# Patient Record
Sex: Female | Born: 1945 | Race: White | Hispanic: No | Marital: Married | Smoking: Former smoker
Health system: Southern US, Community
[De-identification: ages and names within clinical notes are randomized; demographics above are authoritative.]

## PROBLEM LIST (undated history)

## (undated) DIAGNOSIS — I499 Cardiac arrhythmia, unspecified: Secondary | ICD-10-CM

## (undated) DIAGNOSIS — E785 Hyperlipidemia, unspecified: Secondary | ICD-10-CM

## (undated) DIAGNOSIS — I4819 Other persistent atrial fibrillation: Secondary | ICD-10-CM

## (undated) DIAGNOSIS — G4733 Obstructive sleep apnea (adult) (pediatric): Secondary | ICD-10-CM

## (undated) DIAGNOSIS — F329 Major depressive disorder, single episode, unspecified: Secondary | ICD-10-CM

## (undated) DIAGNOSIS — N951 Menopausal and female climacteric states: Secondary | ICD-10-CM

## (undated) DIAGNOSIS — F32A Depression, unspecified: Secondary | ICD-10-CM

## (undated) DIAGNOSIS — I509 Heart failure, unspecified: Secondary | ICD-10-CM

## (undated) DIAGNOSIS — I10 Essential (primary) hypertension: Secondary | ICD-10-CM

## (undated) HISTORY — DX: Menopausal and female climacteric states: N95.1

## (undated) HISTORY — DX: Cardiac arrhythmia, unspecified: I49.9

## (undated) HISTORY — DX: Other persistent atrial fibrillation: I48.19

## (undated) HISTORY — DX: Obstructive sleep apnea (adult) (pediatric): G47.33

## (undated) HISTORY — DX: Heart failure, unspecified: I50.9

---
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
## 1970-06-19 HISTORY — PX: APPENDECTOMY: SHX54

## 1990-06-19 DIAGNOSIS — N951 Menopausal and female climacteric states: Secondary | ICD-10-CM

## 1990-06-19 HISTORY — DX: Menopausal and female climacteric states: N95.1

## 2005-12-19 ENCOUNTER — Emergency Department: Payer: Self-pay | Admitting: Emergency Medicine

## 2006-02-28 ENCOUNTER — Ambulatory Visit: Payer: Self-pay | Admitting: Family Medicine

## 2006-03-06 ENCOUNTER — Ambulatory Visit: Payer: Self-pay | Admitting: Family Medicine

## 2009-06-10 ENCOUNTER — Ambulatory Visit: Payer: Self-pay | Admitting: Family

## 2011-04-10 ENCOUNTER — Ambulatory Visit: Payer: Self-pay | Admitting: Internal Medicine

## 2011-05-08 ENCOUNTER — Ambulatory Visit (INDEPENDENT_AMBULATORY_CARE_PROVIDER_SITE_OTHER): Payer: Medicare Other | Admitting: Internal Medicine

## 2011-05-08 ENCOUNTER — Encounter: Payer: Self-pay | Admitting: Internal Medicine

## 2011-05-08 DIAGNOSIS — Z1239 Encounter for other screening for malignant neoplasm of breast: Secondary | ICD-10-CM

## 2011-05-08 DIAGNOSIS — Z124 Encounter for screening for malignant neoplasm of cervix: Secondary | ICD-10-CM

## 2011-05-08 DIAGNOSIS — R928 Other abnormal and inconclusive findings on diagnostic imaging of breast: Secondary | ICD-10-CM | POA: Insufficient documentation

## 2011-05-08 DIAGNOSIS — M130 Polyarthritis, unspecified: Secondary | ICD-10-CM

## 2011-05-08 DIAGNOSIS — Z1211 Encounter for screening for malignant neoplasm of colon: Secondary | ICD-10-CM | POA: Insufficient documentation

## 2011-05-08 DIAGNOSIS — Z13220 Encounter for screening for lipoid disorders: Secondary | ICD-10-CM

## 2011-05-08 DIAGNOSIS — N951 Menopausal and female climacteric states: Secondary | ICD-10-CM

## 2011-05-08 DIAGNOSIS — R5381 Other malaise: Secondary | ICD-10-CM

## 2011-05-08 DIAGNOSIS — R5383 Other fatigue: Secondary | ICD-10-CM

## 2011-05-08 NOTE — Patient Instructions (Addendum)
Try taking 2 ibuprofen and 1 or 2 tylenol 1 hour before bedtinme for your joint pain and suspend your turmeric to see if the hot flashes improve.  You can repeat this regimen 2 or 3 times daily depending on the strength of tylenol (Max dose of tylenol is 2 grams daily or  2000 mg ) .    Return for fasting labs at [facility].  Will are setting up your mammogram . GI referral and annual physical (after January)  Please go to [place] at [facility] for your chest x ray tomorrow after 2 pm

## 2011-05-08 NOTE — Progress Notes (Signed)
  Subjective:    Patient ID: Melissa Bonilla, female    DOB: 26-Feb-1946, 65 y.o.   MRN: [id number]  HPI 65 yo white female here to establish care. Cc is sudden recurrence of hot flashes at age 75 after a 20 yr period without any symptoms.,  Took HRT in the early 90s for 2 yrs.   Quit smoking in 1992.  Last colonscopy 2001 was normal, no TB exposure  Starts in arms  Facial flushes,  Sometimes brought on by eating hot and spicy food.  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  Taking turmeric which is helping  Her sleep at night.  Pain is gone by mornign    Review of Systems  Constitutional: Negative for fever, chills and unexpected weight change.  HENT: Negative for hearing loss, ear pain, nosebleeds, congestion, sore throat, facial swelling, rhinorrhea, sneezing, mouth sores, trouble swallowing, neck pain, neck stiffness, voice change, postnasal drip, sinus pressure, tinnitus and ear discharge.   Eyes: Negative for pain, discharge, redness and visual disturbance.  Respiratory: Negative for cough, chest tightness, shortness of breath, wheezing and stridor.   Cardiovascular: Negative for chest pain, palpitations and leg swelling.  Musculoskeletal: Negative for myalgias and arthralgias.  Skin: Negative for color change and rash.  Neurological: Negative for dizziness, weakness, light-headedness and headaches.  Hematological: Negative for adenopathy.      BP 102/66  Pulse 77  Temp(Src) 98.4 F (36.9 C) (Oral)  Resp 16  Ht 4\' 11"  (1.499 m)  Wt 155 lb 8 oz (70.534 kg)  BMI 31.41 kg/m2  SpO2 97%  Objective:   Physical Exam  Constitutional: She is oriented to person, place, and time. She appears well-developed and well-nourished.  HENT:  Mouth/Throat: Oropharynx is clear and moist.  Eyes: EOM are normal. Pupils are equal, round, and reactive to light. No scleral icterus.  Neck: Normal  range of motion. Neck supple. No JVD present. No thyromegaly present.  Cardiovascular: Normal rate, regular rhythm, normal heart sounds and intact distal pulses.   Pulmonary/Chest: Effort normal and breath sounds normal.  Abdominal: Soft. Bowel sounds are normal. She exhibits no mass. There is no tenderness.  Musculoskeletal: Normal range of motion. She exhibits no edema.  Lymphadenopathy:    She has no cervical adenopathy.  Neurological: She is alert and oriented to person, place, and time.  Skin: Skin is warm and dry.  Psychiatric: She has a normal mood and affect.          Assessment & Plan:  Hot Flashes:  ddx included menopause, carcinoid syndrome, lymphoma, and TB.  Chest ray ,  Labs and screenign colonoscopy ordered.  Polyarthralgias:  Ddx includes OA and RA ; given strong FH of RA,  Will odered screening serologies.

## 2011-05-09 ENCOUNTER — Ambulatory Visit (INDEPENDENT_AMBULATORY_CARE_PROVIDER_SITE_OTHER)
Admission: RE | Admit: 2011-05-09 | Discharge: 2011-05-09 | Disposition: A | Discharge status: Home / Self Care | Payer: Medicare Other | Source: Ambulatory Visit | Attending: Internal Medicine | Admitting: Internal Medicine

## 2011-05-09 DIAGNOSIS — N951 Menopausal and female climacteric states: Secondary | ICD-10-CM

## 2011-05-29 ENCOUNTER — Other Ambulatory Visit (INDEPENDENT_AMBULATORY_CARE_PROVIDER_SITE_OTHER): Payer: Medicare Other | Admitting: *Deleted

## 2011-05-29 DIAGNOSIS — R5383 Other fatigue: Secondary | ICD-10-CM

## 2011-05-29 DIAGNOSIS — M130 Polyarthritis, unspecified: Secondary | ICD-10-CM

## 2011-05-29 DIAGNOSIS — R5381 Other malaise: Secondary | ICD-10-CM

## 2011-05-29 DIAGNOSIS — Z13220 Encounter for screening for lipoid disorders: Secondary | ICD-10-CM

## 2011-05-29 LAB — CBC WITH DIFFERENTIAL/PLATELET
Basophils Absolute: 0 10*3/uL (ref 0.0–0.1)
Basophils Relative: 0.6 % (ref 0.0–3.0)
Eosinophils Absolute: 0.2 10*3/uL (ref 0.0–0.7)
Eosinophils Relative: 4.5 % (ref 0.0–5.0)
HCT: 39.6 % (ref 36.0–46.0)
Hemoglobin: 13.7 g/dL (ref 12.0–15.0)
Lymphocytes Relative: 48.3 % — ABNORMAL HIGH (ref 12.0–46.0)
Lymphs Abs: 2.6 10*3/uL (ref 0.7–4.0)
MCHC: 34.6 g/dL (ref 30.0–36.0)
MCV: 90.1 fl (ref 78.0–100.0)
Monocytes Absolute: 0.4 10*3/uL (ref 0.1–1.0)
Monocytes Relative: 7.7 % (ref 3.0–12.0)
Neutro Abs: 2.1 10*3/uL (ref 1.4–7.7)
Neutrophils Relative %: 38.9 % — ABNORMAL LOW (ref 43.0–77.0)
Platelets: 222 10*3/uL (ref 150.0–400.0)
RBC: 4.4 Mil/uL (ref 3.87–5.11)
RDW: 12.5 % (ref 11.5–14.6)
WBC: 5.4 10*3/uL (ref 4.5–10.5)

## 2011-05-29 LAB — COMPREHENSIVE METABOLIC PANEL
ALT: 27 U/L (ref 0–35)
AST: 24 U/L (ref 0–37)
Albumin: 4.1 g/dL (ref 3.5–5.2)
Alkaline Phosphatase: 90 U/L (ref 39–117)
BUN: 15 mg/dL (ref 6–23)
CO2: 24 mEq/L (ref 19–32)
Calcium: 8.9 mg/dL (ref 8.4–10.5)
Chloride: 106 mEq/L (ref 96–112)
Creatinine, Ser: 0.7 mg/dL (ref 0.4–1.2)
GFR: 89.2 mL/min (ref >60.00)
Glucose, Bld: 100 mg/dL — ABNORMAL HIGH (ref 70–99)
Potassium: 4.1 mEq/L (ref 3.5–5.1)
Sodium: 138 mEq/L (ref 135–145)
Total Bilirubin: 0.7 mg/dL (ref 0.3–1.2)
Total Protein: 6.9 g/dL (ref 6.0–8.3)

## 2011-05-29 LAB — LIPID PANEL
Cholesterol: 217 mg/dL — ABNORMAL HIGH (ref 0–200)
HDL: 47.9 mg/dL (ref >39.00)
Total CHOL/HDL Ratio: 5
Triglycerides: 126 mg/dL (ref 0.0–149.0)
VLDL: 25.2 mg/dL (ref 0.0–40.0)

## 2011-05-29 LAB — LDL CHOLESTEROL, DIRECT: Direct LDL: 147.4 mg/dL

## 2011-05-29 LAB — TSH: TSH: 1.03 u[IU]/mL (ref 0.35–5.50)

## 2011-05-30 LAB — RHEUMATOID FACTOR: Rhuematoid fact SerPl-aCnc: <10 IU/mL (ref <=14)

## 2011-05-30 LAB — ANA: Anti Nuclear Antibody(ANA): NEGATIVE

## 2011-05-30 LAB — C-REACTIVE PROTEIN: CRP: 0.23 mg/dL (ref <0.60)

## 2011-06-12 LAB — HM MAMMOGRAPHY: HM Mammogram: NORMAL

## 2011-07-13 ENCOUNTER — Ambulatory Visit: Payer: Self-pay | Admitting: Internal Medicine

## 2011-07-13 LAB — HM MAMMOGRAPHY

## 2011-07-13 LAB — HM COLONOSCOPY: HM Colonoscopy: NORMAL

## 2011-08-11 ENCOUNTER — Ambulatory Visit: Payer: Self-pay | Admitting: Unknown Physician Specialty

## 2011-08-11 LAB — HM COLONOSCOPY: HM Colonoscopy: NORMAL

## 2011-08-24 ENCOUNTER — Encounter: Payer: Self-pay | Admitting: Internal Medicine

## 2011-08-29 ENCOUNTER — Encounter: Payer: Self-pay | Admitting: Internal Medicine

## 2011-10-19 LAB — HM PAP SMEAR: HM Pap smear: NORMAL

## 2011-10-19 LAB — HM MAMMOGRAPHY: HM Mammogram: NORMAL

## 2011-11-10 ENCOUNTER — Encounter: Payer: Self-pay | Admitting: Internal Medicine

## 2011-11-10 ENCOUNTER — Ambulatory Visit (INDEPENDENT_AMBULATORY_CARE_PROVIDER_SITE_OTHER): Payer: Medicare Other | Admitting: Internal Medicine

## 2011-11-10 ENCOUNTER — Other Ambulatory Visit (HOSPITAL_COMMUNITY)
Admission: RE | Admit: 2011-11-10 | Discharge: 2011-11-10 | Disposition: A | Discharge status: Home / Self Care | Payer: Medicare Other | Source: Ambulatory Visit | Attending: Internal Medicine | Admitting: Internal Medicine

## 2011-11-10 VITALS — BP 137/81 | HR 74 | Temp 97.7°F | Resp 16 | Wt 161.2 lb

## 2011-11-10 DIAGNOSIS — Z124 Encounter for screening for malignant neoplasm of cervix: Secondary | ICD-10-CM

## 2011-11-10 DIAGNOSIS — N390 Urinary tract infection, site not specified: Secondary | ICD-10-CM

## 2011-11-10 DIAGNOSIS — R351 Nocturia: Secondary | ICD-10-CM

## 2011-11-10 LAB — POCT URINALYSIS DIPSTICK
Bilirubin, UA: NEGATIVE
Glucose, UA: NEGATIVE
Ketones, UA: NEGATIVE
Nitrite, UA: NEGATIVE
Protein, UA: NEGATIVE
Spec Grav, UA: <=1.005
Urobilinogen, UA: 0.2
pH, UA: 5.5

## 2011-11-10 MED ORDER — CIPROFLOXACIN HCL 250 MG PO TABS: 250.0000 mg | ORAL_TABLET | Freq: Two times a day (BID) | ORAL | Status: AC

## 2011-11-10 MED ORDER — ZOSTER VACCINE LIVE 19400 UNT/0.65ML ~~LOC~~ SOLR: 0.6500 mL | Freq: Once | SUBCUTANEOUS | Status: AC

## 2011-11-10 NOTE — Progress Notes (Signed)
Patient Active Problem List  Diagnoses  . Screening for breast cancer  . Screening for cervical cancer  . Screening for colon cancer  . UTI (lower urinary tract infection)    Subjective:  CC:   Chief Complaint  Patient presents with  . Urinary Incontinence  . Immunizations    Discuss Zostavax    HPI:   [name] a 66 y.o. female who presents Urinary urgency with urge incontinence,  Nocturia x 3 .  Has been occurring for several weeks. No nausea, suprapubic pain, fevers, chills or backache.    Past Medical History  Diagnosis Date  . Menopausal syndrome (hot flashes) 1992    used HRT fo 2 yrs     Past Surgical History  Procedure Date  . Cesarean section     1967, [id number]  . Appendectomy 1972         The following portions of the patient's history were reviewed and updated as appropriate: Allergies, current medications, and problem list.    Review of Systems:   12 Pt  review of systems was negative except those addressed in the HPI,     History   Social History  . Marital Status: Married    Spouse Name: N/A    Number of Children: N/A  . Years of Education: N/A   Occupational History  .      Owner of a International aid/development worker   Social History Main Topics  . Smoking status: Former Smoker    Quit date: 05/08/1991  . Smokeless tobacco: Never Used  . Alcohol Use: Yes  . Drug Use: No  . Sexually Active: Not on file   Other Topics Concern  . Not on file   Social History Narrative  . No narrative on file    Objective:  BP 137/81  Pulse 74  Temp(Src) 97.7 F (36.5 C) (Oral)  Resp 16  Wt 161 lb 4 oz (73.143 kg)  SpO2 98%  General appearance: alert, cooperative and appears stated age Ears: normal TM's and external ear canals both ears Throat: lips, mucosa, and tongue normal; teeth and gums normal Neck: no adenopathy, no carotid bruit, supple, symmetrical, trachea midline and thyroid not enlarged, symmetric, no tenderness/mass/nodules Back:  symmetric, no curvature. ROM normal. No CVA tenderness. Lungs: clear to auscultation bilaterally Breasts: symmetric, no masses, dimples or nipple discharge Heart: regular rate and rhythm, S1, S2 normal, no murmur, click, rub or gallop Abdomen: soft, non-tender; bowel sounds normal; no masses,  no organomegaly GYN: normal external genitalia, normal cervix, ovaries nonpalpable Pulses: 2+ and symmetric Skin: Skin color, texture, turgor normal. No rashes or lesions Lymph nodes: Cervical, supraclavicular, and axillary nodes normal.  Assessment and Plan:  Screening for cervical cancer Last PAP was over 4  Years ago and was done today.  UTI (lower urinary tract infection) Empiric ciprofloxacin prescribed for abnormal UA> culture pending.     Updated Medication List Outpatient Encounter Prescriptions as of 11/10/2011  Medication Sig Dispense Refill  . ciprofloxacin (CIPRO) 250 MG tablet Take 1 tablet (250 mg total) by mouth 2 (two) times daily.  6 tablet  0  . zoster vaccine live, PF, (ZOSTAVAX) 11914 UNT/0.65ML injection Inject 19,400 Units into the skin once.  1 vial  0     Orders Placed This Encounter  Procedures  . CULTURE, URINE COMPREHENSIVE  . HM MAMMOGRAPHY  . POCT urinalysis dipstick  . HM COLONOSCOPY    No Follow-up on file.       Patient ID:  Melissa Bonilla, female   DOB: [birth year], 66 y.o.   MRN: [id number]

## 2011-11-13 DIAGNOSIS — N390 Urinary tract infection, site not specified: Secondary | ICD-10-CM | POA: Insufficient documentation

## 2011-11-13 NOTE — Assessment & Plan Note (Signed)
Last PAP was over 4  Years ago and was done today.

## 2011-11-13 NOTE — Assessment & Plan Note (Signed)
Empiric ciprofloxacin prescribed for abnormal UA> culture pending.

## 2011-11-14 ENCOUNTER — Other Ambulatory Visit: Payer: Self-pay | Admitting: Internal Medicine

## 2011-11-17 LAB — CULTURE, URINE COMPREHENSIVE: Colony Count: 3000

## 2011-11-20 ENCOUNTER — Telehealth: Payer: Self-pay | Admitting: Internal Medicine

## 2011-11-20 MED ORDER — FESOTERODINE FUMARATE ER 4 MG PO TB24: 4.0000 mg | ORAL_TABLET | Freq: Every day | ORAL | Status: DC

## 2011-11-20 NOTE — Telephone Encounter (Signed)
Patient called in and states that she was giving samples of Toviaz 4 mg 1 x a day she uses [facility] she would like a prescription of this called in.

## 2011-11-20 NOTE — Telephone Encounter (Signed)
Rx called in 

## 2011-11-21 ENCOUNTER — Encounter: Payer: Self-pay | Admitting: Internal Medicine

## 2012-04-04 ENCOUNTER — Other Ambulatory Visit: Payer: Self-pay | Admitting: *Deleted

## 2012-04-04 MED ORDER — FESOTERODINE FUMARATE ER 4 MG PO TB24: 4.0000 mg | ORAL_TABLET | Freq: Every day | ORAL | Status: DC

## 2012-04-04 NOTE — Telephone Encounter (Signed)
R'cd fax from [facility] for refill of Toviaz.

## 2012-04-08 ENCOUNTER — Other Ambulatory Visit: Payer: Self-pay | Admitting: Internal Medicine

## 2012-04-08 NOTE — Telephone Encounter (Signed)
Spoke with Selena Batten at [facility] and was advised that Rx was sent in electronically on 04/04/2012 but it is on hold because last Rx was refilled on 03/13/2012.  Selena Batten stated that patient can call back and have Rx filled within a few days.

## 2012-04-08 NOTE — Telephone Encounter (Signed)
Patient advised via telephone

## 2012-04-08 NOTE — Telephone Encounter (Signed)
Pt called checking on refill request.  Pt stated she called [facility] on Friday for this refill toviaz 4mg  Pt has 1 pill left Please advise when this is called in

## 2012-08-16 ENCOUNTER — Encounter: Payer: Self-pay | Admitting: Internal Medicine

## 2012-08-16 ENCOUNTER — Ambulatory Visit (INDEPENDENT_AMBULATORY_CARE_PROVIDER_SITE_OTHER): Payer: Medicare Other | Admitting: Internal Medicine

## 2012-08-16 VITALS — BP 130/82 | HR 74 | Temp 98.0°F | Resp 16 | Ht 60.0 in | Wt 165.0 lb

## 2012-08-16 DIAGNOSIS — G5710 Meralgia paresthetica, unspecified lower limb: Secondary | ICD-10-CM

## 2012-08-16 DIAGNOSIS — M79609 Pain in unspecified limb: Secondary | ICD-10-CM

## 2012-08-16 DIAGNOSIS — M25559 Pain in unspecified hip: Secondary | ICD-10-CM

## 2012-08-16 DIAGNOSIS — M545 Low back pain, unspecified: Secondary | ICD-10-CM

## 2012-08-16 DIAGNOSIS — Z124 Encounter for screening for malignant neoplasm of cervix: Secondary | ICD-10-CM

## 2012-08-16 DIAGNOSIS — Z1239 Encounter for other screening for malignant neoplasm of breast: Secondary | ICD-10-CM

## 2012-08-16 DIAGNOSIS — G5712 Meralgia paresthetica, left lower limb: Secondary | ICD-10-CM

## 2012-08-16 DIAGNOSIS — Z0000 Encounter for general adult medical examination without abnormal findings: Secondary | ICD-10-CM

## 2012-08-16 DIAGNOSIS — M25551 Pain in right hip: Secondary | ICD-10-CM

## 2012-08-16 DIAGNOSIS — E785 Hyperlipidemia, unspecified: Secondary | ICD-10-CM

## 2012-08-16 DIAGNOSIS — Z79899 Other long term (current) drug therapy: Secondary | ICD-10-CM

## 2012-08-16 DIAGNOSIS — M79605 Pain in left leg: Secondary | ICD-10-CM

## 2012-08-16 DIAGNOSIS — Z1211 Encounter for screening for malignant neoplasm of colon: Secondary | ICD-10-CM

## 2012-08-16 LAB — COMPREHENSIVE METABOLIC PANEL
ALT: 31 U/L (ref 0–35)
AST: 23 U/L (ref 0–37)
Albumin: 4.1 g/dL (ref 3.5–5.2)
Alkaline Phosphatase: 95 U/L (ref 39–117)
BUN: 17 mg/dL (ref 6–23)
CO2: 29 mEq/L (ref 19–32)
Calcium: 9.4 mg/dL (ref 8.4–10.5)
Chloride: 101 mEq/L (ref 96–112)
Creatinine, Ser: 0.8 mg/dL (ref 0.4–1.2)
GFR: 80.82 mL/min (ref >60.00)
Glucose, Bld: 87 mg/dL (ref 70–99)
Potassium: 4.9 mEq/L (ref 3.5–5.1)
Sodium: 137 mEq/L (ref 135–145)
Total Bilirubin: 0.6 mg/dL (ref 0.3–1.2)
Total Protein: 6.7 g/dL (ref 6.0–8.3)

## 2012-08-16 LAB — LDL CHOLESTEROL, DIRECT: Direct LDL: 171.8 mg/dL

## 2012-08-16 MED ORDER — GABAPENTIN 300 MG PO CAPS: 300.0000 mg | ORAL_CAPSULE | Freq: Three times a day (TID) | ORAL | Status: DC

## 2012-08-16 MED ORDER — OXYBUTYNIN CHLORIDE ER 5 MG PO TB24: 5.0000 mg | ORAL_TABLET | Freq: Every day | ORAL | Status: DC

## 2012-08-16 NOTE — Patient Instructions (Addendum)
I am referring you to Dr Martha Clan for evaluation of your right hip.  Plain x rays of left tibia when you get your mammogram ([facility] will set up)  Trial of gabapentin at bedtime for burning pain in thigh.  You may take it 3 times daily if it helps or if it is needed,  It can be combined with ibuprofen   oxybutynin sent to pharmacy

## 2012-08-16 NOTE — Progress Notes (Signed)
Patient ID: Melissa Bonilla, female   DOB: [birth year], 67 y.o.   MRN: [id number]  The patient is here for annual Medicare wellness examination and management of other chronic and acute problems.   persisent neuropathic pain on the right thigh which started in the knee and has progressed to the thigh.  She describes the pain as a burning sensation. She is also been having recurrent daily back pain which is been treating with ibuprofen once daily.,    2nd issue: history of 3 months of left tibial pian Which occurred nocturnally on a regular basis and was so painful it would wake her up with a deep aching in the bone and behind her knees. It is been resolved for several weeks now.    The risk factors are reflected in the social history.  The roster of all physicians providing medical care to patient - is listed in the Snapshot section of the chart.  Activities of daily living:  The patient is 100% independent in all ADLs: dressing, toileting, feeding as well as independent mobility  Home safety : The patient has smoke detectors in the home. They wear seatbelts.  There are no firearms at home. There is no violence in the home.   There is no risks for hepatitis, STDs or HIV. There is no   history of blood transfusion. They have no travel history to infectious disease endemic areas of the world.  The patient has seen their dentist in the last six month. They have seen their eye doctor in the last year. They admit to slight hearing difficulty with regard to whispered voices and some television programs.  They have deferred audiologic testing in the last year.  They do not  have excessive sun exposure. Discussed the need for sun protection: hats, long sleeves and use of sunscreen if there is significant sun exposure.   Diet: the importance of a healthy diet is discussed. They do have a healthy diet.  The benefits of regular aerobic exercise were discussed. She walks 4 times per week ,  20 minutes.    Depression screen: there are no signs or vegative symptoms of depression- irritability, change in appetite, anhedonia, sadness/tearfullness.  Cognitive assessment: the patient manages all their financial and personal affairs and is actively engaged. They could relate day,date,year and events; recalled 2/3 objects at 3 minutes; performed clock-face test normally.  The following portions of the patient's history were reviewed and updated as appropriate: allergies, current medications, past family history, past medical history,  past surgical history, past social history  and problem list.  Visual acuity was not assessed per patient preference since she has regular follow up with her ophthalmologist. Hearing and body mass index were assessed and reviewed.   During the course of the visit the patient was educated and counseled about appropriate screening and preventive services including : fall prevention , diabetes screening, nutrition counseling, colorectal cancer screening, and recommended immunizations.    Objective  BP 130/82  Pulse 74  Temp(Src) 98 F (36.7 C) (Oral)  Resp 16  Ht 5' (1.524 m)  Wt 165 lb (74.844 kg)  BMI 32.22 kg/m2  SpO2 98%  General appearance: alert, cooperative and appears stated age Ears: normal TM's and external ear canals both ears Throat: lips, mucosa, and tongue normal; teeth and gums normal Neck: no adenopathy, no carotid bruit, supple, symmetrical, trachea midline and thyroid not enlarged, symmetric, no tenderness/mass/nodules Back: symmetric, no curvature. ROM normal. No CVA tenderness. Lungs: clear to auscultation bilaterally  Heart: regular rate and rhythm, S1, S2 normal, no murmur, click, rub or gallop Abdomen: soft, non-tender; bowel sounds normal; no masses,  no organomegaly Pulses: 2+ and symmetric Skin: Skin color, texture, turgor normal. No rashes or lesions Lymph nodes: Cervical, supraclavicular, and axillary nodes normal.  Assessment and  Plan  Lateral femoral cutaneous neuropathy Right thigh, Suggested by history. No sensory deficits on exam. Trial of gabapentin.  Screening for cervical cancer Pap smear was normal in May 2013.  Screening for breast cancer Repeat mammogram has been ordered and breast exam has been done.  Screening for colon cancer She is up-to-date on screening with her last colonoscopy being done in February 2013.  It was normal.,   Lumbago She has persistent low back pain which is nonradiating and right hip pain which is aggravated by internal and external rotation on today's exam. Referral to Juanell Fairly for evaluation of her pain.  Routine general medical examination at a health care facility Annual exam excluding breast , pelvic and PAP smear was done today. She was brought up to date on all screenings.   Updated Medication List Outpatient Encounter Prescriptions as of 08/16/2012  Medication Sig Dispense Refill  . [DISCONTINUED] fesoterodine (TOVIAZ) 4 MG TB24 Take 1 tablet (4 mg total) by mouth daily.  30 tablet  3  . gabapentin (NEURONTIN) 300 MG capsule Take 1 capsule (300 mg total) by mouth 3 (three) times daily.  90 capsule  3  . oxybutynin (DITROPAN-XL) 5 MG 24 hr tablet Take 1 tablet (5 mg total) by mouth daily.  30 tablet  11   No facility-administered encounter medications on file as of 08/16/2012.

## 2012-08-18 ENCOUNTER — Encounter: Payer: Self-pay | Admitting: Internal Medicine

## 2012-08-18 DIAGNOSIS — M545 Low back pain, unspecified: Secondary | ICD-10-CM | POA: Insufficient documentation

## 2012-08-18 DIAGNOSIS — G5710 Meralgia paresthetica, unspecified lower limb: Secondary | ICD-10-CM | POA: Insufficient documentation

## 2012-08-18 DIAGNOSIS — Z0000 Encounter for general adult medical examination without abnormal findings: Secondary | ICD-10-CM | POA: Insufficient documentation

## 2012-08-18 NOTE — Assessment & Plan Note (Signed)
She is up-to-date on screening with her last colonoscopy being done in February 2013 normal.,

## 2012-08-18 NOTE — Assessment & Plan Note (Signed)
Pap smear was normal in May 2013.

## 2012-08-18 NOTE — Assessment & Plan Note (Signed)
Annual exam excluding breast , pelvic and PAP smear was done today. She was brought up to date on all screenings.

## 2012-08-18 NOTE — Assessment & Plan Note (Signed)
Repeat mammogram has been ordered and breast exam has been done.

## 2012-08-18 NOTE — Assessment & Plan Note (Addendum)
Right thigh, Suggested by history. No sensory deficits on exam. Trial of gabapentin.

## 2012-08-18 NOTE — Assessment & Plan Note (Signed)
She has persistent low back pain which is nonradiating and right hip pain which is aggravated by internal and external rotation on today's exam. Referral to Juanell Fairly for evaluation of her pain.

## 2012-08-22 ENCOUNTER — Ambulatory Visit: Payer: Self-pay | Admitting: Internal Medicine

## 2012-08-27 ENCOUNTER — Telehealth: Payer: Self-pay | Admitting: Internal Medicine

## 2012-08-27 NOTE — Telephone Encounter (Signed)
X ray of her lower leg was normal

## 2012-08-28 NOTE — Telephone Encounter (Signed)
Pt notified of results

## 2012-09-13 ENCOUNTER — Encounter: Payer: Self-pay | Admitting: Internal Medicine

## 2012-09-25 ENCOUNTER — Ambulatory Visit: Payer: Self-pay | Admitting: Internal Medicine

## 2012-10-24 ENCOUNTER — Encounter: Payer: Self-pay | Admitting: Internal Medicine

## 2013-02-27 ENCOUNTER — Other Ambulatory Visit: Payer: Medicare Other

## 2013-03-18 ENCOUNTER — Telehealth: Payer: Self-pay | Admitting: *Deleted

## 2013-03-18 DIAGNOSIS — E785 Hyperlipidemia, unspecified: Secondary | ICD-10-CM

## 2013-03-18 NOTE — Telephone Encounter (Signed)
Pt is coming in for labs tomorrow what labs and dx?  

## 2013-03-19 ENCOUNTER — Other Ambulatory Visit (INDEPENDENT_AMBULATORY_CARE_PROVIDER_SITE_OTHER): Payer: Medicare Other

## 2013-03-19 DIAGNOSIS — E785 Hyperlipidemia, unspecified: Secondary | ICD-10-CM

## 2013-03-19 LAB — LIPID PANEL
Cholesterol: 227 mg/dL — ABNORMAL HIGH (ref 0–200)
HDL: 44.4 mg/dL (ref >39.00)
Total CHOL/HDL Ratio: 5
Triglycerides: 106 mg/dL (ref 0.0–149.0)
VLDL: 21.2 mg/dL (ref 0.0–40.0)

## 2013-03-19 LAB — COMPREHENSIVE METABOLIC PANEL
ALT: 20 U/L (ref 0–35)
AST: 19 U/L (ref 0–37)
Albumin: 4 g/dL (ref 3.5–5.2)
Alkaline Phosphatase: 76 U/L (ref 39–117)
BUN: 13 mg/dL (ref 6–23)
CO2: 27 mEq/L (ref 19–32)
Calcium: 9.1 mg/dL (ref 8.4–10.5)
Chloride: 105 mEq/L (ref 96–112)
Creatinine, Ser: 0.8 mg/dL (ref 0.4–1.2)
GFR: 74.96 mL/min (ref >60.00)
Glucose, Bld: 91 mg/dL (ref 70–99)
Potassium: 5.1 mEq/L (ref 3.5–5.1)
Sodium: 139 mEq/L (ref 135–145)
Total Bilirubin: 0.8 mg/dL (ref 0.3–1.2)
Total Protein: 6.8 g/dL (ref 6.0–8.3)

## 2013-03-21 LAB — LDL CHOLESTEROL, DIRECT: Direct LDL: 175.5 mg/dL

## 2013-03-21 LAB — TSH: TSH: 1.56 u[IU]/mL (ref 0.35–5.50)

## 2013-03-27 ENCOUNTER — Encounter: Payer: Self-pay | Admitting: *Deleted

## 2013-05-12 ENCOUNTER — Telehealth: Payer: Self-pay | Admitting: Internal Medicine

## 2013-05-12 NOTE — Telephone Encounter (Signed)
Offered appointment refused. Would like something for nerves  Taking care of mother and would like Clonazepam same medication she used when taking care of her father please advise.

## 2013-05-12 NOTE — Telephone Encounter (Signed)
I cannot prescribe benzodiazepine without an office visit,  (she was last seen February,  Not a couple of months ago), even if she has taken it several years ago.

## 2013-05-12 NOTE — Telephone Encounter (Signed)
Pt was seen and had labs done a couple months ago.  Pt wants something for nerves.  Is taking care of 67yo mother.  During her father's illness Dr. Adele Barthel gave her rx for clonazepam 0.5 mg 3 times a day.  Could not take it 3 times a day.  Asking for rx for same medication.  Will come in for appt if necessary but does not want make appt until she hears from Dr. Darrick Huntsman.  Is asking for the medication before the holidays.  [facility]

## 2013-05-13 NOTE — Telephone Encounter (Signed)
Patient scheduled for 05/26/13

## 2013-05-26 ENCOUNTER — Ambulatory Visit (INDEPENDENT_AMBULATORY_CARE_PROVIDER_SITE_OTHER): Payer: Medicare Other | Admitting: Internal Medicine

## 2013-05-26 ENCOUNTER — Encounter: Payer: Self-pay | Admitting: Internal Medicine

## 2013-05-26 VITALS — BP 134/78 | HR 65 | Temp 98.0°F | Resp 12 | Ht 60.0 in | Wt 154.8 lb

## 2013-05-26 DIAGNOSIS — F411 Generalized anxiety disorder: Secondary | ICD-10-CM

## 2013-05-26 DIAGNOSIS — G5710 Meralgia paresthetica, unspecified lower limb: Secondary | ICD-10-CM

## 2013-05-26 MED ORDER — GABAPENTIN 300 MG PO CAPS: 300.0000 mg | ORAL_CAPSULE | Freq: Three times a day (TID) | ORAL | Status: DC

## 2013-05-26 MED ORDER — ESCITALOPRAM OXALATE 20 MG PO TABS: 20.0000 mg | ORAL_TABLET | Freq: Every day | ORAL | Status: DC

## 2013-05-26 NOTE — Progress Notes (Signed)
Patient ID: Melissa Bonilla, female   DOB: 14-Jun-1946, 67 y.o.   MRN: [id number]  Patient Active Problem List   Diagnosis Date Noted  . Generalized anxiety disorder 05/26/2013  . Lateral femoral cutaneous neuropathy 08/18/2012  . Lumbago 08/18/2012  . Routine general medical examination at a health care facility 08/18/2012  . Screening for breast cancer 05/08/2011  . Screening for cervical cancer 05/08/2011  . Screening for colon cancer 05/08/2011    Subjective:  CC:   Chief Complaint  Patient presents with  . Acute Visit  . Anxiety    HPI:   [name] a 67 y.o. female who presents Increased anxiety.  Stressors include ful time care for  independent but  ailing Mom who lives next door.  Finding herself snapping at her,  At husband..  Early wakening at 3 am ,  Thoughts filled with things Mom said that aggravated her. Finds herself drinking 2 or 3 glasses of wine on some nightHer mother has been resistant to assistance and contrary at tines,  She finds herself resenting her.  Still working full time .  Brother not involved or assisting so she has no time to herself to relax.  Increased anxiety.  Stressors include ful time care for  independent but  ailing Mom who lives next door.  Finding herself snapping at her,  At husband..  Early wakening at 3 am ,  Thoughts filled with things Mom said that aggravated her. Finds herself drinking 2 or 3 glasses of wine on some nightHer mother has been resistant to assistance and contrary at tines,  She finds herself resenting her.  Still working full time .  Brother not involved or assisting so she has no time to herself to relax.    Past Medical History  Diagnosis Date  . Menopausal syndrome (hot flashes) 1992    used HRT fo 2 yrs     Past Surgical History  Procedure Laterality Date  . Cesarean section      1967, [id number]  . Appendectomy  1972       The following portions of the patient's history were reviewed and updated as appropriate:  Allergies, current medications, and problem list.    Review of Systems:   12 Pt  review of systems was negative except those addressed in the HPI,     History   Social History  . Marital Status: Married    Spouse Name: N/A    Number of Children: N/A  . Years of Education: N/A   Occupational History  .      Owner of a International aid/development worker   Social History Main Topics  . Smoking status: Former Smoker    Quit date: 05/08/1991  . Smokeless tobacco: Never Used  . Alcohol Use: Yes  . Drug Use: No  . Sexual Activity: Not on file   Other Topics Concern  . Not on file   Social History Narrative  . No narrative on file    Objective:  Filed Vitals:   05/26/13 1140  BP: 134/78  Pulse: 65  Temp: 98 F (36.7 C)  Resp: 12     General appearance: alert, cooperative and appears stated age Ears: normal TM's and external ear canals both ears Throat: lips, mucosa, and tongue normal; teeth and gums normal Neck: no adenopathy, no carotid bruit, supple, symmetrical, trachea midline and thyroid not enlarged, symmetric, no tenderness/mass/nodules Back: symmetric, no curvature. ROM normal. No CVA tenderness. Lungs: clear to auscultation bilaterally Heart: regular rate  and rhythm, S1, S2 normal, no murmur, click, rub or gallop Abdomen: soft, non-tender; bowel sounds normal; no masses,  no organomegaly Pulses: 2+ and symmetric Skin: Skin color, texture, turgor normal. No rashes or lesions Lymph nodes: Cervical, supraclavicular, and axillary nodes normal.  Assessment and Plan:  Generalized anxiety disorder Trial of lexapro,  20 mg tablet prescribed  Start with 10 mg daily and increase as tolerated ot 20 mg daily.  Avoiding benzos given use of daily alcohol. Advised to discuss situation with brother to afford her an afternoon off per week to tend to her own needs.  including exercise   Lateral femoral cutaneous neuropathy Improved with gabapentin.   A total of 25 minutes was  spent with patient more than half of which was spent in counseling, reviewing records from other prviders and coordination of care.  Updated Medication List Outpatient Encounter Prescriptions as of 05/26/2013  Medication Sig  . gabapentin (NEURONTIN) 300 MG capsule Take 1 capsule (300 mg total) by mouth 3 (three) times daily.  Marland Kitchen oxybutynin (DITROPAN-XL) 5 MG 24 hr tablet Take 1 tablet (5 mg total) by mouth daily.  . [DISCONTINUED] gabapentin (NEURONTIN) 300 MG capsule Take 1 capsule (300 mg total) by mouth 3 (three) times daily.  Marland Kitchen escitalopram (LEXAPRO) 20 MG tablet Take 1 tablet (20 mg total) by mouth daily.     No orders of the defined types were placed in this encounter.    No Follow-up on file.

## 2013-05-26 NOTE — Patient Instructions (Addendum)
I am starting you on generic Lexapro for your anxiety.  Please start with 1/2 tablet daily for the first few days and increase to full tablet once you are sure you are tolerating it  Generalized Anxiety Disorder Generalized anxiety disorder (GAD) is a mental disorder. It interferes with life functions, including relationships, work, and school. GAD is different from normal anxiety, which everyone experiences at some point in their lives in response to specific life events and activities. Normal anxiety actually helps [place] prepare for and get through these life events and activities. Normal anxiety goes away after the event or activity is over.  GAD causes anxiety that is not necessarily related to specific events or activities. It also causes excess anxiety in proportion to specific events or activities. The anxiety associated with GAD is also difficult to control. GAD can vary from mild to severe. People with severe GAD can have intense waves of anxiety with physical symptoms (panic attacks).  SYMPTOMS The anxiety and worry associated with GAD are difficult to control. This anxiety and worry are related to many life events and activities and also occur more days than not for 6 months or longer. People with GAD also have three or more of the following symptoms (one or more in children):  Restlessness.   Fatigue.  Difficulty concentrating.   Irritability.  Muscle tension.  Difficulty sleeping or unsatisfying sleep. DIAGNOSIS GAD is diagnosed through an assessment by your caregiver. Your caregiver will ask you questions aboutyour mood,physical symptoms, and events in your life. Your caregiver may ask you about your medical history and use of alcohol or drugs, including prescription medications. Your caregiver may also do a physical exam and blood tests. Certain medical conditions and the use of certain substances can cause symptoms similar to those associated with GAD. Your caregiver may refer  you to a mental health specialist for further evaluation. TREATMENT The following therapies are usually used to treat GAD:   Medication Antidepressant medication usually is prescribed for long-term daily control. Antianxiety medications may be added in severe cases, especially when panic attacks occur.   Talk therapy (psychotherapy) Certain types of talk therapy can be helpful in treating GAD by providing support, education, and guidance. A form of talk therapy called cognitive behavioral therapy can teach you healthy ways to think about and react to daily life events and activities.  Stress managementtechniques These include yoga, meditation, and exercise and can be very helpful when they are practiced regularly. A mental health specialist can help determine which treatment is best for you. Some people see improvement with one therapy. However, other people require a combination of therapies. Document Released: 09/30/2012 Document Reviewed: 09/30/2012 [facility] Patient Information 2014 [place], [place]. et daily for the first few days, and increase to full tablet when you are sure you are tolerating it.  If , after 2 weeks, you ares still having trouble with insomnia,  Let me know and I will prescribe something to help you get a better night's sleep  Please return after the holidays for fasting labs. To check your cholesterol.

## 2013-05-26 NOTE — Progress Notes (Signed)
Pre-visit discussion using our clinic review tool. No additional management support is needed unless otherwise documented below in the visit note.  

## 2013-05-26 NOTE — Assessment & Plan Note (Signed)
Trial of lexapro,  20 mg tablet prescribed  Start with 10 mg daily and increase as tolerated ot 20 mg daily.  Avoiding benzos given use of daily alcohol.

## 2013-05-26 NOTE — Assessment & Plan Note (Signed)
Improved with gabapentin. 

## 2013-09-10 ENCOUNTER — Telehealth: Payer: Self-pay | Admitting: *Deleted

## 2013-09-10 DIAGNOSIS — F411 Generalized anxiety disorder: Secondary | ICD-10-CM

## 2013-09-10 MED ORDER — ESCITALOPRAM OXALATE 20 MG PO TABS: 20.0000 mg | ORAL_TABLET | Freq: Every day | ORAL | Status: DC

## 2013-09-10 NOTE — Telephone Encounter (Signed)
Ok to refill,  Refill sent  

## 2013-09-10 NOTE — Telephone Encounter (Signed)
Patient called for refill on Lexapro Please advise. Last OV 12/14

## 2013-12-12 ENCOUNTER — Encounter: Payer: Medicare Other | Admitting: Internal Medicine

## 2014-01-12 ENCOUNTER — Encounter: Payer: Self-pay | Admitting: Internal Medicine

## 2014-01-12 ENCOUNTER — Ambulatory Visit (INDEPENDENT_AMBULATORY_CARE_PROVIDER_SITE_OTHER): Payer: Commercial Managed Care - HMO | Admitting: Internal Medicine

## 2014-01-12 VITALS — BP 140/70 | HR 60 | Temp 98.7°F | Resp 16 | Ht 60.0 in | Wt 156.2 lb

## 2014-01-12 DIAGNOSIS — M76899 Other specified enthesopathies of unspecified lower limb, excluding foot: Secondary | ICD-10-CM

## 2014-01-12 DIAGNOSIS — Z1239 Encounter for other screening for malignant neoplasm of breast: Secondary | ICD-10-CM

## 2014-01-12 DIAGNOSIS — M7071 Other bursitis of hip, right hip: Secondary | ICD-10-CM

## 2014-01-12 DIAGNOSIS — F411 Generalized anxiety disorder: Secondary | ICD-10-CM

## 2014-01-12 DIAGNOSIS — Z0000 Encounter for general adult medical examination without abnormal findings: Secondary | ICD-10-CM

## 2014-01-12 DIAGNOSIS — E785 Hyperlipidemia, unspecified: Secondary | ICD-10-CM

## 2014-01-12 DIAGNOSIS — E559 Vitamin D deficiency, unspecified: Secondary | ICD-10-CM

## 2014-01-12 DIAGNOSIS — R5383 Other fatigue: Secondary | ICD-10-CM

## 2014-01-12 DIAGNOSIS — R5381 Other malaise: Secondary | ICD-10-CM

## 2014-01-12 DIAGNOSIS — Z23 Encounter for immunization: Secondary | ICD-10-CM

## 2014-01-12 DIAGNOSIS — L989 Disorder of the skin and subcutaneous tissue, unspecified: Secondary | ICD-10-CM

## 2014-01-12 LAB — COMPREHENSIVE METABOLIC PANEL
ALT: 20 U/L (ref 0–35)
AST: 20 U/L (ref 0–37)
Albumin: 4 g/dL (ref 3.5–5.2)
Alkaline Phosphatase: 83 U/L (ref 39–117)
BUN: 14 mg/dL (ref 6–23)
CO2: 28 mEq/L (ref 19–32)
Calcium: 9.1 mg/dL (ref 8.4–10.5)
Chloride: 104 mEq/L (ref 96–112)
Creatinine, Ser: 0.8 mg/dL (ref 0.4–1.2)
GFR: 81.72 mL/min (ref >60.00)
Glucose, Bld: 107 mg/dL — ABNORMAL HIGH (ref 70–99)
Potassium: 5.1 mEq/L (ref 3.5–5.1)
Sodium: 138 mEq/L (ref 135–145)
Total Bilirubin: 0.5 mg/dL (ref 0.2–1.2)
Total Protein: 6.9 g/dL (ref 6.0–8.3)

## 2014-01-12 LAB — LIPID PANEL
Cholesterol: 234 mg/dL — ABNORMAL HIGH (ref 0–200)
HDL: 47.5 mg/dL (ref >39.00)
LDL Cholesterol: 161 mg/dL — ABNORMAL HIGH (ref 0–99)
NonHDL: 186.5
Total CHOL/HDL Ratio: 5
Triglycerides: 129 mg/dL (ref 0.0–149.0)
VLDL: 25.8 mg/dL (ref 0.0–40.0)

## 2014-01-12 LAB — CBC WITH DIFFERENTIAL/PLATELET
Basophils Absolute: 0 10*3/uL (ref 0.0–0.1)
Basophils Relative: 0.3 % (ref 0.0–3.0)
Eosinophils Absolute: 0.2 10*3/uL (ref 0.0–0.7)
Eosinophils Relative: 2.9 % (ref 0.0–5.0)
HCT: 41 % (ref 36.0–46.0)
Hemoglobin: 14.2 g/dL (ref 12.0–15.0)
Lymphocytes Relative: 46.5 % — ABNORMAL HIGH (ref 12.0–46.0)
Lymphs Abs: 2.7 10*3/uL (ref 0.7–4.0)
MCHC: 34.7 g/dL (ref 30.0–36.0)
MCV: 88.7 fl (ref 78.0–100.0)
Monocytes Absolute: 0.5 10*3/uL (ref 0.1–1.0)
Monocytes Relative: 8 % (ref 3.0–12.0)
Neutro Abs: 2.5 10*3/uL (ref 1.4–7.7)
Neutrophils Relative %: 42.3 % — ABNORMAL LOW (ref 43.0–77.0)
Platelets: 215 10*3/uL (ref 150.0–400.0)
RBC: 4.62 Mil/uL (ref 3.87–5.11)
RDW: 12.4 % (ref 11.5–15.5)
WBC: 5.9 10*3/uL (ref 4.0–10.5)

## 2014-01-12 LAB — VITAMIN D 25 HYDROXY (VIT D DEFICIENCY, FRACTURES): VITD: 26.04 ng/mL — ABNORMAL LOW (ref 30.00–100.00)

## 2014-01-12 LAB — TSH: TSH: 1.74 u[IU]/mL (ref 0.35–4.50)

## 2014-01-12 MED ORDER — TETANUS-DIPHTH-ACELL PERTUSSIS 5-2.5-18.5 LF-MCG/0.5 IM SUSP: 0.5000 mL | Freq: Once | INTRAMUSCULAR | Status: DC

## 2014-01-12 MED ORDER — GABAPENTIN 300 MG PO CAPS: 300.0000 mg | ORAL_CAPSULE | Freq: Three times a day (TID) | ORAL | Status: DC

## 2014-01-12 MED ORDER — OXYBUTYNIN CHLORIDE ER 5 MG PO TB24: 5.0000 mg | ORAL_TABLET | Freq: Every day | ORAL | Status: DC

## 2014-01-12 MED ORDER — ESCITALOPRAM OXALATE 20 MG PO TABS: 20.0000 mg | ORAL_TABLET | Freq: Every day | ORAL | Status: DC

## 2014-01-12 NOTE — Progress Notes (Signed)
Patient ID: Melissa Bonilla, female   DOB: September 22, 1945, 68 y.o.   MRN: [id number]  The patient is here for annual Medicare wellness examination and management of other chronic and acute problems.   'She received an intrarticular steroid injection in her right hip by Margaretmary Eddy  Last Fall which has improved her chronic hip pain but not resolved it.  She is using gabapentin prn.  And ibuprofen three times a week   The risk factors are reflected in the social history.  The roster of all physicians providing medical care to patient - is listed in the Snapshot section of the chart.  Activities of daily living:  The patient is 100% independent in all ADLs: dressing, toileting, feeding as well as independent mobility  Home safety : The patient has smoke detectors in the home. They wear seatbelts.  There are no firearms at home. There is no violence in the home.   There is no risks for hepatitis, STDs or HIV. There is no   history of blood transfusion. They have no travel history to infectious disease endemic areas of the world.  The patient has seen their dentist in the last six month. They have seen their eye doctor in the last year. They admit to slight hearing difficulty with regard to whispered voices and some television programs.  They have deferred audiologic testing in the last year.  They do not  have excessive sun exposure. Discussed the need for sun protection: hats, long sleeves and use of sunscreen if there is significant sun exposure.   Diet: the importance of a healthy diet is discussed. They do have a healthy diet.  The benefits of regular aerobic exercise were discussed. She walks 4 times per week ,  20 minutes.   Depression screen: there are no signs or vegative symptoms of depression- irritability, change in appetite, anhedonia, sadness/tearfullness.  Cognitive assessment: the patient manages all their financial and personal affairs and is actively engaged. They could relate  day,date,year and events; recalled 2/3 objects at 3 minutes; performed clock-face test normally.  The following portions of the patient's history were reviewed and updated as appropriate: allergies, current medications, past family history, past medical history,  past surgical history, past social history  and problem list.  Visual acuity was not assessed per patient preference since she has regular follow up with her ophthalmologist. Hearing and body mass index were assessed and reviewed.   During the course of the visit the patient was educated and counseled about appropriate screening and preventive services including : fall prevention , diabetes screening, nutrition counseling, colorectal cancer screening, and recommended immunizations.    Objective; BP 140/70  Pulse 60  Temp(Src) 98.7 F (37.1 C) (Oral)  Resp 16  Ht 5' (1.524 m)  Wt 156 lb 4 oz (70.875 kg)  BMI 30.52 kg/m2  SpO2 97%   General appearance: alert, cooperative and appears stated age Head: Normocephalic, without obvious abnormality, atraumatic Eyes: conjunctivae/corneas clear. PERRL, EOM's intact. Fundi benign. Ears: normal TM's and external ear canals both ears Nose: Nares normal. Septum midline. Mucosa normal. No drainage or sinus tenderness. Throat: lips, mucosa, and tongue normal; teeth and gums normal Neck: no adenopathy, no carotid bruit, no JVD, supple, symmetrical, trachea midline and thyroid not enlarged, symmetric, no tenderness/mass/nodules Lungs: clear to auscultation bilaterally Breasts: normal appearance, no masses or tenderness Heart: regular rate and rhythm, S1, S2 normal, no murmur, click, rub or gallop Abdomen: soft, non-tender; bowel sounds normal; no masses,  no  organomegaly Extremities: extremities normal, atraumatic, no cyanosis or edema Pulses: 2+ and symmetric Skin: Skin color, texture, turgor normal. No rashes or lesions Neurologic: Alert and oriented X 3, normal strength and tone. Normal  symmetric reflexes. Normal coordination and gait.   Assessment and Plan:  Other and unspecified hyperlipidemia New ACC guidelines recommend starting patients aged 38 or higher on moderate intensity statin therapy for LDL between 70-189 and 10 yr risk of CAD > 7.5% ;  and high intensity therapy for anyone with LDL > 190.  Her LDL is 163 and 10 yr risk is 14% .  Will advise trial of simvastatin.   Lab Results  Component Value Date   CHOL 234* 01/12/2014   HDL 47.50 01/12/2014   LDLCALC 161* 01/12/2014   LDLDIRECT 175.5 03/19/2013   TRIG 129.0 01/12/2014   CHOLHDL 5 01/12/2014    Routine general medical examination at a health care facility Annual comprehensive exam was done including breast, excluding pelvic and PAP smear. All screenings have been addressed and a printed health maintenance schedule was given to patient.    Bursitis of hip Improved with steroid I/A injection last fall,  Continue prn NSAIDs   Updated Medication List Outpatient Encounter Prescriptions as of 01/12/2014  Medication Sig  . escitalopram (LEXAPRO) 20 MG tablet Take 1 tablet (20 mg total) by mouth daily.  Marland Kitchen gabapentin (NEURONTIN) 300 MG capsule Take 1 capsule (300 mg total) by mouth 3 (three) times daily.  Marland Kitchen oxybutynin (DITROPAN-XL) 5 MG 24 hr tablet Take 1 tablet (5 mg total) by mouth daily.  . Tdap (BOOSTRIX) 5-2.5-18.5 LF-MCG/0.5 injection Inject 0.5 mLs into the muscle once.  . [DISCONTINUED] escitalopram (LEXAPRO) 20 MG tablet Take 1 tablet (20 mg total) by mouth daily.  . [DISCONTINUED] gabapentin (NEURONTIN) 300 MG capsule Take 1 capsule (300 mg total) by mouth 3 (three) times daily.  . [DISCONTINUED] oxybutynin (DITROPAN-XL) 5 MG 24 hr tablet Take 1 tablet (5 mg total) by mouth daily.

## 2014-01-12 NOTE — Patient Instructions (Signed)
You had your annual  wellness exam today.  We will repeat your PAP smear in 2016, sooner if needed   We will schedule your mammogram at [facility] in December .   You received the pneumona today.  If you develop a sore arm , use tylenol and ibuprofen for a few days   We will contact you with the bloodwork results Health Maintenance Adopting a healthy lifestyle and getting preventive care can go a long way to promote health and wellness. Talk with your health care provider about what schedule of regular examinations is right for you. This is a good chance for you to check in with your provider about disease prevention and staying healthy. In between checkups, there are plenty of things you can do on your own. Experts have done a lot of research about which lifestyle changes and preventive measures are most likely to keep you healthy. Ask your health care provider for more information. WEIGHT AND DIET  Eat a healthy diet  Be sure to include plenty of vegetables, fruits, low-fat dairy products, and lean protein.  Do not eat a lot of foods high in solid fats, added sugars, or salt.  Get regular exercise. This is one of the most important things you can do for your health.  Most adults should exercise for at least 150 minutes each week. The exercise should increase your heart rate and make you sweat (moderate-intensity exercise).  Most adults should also do strengthening exercises at least twice a week. This is in addition to the moderate-intensity exercise.  Maintain a healthy weight  Body mass index (BMI) is a measurement that can be used to identify possible weight problems. It estimates body fat based on height and weight. Your health care provider can help determine your BMI and help you achieve or maintain a healthy weight.  For females 50 years of age and older:   A BMI below 18.5 is considered underweight.  A BMI of 18.5 to 24.9 is normal.  A BMI of 25 to 29.9 is considered  overweight.  A BMI of 30 and above is considered obese.  Watch levels of cholesterol and blood lipids  You should start having your blood tested for lipids and cholesterol at 68 years of age, then have this test every 5 years.  You may need to have your cholesterol levels checked more often if:  Your lipid or cholesterol levels are high.  You are older than 68 years of age.  You are at high risk for heart disease.  CANCER SCREENING   Lung Cancer  Lung cancer screening is recommended for adults 8-58 years old who are at high risk for lung cancer because of a history of smoking.  A yearly low-dose CT scan of the lungs is recommended for people who:  Currently smoke.  Have quit within the past 15 years.  Have at least a 30-pack-year history of smoking. A pack year is smoking an average of one pack of cigarettes a day for 1 year.  Yearly screening should continue until it has been 15 years since you quit.  Yearly screening should stop if you develop a health problem that would prevent you from having lung cancer treatment.  Breast Cancer  Practice breast self-awareness. This means understanding how your breasts normally appear and feel.  It also means doing regular breast self-exams. Let your health care provider know about any changes, no matter how small.  If you are in your 20s or 30s, you should  have a clinical breast exam (CBE) by a health care provider every 1-3 years as part of a regular health exam.  If you are 38 or older, have a CBE every year. Also consider having a breast X-ray (mammogram) every year.  If you have a family history of breast cancer, talk to your health care provider about genetic screening.  If you are at high risk for breast cancer, talk to your health care provider about having an MRI and a mammogram every year.  Breast cancer gene (BRCA) assessment is recommended for women who have family members with BRCA-related cancers. BRCA-related  cancers include:  Breast.  Ovarian.  Tubal.  Peritoneal cancers.  Results of the assessment will determine the need for genetic counseling and BRCA1 and BRCA2 testing. Cervical Cancer Routine pelvic examinations to screen for cervical cancer are no longer recommended for nonpregnant women who are considered low risk for cancer of the pelvic organs (ovaries, uterus, and vagina) and who do not have symptoms. A pelvic examination may be necessary if you have symptoms including those associated with pelvic infections. Ask your health care provider if a screening pelvic exam is right for you.   The Pap test is the screening test for cervical cancer for women who are considered at risk.  If you had a hysterectomy for a problem that was not cancer or a condition that could lead to cancer, then you no longer need Pap tests.  If you are older than 65 years, and you have had normal Pap tests for the past 10 years, you no longer need to have Pap tests.  If you have had past treatment for cervical cancer or a condition that could lead to cancer, you need Pap tests and screening for cancer for at least 20 years after your treatment.  If you no longer get a Pap test, assess your risk factors if they change (such as having a new sexual partner). This can affect whether you should start being screened again.  Some women have medical problems that increase their chance of getting cervical cancer. If this is the case for you, your health care provider may recommend more frequent screening and Pap tests.  The human papillomavirus (HPV) test is another test that may be used for cervical cancer screening. The HPV test looks for the virus that can cause cell changes in the cervix. The cells collected during the Pap test can be tested for HPV.  The HPV test can be used to screen women 61 years of age and older. Getting tested for HPV can extend the interval between normal Pap tests from three to five  years.  An HPV test also should be used to screen women of any age who have unclear Pap test results.  After 68 years of age, women should have HPV testing as often as Pap tests.  Colorectal Cancer  This type of cancer can be detected and often prevented.  Routine colorectal cancer screening usually begins at 68 years of age and continues through 68 years of age.  Your health care provider may recommend screening at an earlier age if you have risk factors for colon cancer.  Your health care provider may also recommend using home test kits to check for hidden blood in the stool.  A small camera at the end of a tube can be used to examine your colon directly (sigmoidoscopy or colonoscopy). This is done to check for the earliest forms of colorectal cancer.  Routine screening usually  begins at age 82.  Direct examination of the colon should be repeated every 5-10 years through 68 years of age. However, you may need to be screened more often if early forms of precancerous polyps or small growths are found. Skin Cancer  Check your skin from head to toe regularly.  Tell your health care provider about any new moles or changes in moles, especially if there is a change in a mole's shape or color.  Also tell your health care provider if you have a mole that is larger than the size of a pencil eraser.  Always use sunscreen. Apply sunscreen liberally and repeatedly throughout the day.  Protect yourself by wearing long sleeves, pants, a wide-brimmed hat, and sunglasses whenever you are outside. HEART DISEASE, DIABETES, AND HIGH BLOOD PRESSURE   Have your blood pressure checked at least every 1-2 years. High blood pressure causes heart disease and increases the risk of stroke.  If you are between 57 years and 37 years old, ask your health care provider if you should take aspirin to prevent strokes.  Have regular diabetes screenings. This involves taking a blood sample to check your fasting  blood sugar level.  If you are at a normal weight and have a low risk for diabetes, have this test once every three years after 68 years of age.  If you are overweight and have a high risk for diabetes, consider being tested at a younger age or more often. PREVENTING INFECTION  Hepatitis B  If you have a higher risk for hepatitis B, you should be screened for this virus. You are considered at high risk for hepatitis B if:  You were born in a country where hepatitis B is common. Ask your health care provider which countries are considered high risk.  Your parents were born in a high-risk country, and you have not been immunized against hepatitis B (hepatitis B vaccine).  You have HIV or AIDS.  You use needles to inject street drugs.  You live with someone who has hepatitis B.  You have had sex with someone who has hepatitis B.  You get hemodialysis treatment.  You take certain medicines for conditions, including cancer, organ transplantation, and autoimmune conditions. Hepatitis C  Blood testing is recommended for:  Everyone born from [birth year] through 1965.  Anyone with known risk factors for hepatitis C. Sexually transmitted infections (STIs)  You should be screened for sexually transmitted infections (STIs) including gonorrhea and chlamydia if:  You are sexually active and are younger than 68 years of age.  You are older than 68 years of age and your health care provider tells you that you are at risk for this type of infection.  Your sexual activity has changed since you were last screened and you are at an increased risk for chlamydia or gonorrhea. Ask your health care provider if you are at risk.  If you do not have HIV, but are at risk, it may be recommended that you take a prescription medicine daily to prevent HIV infection. This is called pre-exposure prophylaxis (PrEP). You are considered at risk if:  You are sexually active and do not regularly use condoms or know  the HIV status of your partner(s).  You take drugs by injection.  You are sexually active with a partner who has HIV. Talk with your health care provider about whether you are at high risk of being infected with HIV. If you choose to begin PrEP, you should first be tested for  HIV. You should then be tested every 3 months for as long as you are taking PrEP.  PREGNANCY   If you are premenopausal and you may become pregnant, ask your health care provider about preconception counseling.  If you may become pregnant, take 400 to 800 micrograms (mcg) of folic acid every day.  If you want to prevent pregnancy, talk to your health care provider about birth control (contraception). OSTEOPOROSIS AND MENOPAUSE   Osteoporosis is a disease in which the bones lose minerals and strength with aging. This can result in serious bone fractures. Your risk for osteoporosis can be identified using a bone density scan.  If you are 89 years of age or older, or if you are at risk for osteoporosis and fractures, ask your health care provider if you should be screened.  Ask your health care provider whether you should take a calcium or vitamin D supplement to lower your risk for osteoporosis.  Menopause may have certain physical symptoms and risks.  Hormone replacement therapy may reduce some of these symptoms and risks. Talk to your health care provider about whether hormone replacement therapy is right for you.  HOME CARE INSTRUCTIONS   Schedule regular health, dental, and eye exams.  Stay current with your immunizations.   Do not use any tobacco products including cigarettes, chewing tobacco, or electronic cigarettes.  If you are pregnant, do not drink alcohol.  If you are breastfeeding, limit how much and how often you drink alcohol.  Limit alcohol intake to no more than 1 drink per day for nonpregnant women. One drink equals 12 ounces of beer, 5 ounces of wine, or 1 ounces of hard liquor.  Do not  use street drugs.  Do not share needles.  Ask your health care provider for help if you need support or information about quitting drugs.  Tell your health care provider if you often feel depressed.  Tell your health care provider if you have ever been abused or do not feel safe at home. Document Released: 12/19/2010 Document Revised: 10/20/2013 Document Reviewed: 05/07/2013 [facility] Patient Information 2015 [place], [place]. This information is not intended to replace advice given to you by your health care provider. Make sure you discuss any questions you have with your health care provider.

## 2014-01-12 NOTE — Progress Notes (Signed)
Pre-visit discussion using our clinic review tool. No additional management support is needed unless otherwise documented below in the visit note.  

## 2014-01-13 ENCOUNTER — Other Ambulatory Visit: Payer: Self-pay | Admitting: Internal Medicine

## 2014-01-13 ENCOUNTER — Encounter: Payer: Self-pay | Admitting: Internal Medicine

## 2014-01-13 DIAGNOSIS — E785 Hyperlipidemia, unspecified: Secondary | ICD-10-CM | POA: Insufficient documentation

## 2014-01-13 DIAGNOSIS — M7070 Other bursitis of hip, unspecified hip: Secondary | ICD-10-CM | POA: Insufficient documentation

## 2014-01-13 DIAGNOSIS — E559 Vitamin D deficiency, unspecified: Secondary | ICD-10-CM | POA: Insufficient documentation

## 2014-01-13 MED ORDER — ERGOCALCIFEROL 1.25 MG (50000 UT) PO CAPS: 50000.0000 [IU] | ORAL_CAPSULE | ORAL | Status: DC

## 2014-01-13 NOTE — Assessment & Plan Note (Signed)
New ACC guidelines recommend starting patients aged 68 or higher on moderate intensity statin therapy for LDL between 70-189 and 10 yr risk of CAD > 7.5% ;  and high intensity therapy for anyone with LDL > 190.  Her LDL is 163 and 10 yr risk is 14% .  Will advise trial of simvastatin.   Lab Results  Component Value Date   CHOL 234* 01/12/2014   HDL 47.50 01/12/2014   LDLCALC 161* 01/12/2014   LDLDIRECT 175.5 03/19/2013   TRIG 129.0 01/12/2014   CHOLHDL 5 01/12/2014

## 2014-01-13 NOTE — Assessment & Plan Note (Signed)
Improved with steroid I/A injection last fall,  Continue prn NSAIDs

## 2014-01-13 NOTE — Assessment & Plan Note (Addendum)
Annual comprehensive exam was done including breast, excluding pelvic and PAP smear. All screenings have been addressed and a printed health maintenance schedule was given to patient.   

## 2014-01-28 MED ORDER — SIMVASTATIN 20 MG PO TABS: 20.0000 mg | ORAL_TABLET | Freq: Every day | ORAL | Status: DC

## 2014-03-11 ENCOUNTER — Other Ambulatory Visit: Payer: Self-pay | Admitting: Internal Medicine

## 2014-05-12 ENCOUNTER — Other Ambulatory Visit: Payer: Self-pay | Admitting: Internal Medicine

## 2014-08-05 ENCOUNTER — Other Ambulatory Visit: Payer: Self-pay | Admitting: Internal Medicine

## 2014-08-19 NOTE — Telephone Encounter (Signed)
Mailed unread message to pt  

## 2014-09-22 ENCOUNTER — Telehealth: Payer: Self-pay

## 2014-09-22 DIAGNOSIS — Z1159 Encounter for screening for other viral diseases: Secondary | ICD-10-CM

## 2014-09-22 DIAGNOSIS — E785 Hyperlipidemia, unspecified: Secondary | ICD-10-CM

## 2014-09-22 DIAGNOSIS — R5383 Other fatigue: Secondary | ICD-10-CM

## 2014-09-22 DIAGNOSIS — E559 Vitamin D deficiency, unspecified: Secondary | ICD-10-CM

## 2014-09-22 NOTE — Telephone Encounter (Signed)
Labs ordered.

## 2014-09-22 NOTE — Telephone Encounter (Signed)
The patient called stating she wanted to schedule her wellness exam.  However, she also wanted her blood work done, specifically to check her cholesterol.  Does she need to be added to Tonika's schedule and to the lab schedule?  Or would this type of appointment be considered an annual physical with Dr.Tullo?

## 2014-09-22 NOTE — Telephone Encounter (Signed)
Patient can still have wellness visit done with me. Dr. Derrel Nip would just have to order whatever labs she wants before patient comes in for appointment or tell me which labs she want approves to have checked.

## 2014-10-05 ENCOUNTER — Other Ambulatory Visit: Payer: Self-pay | Admitting: Internal Medicine

## 2014-10-19 NOTE — Telephone Encounter (Signed)
Mailed unread message to pt  

## 2014-12-07 ENCOUNTER — Telehealth: Payer: Self-pay

## 2014-12-07 DIAGNOSIS — Z1239 Encounter for other screening for malignant neoplasm of breast: Secondary | ICD-10-CM

## 2014-12-07 NOTE — Telephone Encounter (Signed)
ORDERED,  THANKS

## 2014-12-07 NOTE — Telephone Encounter (Signed)
Pt is scheduled for her mammogram on Monday 9.26.16 @ 1:00 @ [facility]

## 2014-12-07 NOTE — Telephone Encounter (Signed)
Pt would like to schedule her mammogram at [facility] in September, the order has expired. Could you put in a new order? Thank you

## 2015-02-08 ENCOUNTER — Encounter: Payer: Commercial Managed Care - HMO | Admitting: Internal Medicine

## 2015-03-05 ENCOUNTER — Encounter: Payer: Self-pay | Admitting: Internal Medicine

## 2015-03-05 ENCOUNTER — Ambulatory Visit (INDEPENDENT_AMBULATORY_CARE_PROVIDER_SITE_OTHER): Payer: Commercial Managed Care - HMO | Admitting: Internal Medicine

## 2015-03-05 VITALS — BP 138/72 | HR 67 | Temp 98.3°F | Resp 12 | Ht 60.5 in | Wt 141.0 lb

## 2015-03-05 DIAGNOSIS — Z23 Encounter for immunization: Secondary | ICD-10-CM | POA: Diagnosis not present

## 2015-03-05 DIAGNOSIS — Z0000 Encounter for general adult medical examination without abnormal findings: Secondary | ICD-10-CM | POA: Diagnosis not present

## 2015-03-05 DIAGNOSIS — Z113 Encounter for screening for infections with a predominantly sexual mode of transmission: Secondary | ICD-10-CM

## 2015-03-05 DIAGNOSIS — R030 Elevated blood-pressure reading, without diagnosis of hypertension: Secondary | ICD-10-CM

## 2015-03-05 DIAGNOSIS — R5383 Other fatigue: Secondary | ICD-10-CM

## 2015-03-05 DIAGNOSIS — E785 Hyperlipidemia, unspecified: Secondary | ICD-10-CM | POA: Diagnosis not present

## 2015-03-05 MED ORDER — TETANUS-DIPHTH-ACELL PERTUSSIS 5-2.5-18.5 LF-MCG/0.5 IM SUSP
0.5000 mL | Freq: Once | INTRAMUSCULAR | Status: DC
Start: 2015-03-05 — End: 2018-11-26

## 2015-03-05 NOTE — Progress Notes (Signed)
Patient ID: Melissa Bonilla, female    DOB: 14-Feb-1946  Age: 69 y.o. MRN: [id number]  The patient is here for annual Medicare wellness examination and management of other chronic and acute problems.   PAP smear normal 2013 at age 31  DEXA remotely osteopenia left hip 'mammogram next week  colonoscopy normal 2013 no FH   The risk factors are reflected in the social history.  The roster of all physicians providing medical care to patient - is listed in the Snapshot section of the chart.  Activities of daily living:  The patient is 100% independent in all ADLs: dressing, toileting, feeding as well as independent mobility  Home safety : The patient has smoke detectors in the home. They wear seatbelts.  There are no firearms at home. There is no violence in the home.   There is no risks for hepatitis, STDs or HIV. There is no   history of blood transfusion. They have no travel history to infectious disease endemic areas of the world.  The patient has seen their dentist in the last six month. They have seen their eye doctor in the last year. They admit to slight hearing difficulty with regard to whispered voices and some television programs.  They have deferred audiologic testing in the last year.  They do not  have excessive sun exposure. Discussed the need for sun protection: hats, long sleeves and use of sunscreen if there is significant sun exposure.   Diet: the importance of a healthy diet is discussed. They do have a healthy diet.  The benefits of regular aerobic exercise were discussed. She walks 4 times per week ,  20 minutes.   Depression screen: there are no signs or vegative symptoms of depression- irritability, change in appetite, anhedonia, sadness/tearfullness.  Cognitive assessment: the patient manages all their financial and personal affairs and is actively engaged. They could relate day,date,year and events; recalled 2/3 objects at 3 minutes; performed clock-face test  normally.  The following portions of the patient's history were reviewed and updated as appropriate: allergies, current medications, past family history, past medical history,  past surgical history, past social history  and problem list.  Visual acuity was not assessed per patient preference since she has regular follow up with her ophthalmologist. Hearing and body mass index were assessed and reviewed.   During the course of the visit the patient was educated and counseled about appropriate screening and preventive services including : fall prevention , diabetes screening, nutrition counseling, colorectal cancer screening, and recommended immunizations.    CC: The primary encounter diagnosis was Encounter for immunization. Diagnoses of Other fatigue, Hyperlipidemia, Screen for STD (sexually transmitted disease), Prehypertension, Need for prophylactic vaccination against Streptococcus pneumoniae (pneumococcus), Hyperlipidemia with target LDL less than 100, and Routine general medical examination at a health care facility were also pertinent to this visit.  History Melba has a past medical history of Menopausal syndrome (hot flashes) (1992).   She has past surgical history that includes Cesarean section and Appendectomy (1972).   Her family history includes Arthritis in her maternal grandmother and mother; Heart disease in her mother; Hypertension in her maternal grandmother and mother; Stroke in her maternal grandmother.She reports that she quit smoking about 23 years ago. She has never used smokeless tobacco. She reports that she drinks alcohol. She reports that she does not use illicit drugs.  Outpatient Prescriptions Prior to Visit  Medication Sig Dispense Refill  . ergocalciferol (DRISDOL) 50000 UNITS capsule Take 1 capsule (50,000 Units  total) by mouth once a week. 4 capsule 0  . escitalopram (LEXAPRO) 20 MG tablet TAKE 1 TABLET EVERY DAY 90 tablet 0  . gabapentin (NEURONTIN) 300 MG  capsule Take 1 capsule (300 mg total) by mouth 3 (three) times daily. 270 capsule 3  . oxybutynin (DITROPAN-XL) 5 MG 24 hr tablet Take 1 tablet (5 mg total) by mouth daily. 90 tablet 3  . simvastatin (ZOCOR) 20 MG tablet Take 1 tablet (20 mg total) by mouth at bedtime. (Patient not taking: Reported on 03/05/2015) 90 tablet 3  . Tdap (BOOSTRIX) 5-2.5-18.5 LF-MCG/0.5 injection Inject 0.5 mLs into the muscle once. 0.5 mL 0   No facility-administered medications prior to visit.    Review of Systems   Patient denies headache, fevers, malaise, unintentional weight loss, skin rash, eye pain, sinus congestion and sinus pain, sore throat, dysphagia,  hemoptysis , cough, dyspnea, wheezing, chest pain, palpitations, orthopnea, edema, abdominal pain, nausea, melena, diarrhea, constipation, flank pain, dysuria, hematuria, urinary  Frequency, nocturia, numbness, tingling, seizures,  Focal weakness, Loss of consciousness,  Tremor, insomnia, depression, anxiety, and suicidal ideation.     Objective:  BP 138/72 mmHg  Pulse 67  Temp(Src) 98.3 F (36.8 C) (Oral)  Resp 12  Ht 5' 0.5" (1.537 m)  Wt 141 lb (63.957 kg)  BMI 27.07 kg/m2  SpO2 97%  Physical Exam  General appearance: alert, cooperative and appears stated age Head: Normocephalic, without obvious abnormality, atraumatic Eyes: conjunctivae/corneas clear. PERRL, EOM's intact. Fundi benign. Ears: normal TM's and external ear canals both ears Nose: Nares normal. Septum midline. Mucosa normal. No drainage or sinus tenderness. Throat: lips, mucosa, and tongue normal; teeth and gums normal Neck: no adenopathy, no carotid bruit, no JVD, supple, symmetrical, trachea midline and thyroid not enlarged, symmetric, no tenderness/mass/nodules Lungs: clear to auscultation bilaterally Breasts: normal appearance, no masses or tenderness Heart: regular rate and rhythm, S1, S2 normal, no murmur, click, rub or gallop Abdomen: soft, non-tender; bowel sounds  normal; no masses,  no organomegaly Extremities: extremities normal, atraumatic, no cyanosis or edema Pulses: 2+ and symmetric Skin: Skin color, texture, turgor normal. No rashes or lesions Neurologic: Alert and oriented X 3, normal strength and tone. Normal symmetric reflexes. Normal coordination and gait.    Assessment & Plan:   Problem List Items Addressed This Visit      Unprioritized   Routine general medical examination at a health care facility    Annual Medicare wellness  exam was done as well as a comprehensive physical exam and management of acute and chronic conditions .  During the course of the visit the patient was educated and counseled about appropriate screening and preventive services including : fall prevention , diabetes screening, nutrition counseling, colorectal cancer screening, and recommended immunizations.  Printed recommendations for health maintenance screenings was given.       Hyperlipidemia with target LDL less than 100    New ACC guidelines recommend starting patients aged 73 or higher on moderate intensity statin therapy for LDL between 70-189 and 10 yr risk of CAD > 7.5% ;  and high intensity therapy for anyone with LDL > 190.  Her last  LDL is 175 and 10 yr risk is 14% .  She will consider a trial of simvastatin if repeat lipids are still above goal. .   Lab Results  Component Value Date   CHOL 234* 01/12/2014   HDL 47.50 01/12/2014   LDLCALC 161* 01/12/2014   LDLDIRECT 175.5 03/19/2013   TRIG 129.0 01/12/2014  CHOLHDL 5 01/12/2014           Other Visit Diagnoses    Encounter for immunization    -  Primary    Other fatigue        Hyperlipidemia        Screen for STD (sexually transmitted disease)        Relevant Orders    HIV antibody    Prehypertension        Need for prophylactic vaccination against Streptococcus pneumoniae (pneumococcus)        Relevant Orders    Pneumococcal polysaccharide vaccine 23-valent greater than or equal to  2yo subcutaneous/IM (Completed)       I have discontinued Ms. Roberge's simvastatin. I am also having her maintain her gabapentin, oxybutynin, ergocalciferol, escitalopram, and Tdap.  Meds ordered this encounter  Medications  . Tdap (BOOSTRIX) 5-2.5-18.5 LF-MCG/0.5 injection    Sig: Inject 0.5 mLs into the muscle once.    Dispense:  0.5 mL    Refill:  0    Medications Discontinued During This Encounter  Medication Reason  . Tdap (BOOSTRIX) 5-2.5-18.5 LF-MCG/0.5 injection Reorder  . simvastatin (ZOCOR) 20 MG tablet     Follow-up: No Follow-up on file.   Crecencio Mc, MD

## 2015-03-05 NOTE — Progress Notes (Signed)
Pre-visit discussion using our clinic review tool. No additional management support is needed unless otherwise documented below in the visit note.  

## 2015-03-05 NOTE — Patient Instructions (Signed)
You received the Pneumovax vaccine today and are due fo the TDaP vaccine which is less $$$ at [facility]  Please return for fasting labs   Bone density test will be ordered  Health Maintenance Adopting a healthy lifestyle and getting preventive care can go a long way to promote health and wellness. Talk with your health care provider about what schedule of regular examinations is right for you. This is a good chance for you to check in with your provider about disease prevention and staying healthy. In between checkups, there are plenty of things you can do on your own. Experts have done a lot of research about which lifestyle changes and preventive measures are most likely to keep you healthy. Ask your health care provider for more information. WEIGHT AND DIET  Eat a healthy diet  Be sure to include plenty of vegetables, fruits, low-fat dairy products, and lean protein.  Do not eat a lot of foods high in solid fats, added sugars, or salt.  Get regular exercise. This is one of the most important things you can do for your health.  Most adults should exercise for at least 150 minutes each week. The exercise should increase your heart rate and make you sweat (moderate-intensity exercise).  Most adults should also do strengthening exercises at least twice a week. This is in addition to the moderate-intensity exercise.  Maintain a healthy weight  Body mass index (BMI) is a measurement that can be used to identify possible weight problems. It estimates body fat based on height and weight. Your health care provider can help determine your BMI and help you achieve or maintain a healthy weight.  For females 69 years of age and older:   A BMI below 18.5 is considered underweight.  A BMI of 18.5 to 24.9 is normal.  A BMI of 25 to 29.9 is considered overweight.  A BMI of 30 and above is considered obese.  Watch levels of cholesterol and blood lipids  You should start having your blood  tested for lipids and cholesterol at 69 years of age, then have this test every 5 years.  You may need to have your cholesterol levels checked more often if:  Your lipid or cholesterol levels are high.  You are older than 69 years of age.  You are at high risk for heart disease.  CANCER SCREENING   Lung Cancer  Lung cancer screening is recommended for adults 69-40 years old who are at high risk for lung cancer because of a history of smoking.  A yearly low-dose CT scan of the lungs is recommended for people who:  Currently smoke.  Have quit within the past 15 years.  Have at least a 30-pack-year history of smoking. A pack year is smoking an average of one pack of cigarettes a day for 1 year.  Yearly screening should continue until it has been 15 years since you quit.  Yearly screening should stop if you develop a health problem that would prevent you from having lung cancer treatment.  Breast Cancer  Practice breast self-awareness. This means understanding how your breasts normally appear and feel.  It also means doing regular breast self-exams. Let your health care provider know about any changes, no matter how small.  If you are in your 69s or 30s, you should have a clinical breast exam (CBE) by a health care provider every 1-3 years as part of a regular health exam.  If you are 69 or older, have a  CBE every year. Also consider having a breast X-ray (mammogram) every year.  If you have a family history of breast cancer, talk to your health care provider about genetic screening.  If you are at high risk for breast cancer, talk to your health care provider about having an MRI and a mammogram every year.  Breast cancer gene (BRCA) assessment is recommended for women who have family members with BRCA-related cancers. BRCA-related cancers include:  Breast.  Ovarian.  Tubal.  Peritoneal cancers.  Results of the assessment will determine the need for genetic counseling  and BRCA1 and BRCA2 testing. Cervical Cancer Routine pelvic examinations to screen for cervical cancer are no longer recommended for nonpregnant women who are considered low risk for cancer of the pelvic organs (ovaries, uterus, and vagina) and who do not have symptoms. A pelvic examination may be necessary if you have symptoms including those associated with pelvic infections. Ask your health care provider if a screening pelvic exam is right for you.   The Pap test is the screening test for cervical cancer for women who are considered at risk.  If you had a hysterectomy for a problem that was not cancer or a condition that could lead to cancer, then you no longer need Pap tests.  If you are older than 65 years, and you have had normal Pap tests for the past 10 years, you no longer need to have Pap tests.  If you have had past treatment for cervical cancer or a condition that could lead to cancer, you need Pap tests and screening for cancer for at least 20 years after your treatment.  If you no longer get a Pap test, assess your risk factors if they change (such as having a new sexual partner). This can affect whether you should start being screened again.  Some women have medical problems that increase their chance of getting cervical cancer. If this is the case for you, your health care provider may recommend more frequent screening and Pap tests.  The human papillomavirus (HPV) test is another test that may be used for cervical cancer screening. The HPV test looks for the virus that can cause cell changes in the cervix. The cells collected during the Pap test can be tested for HPV.  The HPV test can be used to screen women 69 years of age and older. Getting tested for HPV can extend the interval between normal Pap tests from three to five years.  An HPV test also should be used to screen women of any age who have unclear Pap test results.  After 69 years of age, women should have HPV testing  as often as Pap tests.  Colorectal Cancer  This type of cancer can be detected and often prevented.  Routine colorectal cancer screening usually begins at 69 years of age and continues through 69 years of age.  Your health care provider may recommend screening at an earlier age if you have risk factors for colon cancer.  Your health care provider may also recommend using home test kits to check for hidden blood in the stool.  A small camera at the end of a tube can be used to examine your colon directly (sigmoidoscopy or colonoscopy). This is done to check for the earliest forms of colorectal cancer.  Routine screening usually begins at age 43.  Direct examination of the colon should be repeated every 5-10 years through 69 years of age. However, you may need to be screened more often  if early forms of precancerous polyps or small growths are found. Skin Cancer  Check your skin from head to toe regularly.  Tell your health care provider about any new moles or changes in moles, especially if there is a change in a mole's shape or color.  Also tell your health care provider if you have a mole that is larger than the size of a pencil eraser.  Always use sunscreen. Apply sunscreen liberally and repeatedly throughout the day.  Protect yourself by wearing long sleeves, pants, a wide-brimmed hat, and sunglasses whenever you are outside. HEART DISEASE, DIABETES, AND HIGH BLOOD PRESSURE   Have your blood pressure checked at least every 1-2 years. High blood pressure causes heart disease and increases the risk of stroke.  If you are between 35 years and 54 years old, ask your health care provider if you should take aspirin to prevent strokes.  Have regular diabetes screenings. This involves taking a blood sample to check your fasting blood sugar level.  If you are at a normal weight and have a low risk for diabetes, have this test once every three years after 69 years of age.  If you are  overweight and have a high risk for diabetes, consider being tested at a younger age or more often. PREVENTING INFECTION  Hepatitis B  If you have a higher risk for hepatitis B, you should be screened for this virus. You are considered at high risk for hepatitis B if:  You were born in a country where hepatitis B is common. Ask your health care provider which countries are considered high risk.  Your parents were born in a high-risk country, and you have not been immunized against hepatitis B (hepatitis B vaccine).  You have HIV or AIDS.  You use needles to inject street drugs.  You live with someone who has hepatitis B.  You have had sex with someone who has hepatitis B.  You get hemodialysis treatment.  You take certain medicines for conditions, including cancer, organ transplantation, and autoimmune conditions. Hepatitis C  Blood testing is recommended for:  Everyone born from [birth year] through 1965.  Anyone with known risk factors for hepatitis C. Sexually transmitted infections (STIs)  You should be screened for sexually transmitted infections (STIs) including gonorrhea and chlamydia if:  You are sexually active and are younger than 69 years of age.  You are older than 70 years of age and your health care provider tells you that you are at risk for this type of infection.  Your sexual activity has changed since you were last screened and you are at an increased risk for chlamydia or gonorrhea. Ask your health care provider if you are at risk.  If you do not have HIV, but are at risk, it may be recommended that you take a prescription medicine daily to prevent HIV infection. This is called pre-exposure prophylaxis (PrEP). You are considered at risk if:  You are sexually active and do not regularly use condoms or know the HIV status of your partner(s).  You take drugs by injection.  You are sexually active with a partner who has HIV. Talk with your health care provider  about whether you are at high risk of being infected with HIV. If you choose to begin PrEP, you should first be tested for HIV. You should then be tested every 3 months for as long as you are taking PrEP.  PREGNANCY   If you are premenopausal and you may become pregnant,  ask your health care provider about preconception counseling.  If you may become pregnant, take 400 to 800 micrograms (mcg) of folic acid every day.  If you want to prevent pregnancy, talk to your health care provider about birth control (contraception). OSTEOPOROSIS AND MENOPAUSE   Osteoporosis is a disease in which the bones lose minerals and strength with aging. This can result in serious bone fractures. Your risk for osteoporosis can be identified using a bone density scan.  If you are 45 years of age or older, or if you are at risk for osteoporosis and fractures, ask your health care provider if you should be screened.  Ask your health care provider whether you should take a calcium or vitamin D supplement to lower your risk for osteoporosis.  Menopause may have certain physical symptoms and risks.  Hormone replacement therapy may reduce some of these symptoms and risks. Talk to your health care provider about whether hormone replacement therapy is right for you.  HOME CARE INSTRUCTIONS   Schedule regular health, dental, and eye exams.  Stay current with your immunizations.   Do not use any tobacco products including cigarettes, chewing tobacco, or electronic cigarettes.  If you are pregnant, do not drink alcohol.  If you are breastfeeding, limit how much and how often you drink alcohol.  Limit alcohol intake to no more than 1 drink per day for nonpregnant women. One drink equals 12 ounces of beer, 5 ounces of wine, or 1 ounces of hard liquor.  Do not use street drugs.  Do not share needles.  Ask your health care provider for help if you need support or information about quitting drugs.  Tell your  health care provider if you often feel depressed.  Tell your health care provider if you have ever been abused or do not feel safe at home. Document Released: 12/19/2010 Document Revised: 10/20/2013 Document Reviewed: 05/07/2013 [facility] Patient Information 2015 [place], [place]. This information is not intended to replace advice given to you by your health care provider. Make sure you discuss any questions you have with your health care provider.

## 2015-03-07 NOTE — Assessment & Plan Note (Addendum)
New ACC guidelines recommend starting patients aged 69 or higher on moderate intensity statin therapy for LDL between 70-189 and 10 yr risk of CAD > 7.5% ;  and high intensity therapy for anyone with LDL > 190.  Her last  LDL is 175 and 10 yr risk is 14% .  She will consider a trial of simvastatin if repeat lipids are still above goal. .   Lab Results  Component Value Date   CHOL 234* 01/12/2014   HDL 47.50 01/12/2014   LDLCALC 161* 01/12/2014   LDLDIRECT 175.5 03/19/2013   TRIG 129.0 01/12/2014   CHOLHDL 5 01/12/2014

## 2015-03-07 NOTE — Assessment & Plan Note (Signed)

## 2015-03-07 NOTE — Addendum Note (Signed)
Addended by: Crecencio Mc on: 03/07/2015 05:39 PM   Modules accepted: Miquel Dunn

## 2015-03-08 ENCOUNTER — Other Ambulatory Visit: Payer: Self-pay | Admitting: Internal Medicine

## 2015-03-08 ENCOUNTER — Ambulatory Visit
Admission: RE | Admit: 2015-03-08 | Discharge: 2015-03-08 | Disposition: A | Discharge status: Home / Self Care | Payer: Commercial Managed Care - HMO | Source: Ambulatory Visit | Attending: Internal Medicine | Admitting: Internal Medicine

## 2015-03-08 DIAGNOSIS — Z1231 Encounter for screening mammogram for malignant neoplasm of breast: Secondary | ICD-10-CM | POA: Insufficient documentation

## 2015-03-08 DIAGNOSIS — Z1239 Encounter for other screening for malignant neoplasm of breast: Secondary | ICD-10-CM

## 2015-03-10 ENCOUNTER — Other Ambulatory Visit: Payer: Self-pay | Admitting: Internal Medicine

## 2015-03-10 DIAGNOSIS — R928 Other abnormal and inconclusive findings on diagnostic imaging of breast: Secondary | ICD-10-CM

## 2015-03-15 ENCOUNTER — Ambulatory Visit
Admission: RE | Admit: 2015-03-15 | Discharge: 2015-03-15 | Disposition: A | Discharge status: Home / Self Care | Payer: Commercial Managed Care - HMO | Source: Ambulatory Visit | Attending: Internal Medicine | Admitting: Internal Medicine

## 2015-03-15 DIAGNOSIS — Z1239 Encounter for other screening for malignant neoplasm of breast: Secondary | ICD-10-CM

## 2015-03-22 ENCOUNTER — Other Ambulatory Visit (INDEPENDENT_AMBULATORY_CARE_PROVIDER_SITE_OTHER): Payer: Commercial Managed Care - HMO

## 2015-03-22 DIAGNOSIS — E559 Vitamin D deficiency, unspecified: Secondary | ICD-10-CM

## 2015-03-22 DIAGNOSIS — E785 Hyperlipidemia, unspecified: Secondary | ICD-10-CM

## 2015-03-22 DIAGNOSIS — Z113 Encounter for screening for infections with a predominantly sexual mode of transmission: Secondary | ICD-10-CM | POA: Diagnosis not present

## 2015-03-22 DIAGNOSIS — R5383 Other fatigue: Secondary | ICD-10-CM | POA: Diagnosis not present

## 2015-03-22 DIAGNOSIS — Z1159 Encounter for screening for other viral diseases: Secondary | ICD-10-CM

## 2015-03-22 LAB — COMPREHENSIVE METABOLIC PANEL
ALT: 17 U/L (ref 0–35)
AST: 17 U/L (ref 0–37)
Albumin: 4 g/dL (ref 3.5–5.2)
Alkaline Phosphatase: 94 U/L (ref 39–117)
BUN: 15 mg/dL (ref 6–23)
CO2: 29 mEq/L (ref 19–32)
Calcium: 8.9 mg/dL (ref 8.4–10.5)
Chloride: 104 mEq/L (ref 96–112)
Creatinine, Ser: 0.72 mg/dL (ref 0.40–1.20)
GFR: 85.36 mL/min (ref >60.00)
Glucose, Bld: 96 mg/dL (ref 70–99)
Potassium: 4.9 mEq/L (ref 3.5–5.1)
Sodium: 140 mEq/L (ref 135–145)
Total Bilirubin: 0.5 mg/dL (ref 0.2–1.2)
Total Protein: 6.4 g/dL (ref 6.0–8.3)

## 2015-03-22 LAB — LIPID PANEL
Cholesterol: 241 mg/dL — ABNORMAL HIGH (ref 0–200)
HDL: 45.5 mg/dL (ref >39.00)
LDL Cholesterol: 171 mg/dL — ABNORMAL HIGH (ref 0–99)
NonHDL: 195.06
Total CHOL/HDL Ratio: 5
Triglycerides: 119 mg/dL (ref 0.0–149.0)
VLDL: 23.8 mg/dL (ref 0.0–40.0)

## 2015-03-22 LAB — TSH: TSH: 1.35 u[IU]/mL (ref 0.35–4.50)

## 2015-03-22 LAB — VITAMIN D 25 HYDROXY (VIT D DEFICIENCY, FRACTURES): VITD: 26.67 ng/mL — ABNORMAL LOW (ref 30.00–100.00)

## 2015-03-23 LAB — HIV ANTIBODY (ROUTINE TESTING W REFLEX): HIV 1&2 Ab, 4th Generation: NONREACTIVE

## 2015-03-23 LAB — HEPATITIS C ANTIBODY: HCV Ab: NEGATIVE

## 2015-03-24 ENCOUNTER — Ambulatory Visit
Admission: RE | Admit: 2015-03-24 | Discharge: 2015-03-24 | Disposition: A | Discharge status: Home / Self Care | Payer: Commercial Managed Care - HMO | Source: Ambulatory Visit | Attending: Internal Medicine | Admitting: Internal Medicine

## 2015-03-24 DIAGNOSIS — N63 Unspecified lump in breast: Secondary | ICD-10-CM | POA: Insufficient documentation

## 2015-03-24 DIAGNOSIS — R922 Inconclusive mammogram: Secondary | ICD-10-CM | POA: Diagnosis not present

## 2015-03-24 DIAGNOSIS — R928 Other abnormal and inconclusive findings on diagnostic imaging of breast: Secondary | ICD-10-CM

## 2015-03-25 ENCOUNTER — Encounter: Payer: Self-pay | Admitting: Internal Medicine

## 2015-04-12 NOTE — Telephone Encounter (Signed)
Mailed unread message to patient.  

## 2015-06-29 ENCOUNTER — Other Ambulatory Visit: Payer: Self-pay | Admitting: Internal Medicine

## 2015-06-30 ENCOUNTER — Other Ambulatory Visit: Payer: Self-pay | Admitting: Internal Medicine

## 2015-06-30 NOTE — Telephone Encounter (Signed)
Please advise? Patient was seen in September. Does she need to make a appointment before refill or am i able to refill?

## 2015-07-01 MED ORDER — ESCITALOPRAM OXALATE 20 MG PO TABS: 20.0000 mg | ORAL_TABLET | Freq: Every day | ORAL | Status: DC

## 2015-07-01 NOTE — Telephone Encounter (Signed)
No.  She's not overdue yet for  6 months Filled for 90 days

## 2015-09-02 ENCOUNTER — Encounter: Payer: Self-pay | Admitting: Internal Medicine

## 2015-09-02 ENCOUNTER — Ambulatory Visit (INDEPENDENT_AMBULATORY_CARE_PROVIDER_SITE_OTHER): Payer: Commercial Managed Care - HMO | Admitting: Internal Medicine

## 2015-09-02 VITALS — BP 126/74 | HR 61 | Temp 98.1°F | Resp 12 | Ht 61.0 in | Wt 161.0 lb

## 2015-09-02 DIAGNOSIS — E559 Vitamin D deficiency, unspecified: Secondary | ICD-10-CM | POA: Diagnosis not present

## 2015-09-02 DIAGNOSIS — E669 Obesity, unspecified: Secondary | ICD-10-CM | POA: Diagnosis not present

## 2015-09-02 DIAGNOSIS — E785 Hyperlipidemia, unspecified: Secondary | ICD-10-CM

## 2015-09-02 DIAGNOSIS — N63 Unspecified lump in unspecified breast: Secondary | ICD-10-CM

## 2015-09-02 DIAGNOSIS — R928 Other abnormal and inconclusive findings on diagnostic imaging of breast: Secondary | ICD-10-CM

## 2015-09-02 LAB — COMPREHENSIVE METABOLIC PANEL
ALT: 22 U/L (ref 0–35)
AST: 19 U/L (ref 0–37)
Albumin: 4.3 g/dL (ref 3.5–5.2)
Alkaline Phosphatase: 100 U/L (ref 39–117)
BUN: 15 mg/dL (ref 6–23)
CO2: 31 mEq/L (ref 19–32)
Calcium: 9.5 mg/dL (ref 8.4–10.5)
Chloride: 101 mEq/L (ref 96–112)
Creatinine, Ser: 0.74 mg/dL (ref 0.40–1.20)
GFR: 82.59 mL/min (ref >60.00)
Glucose, Bld: 99 mg/dL (ref 70–99)
Potassium: 4.8 mEq/L (ref 3.5–5.1)
Sodium: 137 mEq/L (ref 135–145)
Total Bilirubin: 0.5 mg/dL (ref 0.2–1.2)
Total Protein: 7.2 g/dL (ref 6.0–8.3)

## 2015-09-02 LAB — LIPID PANEL
Cholesterol: 234 mg/dL — ABNORMAL HIGH (ref 0–200)
HDL: 45 mg/dL (ref >39.00)
LDL Cholesterol: 164 mg/dL — ABNORMAL HIGH (ref 0–99)
NonHDL: 188.72
Total CHOL/HDL Ratio: 5
Triglycerides: 123 mg/dL (ref 0.0–149.0)
VLDL: 24.6 mg/dL (ref 0.0–40.0)

## 2015-09-02 LAB — VITAMIN D 25 HYDROXY (VIT D DEFICIENCY, FRACTURES): VITD: 23.31 ng/mL — ABNORMAL LOW (ref 30.00–100.00)

## 2015-09-02 NOTE — Progress Notes (Signed)
Subjective:  Patient ID: Melissa Bonilla, female    DOB: 07-22-1945  Age: 70 y.o. MRN: [id number]  CC: The primary encounter diagnosis was Breast mass. Diagnoses of Hyperlipidemia with target LDL less than 100, Hypovitaminosis D, Obesity, and Abnormal mammogram of left breast were also pertinent to this visit.  HPI Melissa Bonilla presents for 6 month follow up on GAD,  Hyperlipidemia,  back pain and abnormal mammogram.   1) Hyperlipidemia:  She was advised to start  simvastatin for risk reduction and primary prevention of CAD but deferred treatment last year in favor of dietary modification. She is fasting today.  2) Abnormal mammogram left.  In October  She was noted to have a 3 mm mass, favored to be a cyst on the left, and 6 month follow up mammogram is due   3)GAD:  Managed with lexapro .  Taking med as directed,  Sleeping ok,  No panic attacks.   Outpatient Prescriptions Prior to Visit  Medication Sig Dispense Refill  . gabapentin (NEURONTIN) 300 MG capsule Take 1 capsule (300 mg total) by mouth 3 (three) times daily. (Patient not taking: Reported on 09/02/2015) 270 capsule 3  . oxybutynin (DITROPAN-XL) 5 MG 24 hr tablet Take 1 tablet (5 mg total) by mouth daily. 90 tablet 3  . Tdap (BOOSTRIX) 5-2.5-18.5 LF-MCG/0.5 injection Inject 0.5 mLs into the muscle once. (Patient not taking: Reported on 09/02/2015) 0.5 mL 0  . ergocalciferol (DRISDOL) 50000 UNITS capsule Take 1 capsule (50,000 Units total) by mouth once a week. (Patient not taking: Reported on 09/02/2015) 4 capsule 0  . escitalopram (LEXAPRO) 20 MG tablet Take 1 tablet (20 mg total) by mouth daily. 90 tablet 0   No facility-administered medications prior to visit.    Review of Systems;  Patient denies headache, fevers, malaise, unintentional weight loss, skin rash, eye pain, sinus congestion and sinus pain, sore throat, dysphagia,  hemoptysis , cough, dyspnea, wheezing, chest pain, palpitations, orthopnea, edema, abdominal  pain, nausea, melena, diarrhea, constipation, flank pain, dysuria, hematuria, urinary  Frequency, nocturia, numbness, tingling, seizures,  Focal weakness, Loss of consciousness,  Tremor, insomnia, depression, anxiety, and suicidal ideation.      Objective:  BP 126/74 mmHg  Pulse 61  Temp(Src) 98.1 F (36.7 C) (Oral)  Resp 12  Ht 5\' 1"  (1.549 m)  Wt 161 lb (73.029 kg)  BMI 30.44 kg/m2  SpO2 95%  BP Readings from Last 3 Encounters:  09/02/15 126/74  03/05/15 138/72  01/12/14 140/70    Wt Readings from Last 3 Encounters:  09/02/15 161 lb (73.029 kg)  03/05/15 141 lb (63.957 kg)  01/12/14 156 lb 4 oz (70.875 kg)    General appearance: alert, cooperative and appears stated age Ears: normal TM's and external ear canals both ears Throat: lips, mucosa, and tongue normal; teeth and gums normal Neck: no adenopathy, no carotid bruit, supple, symmetrical, trachea midline and thyroid not enlarged, symmetric, no tenderness/mass/nodules Back: symmetric, no curvature. ROM normal. No CVA tenderness. Lungs: clear to auscultation bilaterally Heart: regular rate and rhythm, S1, S2 normal, no murmur, click, rub or gallop Abdomen: soft, non-tender; bowel sounds normal; no masses,  no organomegaly Pulses: 2+ and symmetric Skin: Skin color, texture, turgor normal. No rashes or lesions Lymph nodes: Cervical, supraclavicular, and axillary nodes normal.  No results found for: HGBA1C  Lab Results  Component Value Date   CREATININE 0.74 09/02/2015   CREATININE 0.72 03/22/2015   CREATININE 0.8 01/12/2014    Lab Results  Component  Value Date   WBC 5.9 01/12/2014   HGB 14.2 01/12/2014   HCT 41.0 01/12/2014   PLT 215.0 01/12/2014   GLUCOSE 99 09/02/2015   CHOL 234* 09/02/2015   TRIG 123.0 09/02/2015   HDL 45.00 09/02/2015   LDLDIRECT 175.5 03/19/2013   LDLCALC 164* 09/02/2015   ALT 22 09/02/2015   AST 19 09/02/2015   NA 137 09/02/2015   K 4.8 09/02/2015   CL 101 09/02/2015    CREATININE 0.74 09/02/2015   BUN 15 09/02/2015   CO2 31 09/02/2015   TSH 1.35 03/22/2015    US Breast Ltd Uni Left Inc Axilla  03/24/2015  CLINICAL DATA:  70 year old female presenting as a screening recall for a possible left breast mass. EXAM: DIGITAL DIAGNOSTIC LEFT MAMMOGRAM WITH 3D TOMOSYNTHESIS WITH CAD ULTRASOUND LEFT BREAST COMPARISON:  Previous exams. ACR Breast Density Category b: There are scattered areas of fibroglandular density. FINDINGS: In the inferior left breast there are multiple tiny round and oval circumscribed masses, suggestive of fibrocystic change. The specific possible mass of concern persists on the spot tomosynthesis images, demonstrating a a 3 mm partially circumscribed, partially obscured mass in the lower slightly outer quadrant. Mammographic images were processed with CAD. On physical exam, no distinct palpable masses are identified in the lower outer quadrant of the left breast. Targeted ultrasound is performed, showing a circumscribed isoechoic mass at 5 o'clock, 2 cm from the nipple measuring 3 mm. This may corresponds with the mammographic area of concern, however there are other tiny benign cysts in the vicinity. No suspicious masses or areas of shadowing are identified. IMPRESSION: 1. The 3 mm isoechoic mass at 5 o'clock in the left breast may correspond with the mammographic mass of concern. However, as there are multiple tiny benign cysts in the same vicinity, a follow-up mammogram and ultrasound will be performed to ensure continued stability. This likely represents a small cyst, however a tiny solid mass cannot be excluded. RECOMMENDATION: Six-month follow-up mammogram and ultrasound of the left breast to monitor the 3 mm mass in the lower slightly outer quadrant. I have discussed the findings and recommendations with the patient. Results were also provided in writing at the conclusion of the visit. If applicable, a reminder letter will be sent to the patient  regarding the next appointment. BI-RADS CATEGORY  3: Probably benign. Electronically Signed   By: Ammie Ferrier M.D.   On: 03/24/2015 13:20   Mm Diag Breast Tomo Uni Left  03/24/2015  CLINICAL DATA:  70 year old female presenting as a screening recall for a possible left breast mass. EXAM: DIGITAL DIAGNOSTIC LEFT MAMMOGRAM WITH 3D TOMOSYNTHESIS WITH CAD ULTRASOUND LEFT BREAST COMPARISON:  Previous exams. ACR Breast Density Category b: There are scattered areas of fibroglandular density. FINDINGS: In the inferior left breast there are multiple tiny round and oval circumscribed masses, suggestive of fibrocystic change. The specific possible mass of concern persists on the spot tomosynthesis images, demonstrating a a 3 mm partially circumscribed, partially obscured mass in the lower slightly outer quadrant. Mammographic images were processed with CAD. On physical exam, no distinct palpable masses are identified in the lower outer quadrant of the left breast. Targeted ultrasound is performed, showing a circumscribed isoechoic mass at 5 o'clock, 2 cm from the nipple measuring 3 mm. This may corresponds with the mammographic area of concern, however there are other tiny benign cysts in the vicinity. No suspicious masses or areas of shadowing are identified. IMPRESSION: 1. The 3 mm isoechoic mass at 5 o'clock  in the left breast may correspond with the mammographic mass of concern. However, as there are multiple tiny benign cysts in the same vicinity, a follow-up mammogram and ultrasound will be performed to ensure continued stability. This likely represents a small cyst, however a tiny solid mass cannot be excluded. RECOMMENDATION: Six-month follow-up mammogram and ultrasound of the left breast to monitor the 3 mm mass in the lower slightly outer quadrant. I have discussed the findings and recommendations with the patient. Results were also provided in writing at the conclusion of the visit. If applicable, a  reminder letter will be sent to the patient regarding the next appointment. BI-RADS CATEGORY  3: Probably benign. Electronically Signed   By: Ammie Ferrier M.D.   On: 03/24/2015 13:20    Assessment & Plan:   Problem List Items Addressed This Visit    Abnormal mammogram of left breast    6 month follow up on  Nonpalpable 3 mm isoechoic mass found on ultrasound is due and will be ordered.       Hyperlipidemia with target LDL less than 100    New ACC guidelines recommend starting patients aged 21 or higher on moderate intensity statin therapy for LDL between 70-189 and 10 yr risk of CAD > 7.5% ;  and high intensity therapy for anyone with LDL > 190.  Her LDL is 164 and 10 yr risk is still > 7.5% .  I have again recommended a trial of simvastatin  Or if not interested,  Red Yeast Rice . Aspirin advised.  Lab Results  Component Value Date   CHOL 234* 09/02/2015   HDL 45.00 09/02/2015   LDLCALC 164* 09/02/2015   LDLDIRECT 175.5 03/19/2013   TRIG 123.0 09/02/2015   CHOLHDL 5 09/02/2015            Relevant Orders   Lipid panel (Completed)   Obesity    Weight gain addressed.  I have addressed  BMI and recommended wt loss of 10% of body weight over the next 6 months using a low fat, fruit/vegetable based [place]iter[place] diet and regular exercise a minimum of 5 days per week.        Relevant Orders   Comprehensive metabolic panel (Completed)   Hypovitaminosis D   Relevant Orders   VITAMIN D 25 Hydroxy (Vit-D Deficiency, Fractures) (Completed)    Other Visit Diagnoses    Breast mass    -  Primary    Relevant Orders    MM Digital Diagnostic Bilat       I have discontinued Ms. Mcclay's ergocalciferol. I am also having her maintain her gabapentin, oxybutynin, Tdap, and Vitamin D3.  Meds ordered this encounter  Medications  . Cholecalciferol (VITAMIN D3) 1000 units CAPS    Sig: Take 1,000 Units by mouth daily.   A total of 25 minutes of face to face time was spent with patient  more than half of which was spent in counselling about the above mentioned conditions  and coordination of care  Medications Discontinued During This Encounter  Medication Reason  . ergocalciferol (DRISDOL) 50000 UNITS capsule Completed Course    Follow-up: No Follow-up on file.   Crecencio Mc, MD

## 2015-09-02 NOTE — Patient Instructions (Signed)
  To make a low carb chip :  Take the Joseph[employer] Lavash or Pita bread,  Or the Mission Low carb whole wheat tortilla   Place on metal cookie sheet  Brush with olive oil  Sprinkle garlic powder (NOT garlic salt), grated parmesan cheese, mediterranean seasoning , or all of them?  Bake at 225 or 250 for 90 minutes    For dessert:  Try the Dannon Lt n Fit greek yogurt dessert flavors and top with reddi Whip .  8 carbs,  80 calories    For breakfast:   Danton Clap now makes a frozen breakfast frittata that can be microwaved in 2 minutes and is very low carb. Frittats are similar to quiches without the crust  Premier Protein low carb shakes are delicious and very low carb.       Make your goal for exercise 30 minutes 5 days per week

## 2015-09-02 NOTE — Progress Notes (Signed)
Pre-visit discussion using our clinic review tool. No additional management support is needed unless otherwise documented below in the visit note.  

## 2015-09-03 ENCOUNTER — Other Ambulatory Visit: Payer: Self-pay | Admitting: Internal Medicine

## 2015-09-04 ENCOUNTER — Encounter: Payer: Self-pay | Admitting: Internal Medicine

## 2015-09-05 ENCOUNTER — Encounter: Payer: Self-pay | Admitting: Internal Medicine

## 2015-09-05 NOTE — Assessment & Plan Note (Signed)
6 month follow up on  Nonpalpable 3 mm isoechoic mass found on ultrasound is due and will be ordered.

## 2015-09-05 NOTE — Assessment & Plan Note (Signed)
New ACC guidelines recommend starting patients aged 70 or higher on moderate intensity statin therapy for LDL between 70-189 and 10 yr risk of CAD > 7.5% ;  and high intensity therapy for anyone with LDL > 190.  Her LDL is 164 and 10 yr risk is still > 7.5% .  I have again recommended a trial of simvastatin  Or if not interested,  Red Yeast Rice . Aspirin advised.  Lab Results  Component Value Date   CHOL 234* 09/02/2015   HDL 45.00 09/02/2015   LDLCALC 164* 09/02/2015   LDLDIRECT 175.5 03/19/2013   TRIG 123.0 09/02/2015   CHOLHDL 5 09/02/2015

## 2015-09-05 NOTE — Assessment & Plan Note (Signed)
Weight gain addressed.  I have addressed  BMI and recommended wt loss of 10% of body weight over the next 6 months using a low fat, fruit/vegetable based [place]anean diet and regular exercise a minimum of 5 days per week.

## 2015-09-06 MED ORDER — ATORVASTATIN CALCIUM 20 MG PO TABS: 20.0000 mg | ORAL_TABLET | Freq: Every day | ORAL | Status: DC

## 2015-09-06 NOTE — Telephone Encounter (Signed)
Willing to start statin therapy.

## 2015-09-22 ENCOUNTER — Other Ambulatory Visit: Payer: Self-pay | Admitting: Internal Medicine

## 2015-09-22 DIAGNOSIS — R928 Other abnormal and inconclusive findings on diagnostic imaging of breast: Secondary | ICD-10-CM

## 2015-09-28 ENCOUNTER — Other Ambulatory Visit: Payer: Self-pay | Admitting: Internal Medicine

## 2015-09-28 DIAGNOSIS — R928 Other abnormal and inconclusive findings on diagnostic imaging of breast: Secondary | ICD-10-CM

## 2015-10-14 ENCOUNTER — Other Ambulatory Visit: Payer: Self-pay | Admitting: Internal Medicine

## 2015-10-14 ENCOUNTER — Ambulatory Visit
Admission: RE | Admit: 2015-10-14 | Discharge: 2015-10-14 | Disposition: A | Discharge status: Home / Self Care | Payer: Commercial Managed Care - HMO | Source: Ambulatory Visit | Attending: Internal Medicine | Admitting: Internal Medicine

## 2015-10-14 DIAGNOSIS — N63 Unspecified lump in unspecified breast: Secondary | ICD-10-CM

## 2015-10-14 DIAGNOSIS — N6489 Other specified disorders of breast: Secondary | ICD-10-CM | POA: Diagnosis not present

## 2015-10-14 DIAGNOSIS — R928 Other abnormal and inconclusive findings on diagnostic imaging of breast: Secondary | ICD-10-CM

## 2015-10-17 ENCOUNTER — Other Ambulatory Visit: Payer: Self-pay | Admitting: Internal Medicine

## 2015-10-17 DIAGNOSIS — R928 Other abnormal and inconclusive findings on diagnostic imaging of breast: Secondary | ICD-10-CM

## 2016-01-03 DIAGNOSIS — Z0100 Encounter for examination of eyes and vision without abnormal findings: Secondary | ICD-10-CM | POA: Diagnosis not present

## 2016-01-03 DIAGNOSIS — E7800 Pure hypercholesterolemia, unspecified: Secondary | ICD-10-CM | POA: Diagnosis not present

## 2016-04-11 ENCOUNTER — Other Ambulatory Visit: Payer: Self-pay | Admitting: Internal Medicine

## 2016-04-11 DIAGNOSIS — R928 Other abnormal and inconclusive findings on diagnostic imaging of breast: Secondary | ICD-10-CM

## 2016-04-12 ENCOUNTER — Encounter: Payer: Self-pay | Admitting: Internal Medicine

## 2016-05-08 ENCOUNTER — Ambulatory Visit
Admission: RE | Admit: 2016-05-08 | Discharge: 2016-05-08 | Disposition: A | Discharge status: Home / Self Care | Payer: Commercial Managed Care - HMO | Source: Ambulatory Visit | Attending: Internal Medicine | Admitting: Internal Medicine

## 2016-05-08 ENCOUNTER — Other Ambulatory Visit: Payer: Self-pay | Admitting: Internal Medicine

## 2016-05-08 DIAGNOSIS — N6323 Unspecified lump in the left breast, lower outer quadrant: Secondary | ICD-10-CM | POA: Diagnosis not present

## 2016-05-08 DIAGNOSIS — R928 Other abnormal and inconclusive findings on diagnostic imaging of breast: Secondary | ICD-10-CM | POA: Diagnosis not present

## 2016-05-08 DIAGNOSIS — N6320 Unspecified lump in the left breast, unspecified quadrant: Secondary | ICD-10-CM | POA: Insufficient documentation

## 2016-07-05 ENCOUNTER — Other Ambulatory Visit: Payer: Self-pay | Admitting: Internal Medicine

## 2016-07-28 ENCOUNTER — Telehealth: Payer: Self-pay | Admitting: Internal Medicine

## 2016-07-28 NOTE — Telephone Encounter (Signed)
I called and left pt a vm to call the office to sch AWV. Thank you!

## 2016-09-25 ENCOUNTER — Other Ambulatory Visit: Payer: Self-pay | Admitting: Internal Medicine

## 2016-09-25 NOTE — Telephone Encounter (Signed)
Pt last refill was on 07/07/16.  Last labs were on 09/01/16, with no future scheduled appt. Ok to refill?

## 2016-09-26 NOTE — Telephone Encounter (Signed)
Incorrect: last labs were March 2017, not 2018!  Refill denied .

## 2016-10-02 ENCOUNTER — Telehealth: Payer: Self-pay | Admitting: Internal Medicine

## 2016-10-02 NOTE — Telephone Encounter (Signed)
Left pt message asking to call Allison back directly at [phone] to schedule AWV. Thanks! °

## 2016-10-25 NOTE — Telephone Encounter (Signed)
Left pt message asking to call Allison back directly at [phone] to schedule AWV. Thanks! °

## 2017-09-17 DIAGNOSIS — C44722 Squamous cell carcinoma of skin of right lower limb, including hip: Secondary | ICD-10-CM | POA: Diagnosis not present

## 2017-09-17 DIAGNOSIS — D485 Neoplasm of uncertain behavior of skin: Secondary | ICD-10-CM | POA: Diagnosis not present

## 2017-10-08 DIAGNOSIS — C44722 Squamous cell carcinoma of skin of right lower limb, including hip: Secondary | ICD-10-CM | POA: Diagnosis not present

## 2017-10-22 DIAGNOSIS — T8140XA Infection following a procedure, unspecified, initial encounter: Secondary | ICD-10-CM | POA: Diagnosis not present

## 2017-10-29 DIAGNOSIS — D2272 Melanocytic nevi of left lower limb, including hip: Secondary | ICD-10-CM | POA: Diagnosis not present

## 2017-10-29 DIAGNOSIS — L821 Other seborrheic keratosis: Secondary | ICD-10-CM | POA: Diagnosis not present

## 2017-10-29 DIAGNOSIS — Z08 Encounter for follow-up examination after completed treatment for malignant neoplasm: Secondary | ICD-10-CM | POA: Diagnosis not present

## 2017-10-29 DIAGNOSIS — D2271 Melanocytic nevi of right lower limb, including hip: Secondary | ICD-10-CM | POA: Diagnosis not present

## 2017-10-29 DIAGNOSIS — D2262 Melanocytic nevi of left upper limb, including shoulder: Secondary | ICD-10-CM | POA: Diagnosis not present

## 2017-10-29 DIAGNOSIS — D2261 Melanocytic nevi of right upper limb, including shoulder: Secondary | ICD-10-CM | POA: Diagnosis not present

## 2017-10-29 DIAGNOSIS — Z85828 Personal history of other malignant neoplasm of skin: Secondary | ICD-10-CM | POA: Diagnosis not present

## 2018-01-07 DIAGNOSIS — H524 Presbyopia: Secondary | ICD-10-CM | POA: Diagnosis not present

## 2018-01-07 DIAGNOSIS — H2510 Age-related nuclear cataract, unspecified eye: Secondary | ICD-10-CM | POA: Diagnosis not present

## 2018-03-11 DIAGNOSIS — D485 Neoplasm of uncertain behavior of skin: Secondary | ICD-10-CM | POA: Diagnosis not present

## 2018-03-25 DIAGNOSIS — D485 Neoplasm of uncertain behavior of skin: Secondary | ICD-10-CM | POA: Diagnosis not present

## 2018-03-25 DIAGNOSIS — C44729 Squamous cell carcinoma of skin of left lower limb, including hip: Secondary | ICD-10-CM | POA: Diagnosis not present

## 2018-07-01 DIAGNOSIS — D225 Melanocytic nevi of trunk: Secondary | ICD-10-CM | POA: Diagnosis not present

## 2018-07-01 DIAGNOSIS — D2272 Melanocytic nevi of left lower limb, including hip: Secondary | ICD-10-CM | POA: Diagnosis not present

## 2018-07-01 DIAGNOSIS — Z85828 Personal history of other malignant neoplasm of skin: Secondary | ICD-10-CM | POA: Diagnosis not present

## 2018-07-01 DIAGNOSIS — D2261 Melanocytic nevi of right upper limb, including shoulder: Secondary | ICD-10-CM | POA: Diagnosis not present

## 2018-07-01 DIAGNOSIS — X32XXXA Exposure to sunlight, initial encounter: Secondary | ICD-10-CM | POA: Diagnosis not present

## 2018-07-01 DIAGNOSIS — D2262 Melanocytic nevi of left upper limb, including shoulder: Secondary | ICD-10-CM | POA: Diagnosis not present

## 2018-07-01 DIAGNOSIS — L570 Actinic keratosis: Secondary | ICD-10-CM | POA: Diagnosis not present

## 2018-11-25 ENCOUNTER — Inpatient Hospital Stay
Admission: EM | Admit: 2018-11-25 | Discharge: 2018-12-01 | DRG: 193 | Disposition: A | Discharge status: Home / Self Care | Payer: Medicare HMO | Attending: Internal Medicine | Admitting: Internal Medicine

## 2018-11-25 ENCOUNTER — Emergency Department: Payer: Medicare HMO

## 2018-11-25 ENCOUNTER — Encounter: Payer: Self-pay | Admitting: Emergency Medicine

## 2018-11-25 ENCOUNTER — Other Ambulatory Visit: Payer: Self-pay

## 2018-11-25 ENCOUNTER — Ambulatory Visit: Payer: Self-pay | Admitting: *Deleted

## 2018-11-25 DIAGNOSIS — I361 Nonrheumatic tricuspid (valve) insufficiency: Secondary | ICD-10-CM | POA: Diagnosis not present

## 2018-11-25 DIAGNOSIS — I4891 Unspecified atrial fibrillation: Secondary | ICD-10-CM | POA: Diagnosis present

## 2018-11-25 DIAGNOSIS — I4892 Unspecified atrial flutter: Secondary | ICD-10-CM | POA: Diagnosis not present

## 2018-11-25 DIAGNOSIS — I509 Heart failure, unspecified: Secondary | ICD-10-CM | POA: Diagnosis not present

## 2018-11-25 DIAGNOSIS — Z8249 Family history of ischemic heart disease and other diseases of the circulatory system: Secondary | ICD-10-CM | POA: Diagnosis not present

## 2018-11-25 DIAGNOSIS — J95821 Acute postprocedural respiratory failure: Secondary | ICD-10-CM | POA: Diagnosis not present

## 2018-11-25 DIAGNOSIS — J690 Pneumonitis due to inhalation of food and vomit: Secondary | ICD-10-CM

## 2018-11-25 DIAGNOSIS — R079 Chest pain, unspecified: Secondary | ICD-10-CM | POA: Diagnosis not present

## 2018-11-25 DIAGNOSIS — R0603 Acute respiratory distress: Secondary | ICD-10-CM

## 2018-11-25 DIAGNOSIS — Z79899 Other long term (current) drug therapy: Secondary | ICD-10-CM

## 2018-11-25 DIAGNOSIS — Z972 Presence of dental prosthetic device (complete) (partial): Secondary | ICD-10-CM | POA: Diagnosis not present

## 2018-11-25 DIAGNOSIS — Z803 Family history of malignant neoplasm of breast: Secondary | ICD-10-CM

## 2018-11-25 DIAGNOSIS — Z885 Allergy status to narcotic agent status: Secondary | ICD-10-CM

## 2018-11-25 DIAGNOSIS — T4275XA Adverse effect of unspecified antiepileptic and sedative-hypnotic drugs, initial encounter: Secondary | ICD-10-CM | POA: Diagnosis not present

## 2018-11-25 DIAGNOSIS — I429 Cardiomyopathy, unspecified: Secondary | ICD-10-CM | POA: Diagnosis not present

## 2018-11-25 DIAGNOSIS — T886XXA Anaphylactic reaction due to adverse effect of correct drug or medicament properly administered, initial encounter: Secondary | ICD-10-CM | POA: Diagnosis not present

## 2018-11-25 DIAGNOSIS — I340 Nonrheumatic mitral (valve) insufficiency: Secondary | ICD-10-CM | POA: Diagnosis not present

## 2018-11-25 DIAGNOSIS — I428 Other cardiomyopathies: Secondary | ICD-10-CM

## 2018-11-25 DIAGNOSIS — I5021 Acute systolic (congestive) heart failure: Secondary | ICD-10-CM | POA: Diagnosis not present

## 2018-11-25 DIAGNOSIS — J9601 Acute respiratory failure with hypoxia: Secondary | ICD-10-CM | POA: Diagnosis not present

## 2018-11-25 DIAGNOSIS — R0789 Other chest pain: Secondary | ICD-10-CM | POA: Diagnosis not present

## 2018-11-25 DIAGNOSIS — Z87891 Personal history of nicotine dependence: Secondary | ICD-10-CM | POA: Diagnosis not present

## 2018-11-25 DIAGNOSIS — J811 Chronic pulmonary edema: Secondary | ICD-10-CM | POA: Diagnosis not present

## 2018-11-25 DIAGNOSIS — I420 Dilated cardiomyopathy: Secondary | ICD-10-CM

## 2018-11-25 DIAGNOSIS — Z20828 Contact with and (suspected) exposure to other viral communicable diseases: Secondary | ICD-10-CM | POA: Diagnosis not present

## 2018-11-25 DIAGNOSIS — R0600 Dyspnea, unspecified: Secondary | ICD-10-CM

## 2018-11-25 DIAGNOSIS — R0602 Shortness of breath: Secondary | ICD-10-CM

## 2018-11-25 DIAGNOSIS — Y92230 Patient room in hospital as the place of occurrence of the external cause: Secondary | ICD-10-CM | POA: Diagnosis not present

## 2018-11-25 DIAGNOSIS — J189 Pneumonia, unspecified organism: Principal | ICD-10-CM | POA: Diagnosis present

## 2018-11-25 DIAGNOSIS — I441 Atrioventricular block, second degree: Secondary | ICD-10-CM | POA: Diagnosis not present

## 2018-11-25 DIAGNOSIS — Z882 Allergy status to sulfonamides status: Secondary | ICD-10-CM | POA: Diagnosis not present

## 2018-11-25 DIAGNOSIS — T506X5A Adverse effect of antidotes and chelating agents, initial encounter: Secondary | ICD-10-CM | POA: Diagnosis not present

## 2018-11-25 DIAGNOSIS — E785 Hyperlipidemia, unspecified: Secondary | ICD-10-CM | POA: Diagnosis present

## 2018-11-25 DIAGNOSIS — Z8701 Personal history of pneumonia (recurrent): Secondary | ICD-10-CM | POA: Diagnosis not present

## 2018-11-25 HISTORY — DX: Hyperlipidemia, unspecified: E78.5

## 2018-11-25 HISTORY — DX: Depression, unspecified: F32.A

## 2018-11-25 LAB — CBC
HCT: 43.1 % (ref 36.0–46.0)
Hemoglobin: 14.5 g/dL (ref 12.0–15.0)
MCH: 30.6 pg (ref 26.0–34.0)
MCHC: 33.6 g/dL (ref 30.0–36.0)
MCV: 90.9 fL (ref 80.0–100.0)
Platelets: 176 10*3/uL (ref 150–400)
RBC: 4.74 MIL/uL (ref 3.87–5.11)
RDW: 12.4 % (ref 11.5–15.5)
WBC: 12.3 10*3/uL — ABNORMAL HIGH (ref 4.0–10.5)
nRBC: 0 % (ref 0.0–0.2)

## 2018-11-25 LAB — BASIC METABOLIC PANEL
Anion gap: 8 (ref 5–15)
BUN: 11 mg/dL (ref 8–23)
CO2: 24 mmol/L (ref 22–32)
Calcium: 8.9 mg/dL (ref 8.9–10.3)
Chloride: 104 mmol/L (ref 98–111)
Creatinine, Ser: 0.83 mg/dL (ref 0.44–1.00)
GFR calc Af Amer: >60 mL/min (ref >60)
GFR calc non Af Amer: >60 mL/min (ref >60)
Glucose, Bld: 126 mg/dL — ABNORMAL HIGH (ref 70–99)
Potassium: 4.2 mmol/L (ref 3.5–5.1)
Sodium: 136 mmol/L (ref 135–145)

## 2018-11-25 LAB — SARS CORONAVIRUS 2 BY RT PCR (HOSPITAL ORDER, PERFORMED IN ~~LOC~~ HOSPITAL LAB): SARS Coronavirus 2: NEGATIVE

## 2018-11-25 LAB — LACTIC ACID, PLASMA: Lactic Acid, Venous: 1 mmol/L (ref 0.5–1.9)

## 2018-11-25 LAB — TROPONIN I: Troponin I: <0.03 ng/mL (ref <0.03)

## 2018-11-25 MED ORDER — SODIUM CHLORIDE 0.9 % IV BOLUS
500.0000 mL | Freq: Once | INTRAVENOUS | Status: AC
  Administered 2018-11-25: 500 mL via INTRAVENOUS

## 2018-11-25 MED ORDER — IOHEXOL 350 MG/ML SOLN
75.0000 mL | Freq: Once | INTRAVENOUS | Status: AC | PRN
  Administered 2018-11-25: 75 mL via INTRAVENOUS

## 2018-11-25 MED ORDER — MAGNESIUM SULFATE 2 GM/50ML IV SOLN
INTRAVENOUS | Status: AC
  Administered 2018-11-25: 20:00:00
  Filled 2018-11-25: qty 50

## 2018-11-25 MED ORDER — SODIUM CHLORIDE 0.9 % IV BOLUS
1000.0000 mL | Freq: Once | INTRAVENOUS | Status: AC
  Administered 2018-11-25: 1000 mL via INTRAVENOUS

## 2018-11-25 MED ORDER — SODIUM CHLORIDE 0.9 % IV SOLN
500.0000 mg | INTRAVENOUS | Status: AC
  Administered 2018-11-25 – 2018-11-27 (×3): 500 mg via INTRAVENOUS
  Filled 2018-11-25 (×3): qty 500

## 2018-11-25 MED ORDER — SODIUM CHLORIDE 0.9 % IV SOLN
2.0000 g | INTRAVENOUS | Status: DC
  Administered 2018-11-25: 2 g via INTRAVENOUS
  Filled 2018-11-25: qty 20

## 2018-11-25 MED ORDER — ACETAMINOPHEN 500 MG PO TABS
1000.0000 mg | ORAL_TABLET | Freq: Once | ORAL | Status: AC
  Administered 2018-11-25: 1000 mg via ORAL

## 2018-11-25 MED ORDER — FENTANYL CITRATE (PF) 100 MCG/2ML IJ SOLN
50.0000 ug | INTRAMUSCULAR | Status: DC | PRN
  Administered 2018-11-25: 50 ug via INTRAVENOUS
  Filled 2018-11-25: qty 2

## 2018-11-25 MED ORDER — DILTIAZEM HCL 25 MG/5ML IV SOLN
10.0000 mg | Freq: Once | INTRAVENOUS | Status: AC
  Administered 2018-11-25: 10 mg via INTRAVENOUS
  Filled 2018-11-25: qty 5

## 2018-11-25 MED ORDER — SODIUM CHLORIDE 0.9% FLUSH: 3.0000 mL | Freq: Once | INTRAVENOUS | Status: DC

## 2018-11-25 MED ORDER — DILTIAZEM HCL 100 MG IV SOLR
5.0000 mg/h | INTRAVENOUS | Status: DC
  Administered 2018-11-25: 12.5 mg/h via INTRAVENOUS
  Filled 2018-11-25 (×2): qty 100

## 2018-11-25 NOTE — ED Notes (Signed)
Pt states pain has not changed since arrival

## 2018-11-25 NOTE — ED Notes (Signed)
Patient stated pain is now a 8/10 in her "Throat". Pain medicine given.

## 2018-11-25 NOTE — ED Notes (Signed)
ED TO INPATIENT HANDOFF REPORT  ED Nurse Name and Phone #: Tessie Fass [phone]  S Name/Age/Gender Melissa Bonilla 73 y.o. female Room/Bed: ED08A/ED08A  Code Status   Code Status: Not on file  Home/SNF/Other Home Patient oriented to: self, place, time and situation Is this baseline? Yes   Triage Complete: Triage complete  Chief Complaint Chest Pain  [facility]  Triage Note Chest pain and SOB since yesterday. Sore throat.    Allergies Allergies  Allergen Reactions  . Codeine Nausea Only and Rash  . Morphine Nausea And Vomiting    Pt allergy may be codeine as codeine or morphine were combined on previous health history. Pt reported upset stomach on questionnaire.  . Sulfa Antibiotics Nausea And Vomiting    Level of Care/Admitting Diagnosis ED Disposition    ED Disposition Condition [facility] Area: [place] [[id number]]  Level of Care: Telemetry [5]  Covid Evaluation: Confirmed COVID Negative  Diagnosis: PNA (pneumonia) [[id number]]  Admitting Physician: Christel Mormon [[id number]]  Attending Physician: Christel Mormon [[id number]]  Estimated length of stay: 3 - 4 days  Certification:: I certify this patient will need inpatient services for at least 2 midnights  PT Class (Do Not Modify): Inpatient [101]  PT Acc Code (Do Not Modify): Private [1]       B Medical/Surgery History Past Medical History:  Diagnosis Date  . Menopausal syndrome (hot flashes) 1992   used HRT fo 2 yrs    Past Surgical History:  Procedure Laterality Date  . APPENDECTOMY  1972  . [place]     A IV Location/Drains/Wounds Patient Lines/Drains/Airways Status   Active Line/Drains/Airways    Name:   Placement date:   Placement time:   Site:   Days:   Peripheral IV 11/25/18 Left Arm   11/25/18    1901    Arm   less than 1   Peripheral IV 11/25/18 Right Antecubital   11/25/18    1948    Antecubital   less than 1          Intake/Output Last 24  hours  Intake/Output Summary (Last 24 hours) at 11/25/2018 2232 Last data filed at 11/25/2018 2154 Gross per 24 hour  Intake 250 ml  Output -  Net 250 ml    Labs/Imaging Results for orders placed or performed during the hospital encounter of 11/25/18 (from the past 48 hour(s))  Basic metabolic panel     Status: Abnormal   Collection Time: 11/25/18  6:39 PM  Result Value Ref Range   Sodium 136 135 - 145 mmol/L   Potassium 4.2 3.5 - 5.1 mmol/L   Chloride 104 98 - 111 mmol/L   CO2 24 22 - 32 mmol/L   Glucose, Bld 126 (H) 70 - 99 mg/dL   BUN 11 8 - 23 mg/dL   Creatinine, Ser 0.83 0.44 - 1.00 mg/dL   Calcium 8.9 8.9 - 10.3 mg/dL   GFR calc non Af Amer >60 >60 mL/min   GFR calc Af Amer >60 >60 mL/min   Anion gap 8 5 - 15    Comment: Performed at [facility], [address] [place], [place] [address]  CBC     Status: Abnormal   Collection Time: 11/25/18  6:39 PM  Result Value Ref Range   WBC 12.3 (H) 4.0 - 10.5 K/uL   RBC 4.74 3.87 - 5.11 MIL/uL   Hemoglobin 14.5 12.0 - 15.0  g/dL   HCT 43.1 36.0 - 46.0 %   MCV 90.9 80.0 - 100.0 fL   MCH 30.6 26.0 - 34.0 pg   MCHC 33.6 30.0 - 36.0 g/dL   RDW 12.4 11.5 - 15.5 %   Platelets 176 150 - 400 K/uL   nRBC 0.0 0.0 - 0.2 %    Comment: Performed at [facility], [address] [place], [place] [address]  Troponin I - ONCE - STAT     Status: None   Collection Time: 11/25/18  6:39 PM  Result Value Ref Range   Troponin I <0.03 <0.03 ng/mL    Comment: Performed at [facility], [address] [place],  [address]  Lactic acid, plasma     Status: None   Collection Time: 11/25/18  7:12 PM  Result Value Ref Range   Lactic Acid, Venous 1.0 0.5 - 1.9 mmol/L    Comment: Performed at [facility], [address], [place],  [address]  SARS Coronavirus 2 ([place]- Performed in [place] hospital lab), Hosp Order     Status: None   Collection Time: 11/25/18  7:28 PM  Result Value Ref Range    SARS Coronavirus 2 NEGATIVE NEGATIVE    Comment: (NOTE) If result is NEGATIVE SARS-CoV-2 target nucleic acids are NOT DETECTED. The SARS-CoV-2 RNA is generally detectable in upper and lower  respiratory specimens during the acute phase of infection. The lowest  concentration of SARS-CoV-2 viral copies this assay can detect is 250  copies / mL. A negative result does not preclude SARS-CoV-2 infection  and should not be used as the sole basis for treatment or other  patient management decisions.  A negative result may occur with  improper specimen collection / handling, submission of specimen other  than nasopharyngeal swab, presence of viral mutation(s) within the  areas targeted by this assay, and inadequate number of viral copies  (<250 copies / mL). A negative result must be combined with clinical  observations, patient history, and epidemiological information. If result is POSITIVE SARS-CoV-2 target nucleic acids are DETECTED. The SARS-CoV-2 RNA is generally detectable in upper and lower  respiratory specimens dur ing the acute phase of infection.  Positive  results are indicative of active infection with SARS-CoV-2.  Clinical  correlation with patient history and other diagnostic information is  necessary to determine patient infection status.  Positive results do  not rule out bacterial infection or co-infection with other viruses. If result is PRESUMPTIVE POSTIVE SARS-CoV-2 nucleic acids MAY BE PRESENT.   A presumptive positive result was obtained on the submitted specimen  and confirmed on repeat testing.  While 2019 novel coronavirus  (SARS-CoV-2) nucleic acids may be present in the submitted sample  additional confirmatory testing may be necessary for epidemiological  and / or clinical management purposes  to differentiate between  SARS-CoV-2 and other Sarbecovirus currently known to infect humans.  If clinically indicated additional testing with an alternate test   methodology [phone]) is advised. The SARS-CoV-2 RNA is generally  detectable in upper and lower respiratory sp ecimens during the acute  phase of infection. The expected result is Negative. Fact Sheet for Patients:  StrictlyIdeas.no Fact Sheet for Healthcare Providers: BankingDealers.co.za This test is not yet approved or cleared by the [place] FDA and has been authorized for detection and/or diagnosis of SARS-CoV-2 by FDA under an Emergency Use Authorization (EUA).  This EUA will remain in effect (meaning this test can be used) for the duration  of the COVID-19 declaration under Section 564(b)(1) of the Act, 21 U.S.C. section 360bbb-3(b)(1), unless the authorization is terminated or revoked sooner. Performed at [facility], [place], [place] [address]    Ct Angio Chest Pe W And/or Wo Contrast  Result Date: 11/25/2018 CLINICAL DATA:  Chest pain and shortness of breath, sore throat EXAM: CT ANGIOGRAPHY CHEST WITH CONTRAST TECHNIQUE: Multidetector CT imaging of the chest was performed using the standard protocol during bolus administration of intravenous contrast. Multiplanar CT image reconstructions and MIPs were obtained to evaluate the vascular anatomy. CONTRAST:  54mL OMNIPAQUE IOHEXOL 350 MG/ML SOLN COMPARISON:  None. FINDINGS: Cardiovascular: Mild four-chamber cardiac enlargement. No significant pericardial effusion. Satisfactory opacification of pulmonary arteries noted, and there is no evidence of pulmonary emboli. Incomplete opacification of the thoracic aorta without any suggestion of dissection or aneurysm. 3 vessel brachiocephalic arterial origin anatomy. Scattered calcified plaque in the arch and descending thoracic segment. Visualized proximal abdominal aorta unremarkable. Mediastinum/Nodes: No hilar or mediastinal adenopathy. Lungs/Pleura: No significant pleural effusion. No pneumothorax. Septal  thickening peripherally particularly in the lung bases with somewhat geographic ground-glass opacities with a predominantly perihilar distribution. There is a more focal region of pleural based airspace consolidation in the right upper lobe image 16/6. Upper Abdomen: No acute findings. Musculoskeletal: Minimal spondylitic changes scattered throughout the mid and lower thoracic spine. No fracture or worrisome bone lesion. Review of the MIP images confirms the above findings. IMPRESSION: 1. Negative for acute PE or thoracic aortic dissection. 2. Cardiomegaly with geographic ground-glass opacities and septal thickening suggesting interstitial edema. 3. Focal airspace opacity in the right upper lobe may represent superimposed infectious process versus atypical edema. Consider follow-up to confirm appropriate resolution. Electronically Signed   By: Lucrezia Europe M.D.   On: 11/25/2018 21:20   Dg Chest Port 1 View  Result Date: 11/25/2018 CLINICAL DATA:  Chest pain, shortness of breath. EXAM: PORTABLE CHEST 1 VIEW COMPARISON:  Radiographs of May 09, 2011. FINDINGS: Stable cardiomegaly. No pneumothorax or pleural effusion is noted. Both lungs are clear. The visualized skeletal structures are unremarkable. IMPRESSION: No active disease. Electronically Signed   By: Marijo Conception M.D.   On: 11/25/2018 19:22    Pending Labs Unresulted Labs (From admission, onward)    Start     Ordered   11/25/18 1912  Lactic acid, plasma  Now then every 2 hours,   STAT     11/25/18 1912   11/25/18 1912  Comprehensive metabolic panel  ONCE - STAT,   STAT     11/25/18 1912   11/25/18 1912  CBC WITH DIFFERENTIAL  ONCE - STAT,   STAT     11/25/18 1912   11/25/18 1912  Blood Culture (routine x 2)  BLOOD CULTURE X 2,   STAT     11/25/18 1912   11/25/18 1912  Urinalysis, Routine w reflex microscopic  ONCE - STAT,   STAT     11/25/18 1912   11/25/18 1912  Procalcitonin  ONCE - STAT,   STAT     11/25/18 1912   11/25/18 1912   Urinalysis, Complete w Microscopic  ONCE - STAT,   STAT     11/25/18 1912          Vitals/Pain Today's Vitals   11/25/18 2030 11/25/18 2043 11/25/18 2045 11/25/18 2124  BP: (!) 130/91  103/82   Pulse: (!) 157     Resp: (!) 28  (!) 40   Temp:  TempSrc:      SpO2: 100%     Weight:      Height:      PainSc:  8   4     Isolation Precautions Droplet and Contact precautions  Medications Medications  sodium chloride flush (NS) 0.9 % injection 3 mL (3 mLs Intravenous Not Given 11/25/18 1901)  cefTRIAXone (ROCEPHIN) 2 g in sodium chloride 0.9 % 100 mL IVPB (0 g Intravenous Stopped 11/25/18 2039)  azithromycin (ZITHROMAX) 500 mg in sodium chloride 0.9 % 250 mL IVPB (0 mg Intravenous Stopped 11/25/18 2154)  diltiazem (CARDIZEM) 100 mg in dextrose 5 % 100 mL (1 mg/mL) infusion (15 mg/hr Intravenous Rate/Dose Change 11/25/18 2124)  fentaNYL (SUBLIMAZE) injection 50 mcg (50 mcg Intravenous Given 11/25/18 2052)  sodium chloride 0.9 % bolus 1,000 mL (0 mLs Intravenous Stopped 11/25/18 2124)  magnesium sulfate 2 GM/50ML IVPB (  Stopped 11/25/18 2039)  diltiazem (CARDIZEM) injection 10 mg (10 mg Intravenous Given 11/25/18 1942)  acetaminophen (TYLENOL) tablet 1,000 mg (1,000 mg Oral Given 11/25/18 2010)  sodium chloride 0.9 % bolus 500 mL (500 mLs Intravenous New Bag/Given 11/25/18 2207)  iohexol (OMNIPAQUE) 350 MG/ML injection 75 mL (75 mLs Intravenous Contrast Given 11/25/18 2100)    Mobility walks Low fall risk   Focused Assessments Cardiac Assessment Handoff:  Cardiac Rhythm: Atrial fibrillation Lab Results  Component Value Date   TROPONINI <0.03 11/25/2018   No results found for: DDIMER Does the Patient currently have chest pain? Yes     R Recommendations: See Admitting Provider Note  Report given to:   Additional Notes:

## 2018-11-25 NOTE — ED Triage Notes (Signed)
Chest pain and SOB since yesterday. Sore throat.

## 2018-11-25 NOTE — ED Notes (Signed)
Patient transported to CT 

## 2018-11-25 NOTE — Telephone Encounter (Signed)
Pt reports SOB with minimal exertion, onset yesterday. States "Hurts in my neck and jaw when I take a deep breath." Unable to lie flat at night. States wheezing at times. "Having to breath really hard." Also reports "arms feel heavy at times" and increased fatigue. States "Feels like something is stuck in my throat." Speech clear during call, denies dysphagia. No travel, no known exposure.  Pt directed to UC/ED.States she will go to UC. Aware if she goes to UC, may be directed to ED.Verbalizes understanding.  States husband will drive.   Reason for Disposition . [1] MILD difficulty breathing (e.g., minimal/no SOB at rest, SOB with walking, pulse <100) AND [2] NEW-onset or WORSE than normal  Answer Assessment - Initial Assessment Questions 1. RESPIRATORY STATUS: "Describe your breathing?" (e.g., wheezing, shortness of breath, unable to speak, severe coughing)      SOB with minimal exertion 2. ONSET: "When did this breathing problem begin?"      yesterday 3. PATTERN "Does the difficult breathing come and go, or has it been constant since it started?"      With exertion, stop and rest, can't lie flat at night 4. SEVERITY: "How bad is your breathing?" (e.g., mild, moderate, severe)    - MILD: No SOB at rest, mild SOB with walking, speaks normally in sentences, can lay down, no retractions, pulse < 100.    - MODERATE: SOB at rest, SOB with minimal exertion and prefers to sit, cannot lie down flat, speaks in phrases, mild retractions, audible wheezing, pulse 100-120.    - SEVERE: Very SOB at rest, speaks in single words, struggling to breathe, sitting hunched forward, retractions, pulse > 120      moderate 5. RECURRENT SYMPTOM: "Have you had difficulty breathing before?" If so, ask: "When was the last time?" and "What happened that time?"      no 6. CARDIAC HISTORY: "Do you have any history of heart disease?" (e.g., heart attack, angina, bypass surgery, angioplasty)      no 7. LUNG HISTORY: "Do you  have any history of lung disease?"  (e.g., pulmonary embolus, asthma, emphysema)     no 8. CAUSE: "What do you think is causing the breathing problem?"      no 9. OTHER SYMPTOMS: "Do you have any other symptoms? (e.g., dizziness, runny nose, cough, chest pain, fever)     Throat sore when deep breathing, mild cough 11. TRAVEL: "Have you traveled out of the country in the last month?" (e.g., travel history, exposures) no  Protocols used: BREATHING DIFFICULTY-A-AH

## 2018-11-25 NOTE — ED Provider Notes (Signed)
[facility] Emergency Department Provider Note    First MD Initiated Contact with Patient 11/25/18 1903     (approximate)  I have reviewed the triage vital signs and the nursing notes.   HISTORY  Chief Complaint Chest Pain and Shortness of Breath    HPI Melissa Bonilla is a 73 y.o. female presents the ER for worsening shortness of breath and chest discomfort with fevers and chills over the past 24 hours.  States that she has been feeling palpitations but states that anytime she takes a deep breath she has burning in her chest.  Is not on any anti-biotics.  Denies any heart disease or lung issues.  No recent known sick contacts.  Has not been tested for COVID-19.    Past Medical History:  Diagnosis Date  . Menopausal syndrome (hot flashes) 1992   used HRT fo 2 yrs    Family History  Problem Relation Age of Onset  . Arthritis Mother        Rheumatoid  . Heart disease Mother        Valvular  . Hypertension Mother   . Arthritis Maternal Grandmother        Rheumatoid  . Hypertension Maternal Grandmother   . Stroke Maternal Grandmother   . Breast cancer Paternal Aunt 73  . Breast cancer Paternal Aunt 110   Past Surgical History:  Procedure Laterality Date  . APPENDECTOMY  1972  . [place]   Patient Active Problem List   Diagnosis Date Noted  . PNA (pneumonia) 11/25/2018  . Obesity 09/02/2015  . Hyperlipidemia with target LDL less than 100 01/13/2014  . Bursitis of hip 01/13/2014  . Hypovitaminosis D 01/13/2014  . Generalized anxiety disorder 05/26/2013  . Lumbago 08/18/2012  . Routine general medical examination at a health care facility 08/18/2012  . Abnormal mammogram of left breast 05/08/2011  . Screening for cervical cancer 05/08/2011  . Screening for colon cancer 05/08/2011      Prior to Admission medications   Medication Sig Start Date End Date Taking? Authorizing Provider  atorvastatin (LIPITOR) 20 MG  tablet Take 1 tablet (20 mg total) by mouth daily. 09/06/15   Crecencio Mc, MD  Cholecalciferol (VITAMIN D3) 1000 units CAPS Take 1,000 Units by mouth daily.    [provider]  escitalopram (LEXAPRO) 20 MG tablet TAKE 1 TABLET EVERY DAY (NEED MD APPOINTMENT) 07/07/16   Crecencio Mc, MD  gabapentin (NEURONTIN) 300 MG capsule Take 1 capsule (300 mg total) by mouth 3 (three) times daily. Patient not taking: Reported on 09/02/2015 01/12/14   Crecencio Mc, MD  oxybutynin (DITROPAN-XL) 5 MG 24 hr tablet Take 1 tablet (5 mg total) by mouth daily. 01/12/14   Crecencio Mc, MD  Tdap Durwin Reges) 5-2.5-18.5 LF-MCG/0.5 injection Inject 0.5 mLs into the muscle once. Patient not taking: Reported on 09/02/2015 03/05/15   Crecencio Mc, MD    Allergies Codeine; Morphine; and Sulfa antibiotics    Social History Social History   Tobacco Use  . Smoking status: Former Smoker    Last attempt to quit: 05/08/1991    Years since quitting: 27.5  . Smokeless tobacco: Never Used  Substance Use Topics  . Alcohol use: Yes  . Drug use: No    Review of Systems Patient denies headaches, rhinorrhea, blurry vision, numbness, shortness of breath, chest pain, edema, cough, abdominal pain, nausea, vomiting, diarrhea, dysuria, fevers, rashes or hallucinations unless otherwise  stated above in HPI. ____________________________________________   PHYSICAL EXAM:  VITAL SIGNS: Vitals:   11/25/18 2300 11/25/18 2315  BP: 90/66 97/74  Pulse: 65 (!) 142  Resp: (!) 23 (!) 37  Temp:    SpO2: 96% 96%    Constitutional: Alert and oriented.  Eyes: Conjunctivae are normal.  Head: Atraumatic. Nose: No congestion/rhinnorhea. Mouth/Throat: Mucous membranes are moist.   Neck: No stridor. Painless ROM.  Cardiovascular: tachycardic irregularly irregular rhythm. Grossly normal heart sounds.  Good peripheral circulation. Respiratory: Normal respiratory effort.  No retractions. Lungs with crackles in posterior  lung fields Gastrointestinal: Soft and nontender. No distention. No abdominal bruits. No CVA tenderness. Genitourinary: defered Musculoskeletal: No lower extremity tenderness nor edema.  No joint effusions. Neurologic:  Normal speech and language. No gross focal neurologic deficits are appreciated. No facial droop Skin:  Skin is warm, dry and intact. No rash noted. Psychiatric: Mood and affect are normal. Speech and behavior are normal.  ____________________________________________   LABS (all labs ordered are listed, but only abnormal results are displayed)  Results for orders placed or performed during the hospital encounter of 11/25/18 (from the past 24 hour(s))  Basic metabolic panel     Status: Abnormal   Collection Time: 11/25/18  6:39 PM  Result Value Ref Range   Sodium 136 135 - 145 mmol/L   Potassium 4.2 3.5 - 5.1 mmol/L   Chloride 104 98 - 111 mmol/L   CO2 24 22 - 32 mmol/L   Glucose, Bld 126 (H) 70 - 99 mg/dL   BUN 11 8 - 23 mg/dL   Creatinine, Ser 0.83 0.44 - 1.00 mg/dL   Calcium 8.9 8.9 - 10.3 mg/dL   GFR calc non Af Amer >60 >60 mL/min   GFR calc Af Amer >60 >60 mL/min   Anion gap 8 5 - 15  CBC     Status: Abnormal   Collection Time: 11/25/18  6:39 PM  Result Value Ref Range   WBC 12.3 (H) 4.0 - 10.5 K/uL   RBC 4.74 3.87 - 5.11 MIL/uL   Hemoglobin 14.5 12.0 - 15.0 g/dL   HCT 43.1 36.0 - 46.0 %   MCV 90.9 80.0 - 100.0 fL   MCH 30.6 26.0 - 34.0 pg   MCHC 33.6 30.0 - 36.0 g/dL   RDW 12.4 11.5 - 15.5 %   Platelets 176 150 - 400 K/uL   nRBC 0.0 0.0 - 0.2 %  Troponin I - ONCE - STAT     Status: None   Collection Time: 11/25/18  6:39 PM  Result Value Ref Range   Troponin I <0.03 <0.03 ng/mL  Lactic acid, plasma     Status: None   Collection Time: 11/25/18  7:12 PM  Result Value Ref Range   Lactic Acid, Venous 1.0 0.5 - 1.9 mmol/L  SARS Coronavirus 2 (CEPHEID- Performed in [place] hospital lab), Hosp Order     Status: None   Collection Time: 11/25/18  7:28  PM  Result Value Ref Range   SARS Coronavirus 2 NEGATIVE NEGATIVE   ____________________________________________  EKG My review and personal interpretation at Time: 18:38   Indication: sepsis  Rate: 165  Rhythm: afib with rvr Axis: normal Other: afib with rvr, nonspecific st abn ____________________________________________  RADIOLOGY  I personally reviewed all radiographic images ordered to evaluate for the above acute complaints and reviewed radiology reports and findings.  These findings were personally discussed with the patient.  Please see medical record for radiology report.  ____________________________________________  PROCEDURES  Procedure(s) performed:  .Critical Care Performed by: Merlyn Lot, MD Authorized by: Merlyn Lot, MD   Critical care provider statement:    Critical care time (minutes):  30   Critical care time was exclusive of:  Separately billable procedures and treating other patients   Critical care was necessary to treat or prevent imminent or life-threatening deterioration of the following conditions:  Cardiac failure   Critical care was time spent personally by me on the following activities:  Development of treatment plan with patient or surrogate, discussions with consultants, evaluation of patient's response to treatment, examination of patient, obtaining history from patient or surrogate, ordering and performing treatments and interventions, ordering and review of laboratory studies, ordering and review of radiographic studies, pulse oximetry, re-evaluation of patient's condition and review of old charts      Critical Care performed: yes  ____________________________________________   INITIAL IMPRESSION / [place] / ED COURSE  Pertinent labs & imaging results that were available during my care of the patient were reviewed by me and considered in my medical decision making (see chart for details).   DDX: Asthma, copd,  CHF, pna, ptx, malignancy, Pe, anemia   Melissa Bonilla is a 73 y.o. who presents to the ED with symptoms as described above.  Patient protecting her airway but tachycardic with evidence of A. fib with RVR.  May be secondary to Sirs criteria given her fever likely secondary to pneumonia.  EKG has some nonspecific changes likely rate dependent.  Will give IV fluid sent for blood differential as well as start patient on diltiazem drip  Clinical Course as of Nov 25 2319  Mon Nov 25, 2018  2051 Patient with persistent tachycardia though she is on diltiazem drip.  There is some improvement in rate but still in the 140s.  Still complaining of some chest discomfort shortness of breath and pain being going into her jaw.  Initial troponin negative.  Presentation somewhat complicated by her sensation concerning for sepsis.  PE also on the differential will order CT angiogram.   [PR]  2126 Patient reassessed and feels much improved.  Still tachycardic but heart rate is improving on diltiazem.  Coronavirus is negative.  Initial troponin is negative.  Likely sec symptomatic from A. fib with RVR.  Patient will require hospitalization for further medical management.   [PR]    Clinical Course User Index [PR] Merlyn Lot, MD   The patient was evaluated in Emergency Department today for the symptoms described in the history of present illness. He/she was evaluated in the context of the global COVID-19 pandemic, which necessitated consideration that the patient might be at risk for infection with the SARS-CoV-2 virus that causes COVID-19. Institutional protocols and algorithms that pertain to the evaluation of patients at risk for COVID-19 are in a state of rapid change based on information released by regulatory bodies including the CDC and federal and state organizations. These policies and algorithms were followed during the patient's care in the ED.   As part of my medical decision making, I reviewed the  following data within the [place] notes reviewed and incorporated, Labs reviewed, notes from prior ED visits and [place] Controlled Substance Database   ____________________________________________   FINAL CLINICAL IMPRESSION(S) / ED DIAGNOSES  Final diagnoses:  Atrial fibrillation with RVR (HCC)  Dyspnea, unspecified type      NEW MEDICATIONS STARTED DURING THIS VISIT:  New Prescriptions   No medications on file  Note:  This document was prepared using Dragon voice recognition software and may include unintentional dictation errors.    Merlyn Lot, MD 11/25/18 [phone]

## 2018-11-25 NOTE — Telephone Encounter (Signed)
Going to go ED not seen in office since 2017

## 2018-11-26 ENCOUNTER — Inpatient Hospital Stay (HOSPITAL_COMMUNITY)
Admit: 2018-11-26 | Discharge: 2018-11-26 | Disposition: A | Discharge status: Home / Self Care | Payer: Medicare HMO | Attending: Nurse Practitioner | Admitting: Nurse Practitioner

## 2018-11-26 ENCOUNTER — Encounter: Payer: Self-pay | Admitting: Internal Medicine

## 2018-11-26 DIAGNOSIS — R0789 Other chest pain: Secondary | ICD-10-CM

## 2018-11-26 DIAGNOSIS — I361 Nonrheumatic tricuspid (valve) insufficiency: Secondary | ICD-10-CM

## 2018-11-26 DIAGNOSIS — J189 Pneumonia, unspecified organism: Principal | ICD-10-CM

## 2018-11-26 DIAGNOSIS — I340 Nonrheumatic mitral (valve) insufficiency: Secondary | ICD-10-CM

## 2018-11-26 DIAGNOSIS — I4891 Unspecified atrial fibrillation: Secondary | ICD-10-CM

## 2018-11-26 LAB — CBC
HCT: 38.5 % (ref 36.0–46.0)
Hemoglobin: 12.8 g/dL (ref 12.0–15.0)
MCH: 30.8 pg (ref 26.0–34.0)
MCHC: 33.2 g/dL (ref 30.0–36.0)
MCV: 92.8 fL (ref 80.0–100.0)
Platelets: 154 10*3/uL (ref 150–400)
RBC: 4.15 MIL/uL (ref 3.87–5.11)
RDW: 12.7 % (ref 11.5–15.5)
WBC: 9.4 10*3/uL (ref 4.0–10.5)
nRBC: 0 % (ref 0.0–0.2)

## 2018-11-26 LAB — CBC WITH DIFFERENTIAL/PLATELET
Abs Immature Granulocytes: 0.03 10*3/uL (ref 0.00–0.07)
Basophils Absolute: 0 10*3/uL (ref 0.0–0.1)
Basophils Relative: 0 %
Eosinophils Absolute: 0.3 10*3/uL (ref 0.0–0.5)
Eosinophils Relative: 2 %
HCT: 39.9 % (ref 36.0–46.0)
Hemoglobin: 13.5 g/dL (ref 12.0–15.0)
Immature Granulocytes: 0 %
Lymphocytes Relative: 14 %
Lymphs Abs: 1.7 10*3/uL (ref 0.7–4.0)
MCH: 31 pg (ref 26.0–34.0)
MCHC: 33.8 g/dL (ref 30.0–36.0)
MCV: 91.5 fL (ref 80.0–100.0)
Monocytes Absolute: 1.3 10*3/uL — ABNORMAL HIGH (ref 0.1–1.0)
Monocytes Relative: 11 %
Neutro Abs: 8.7 10*3/uL — ABNORMAL HIGH (ref 1.7–7.7)
Neutrophils Relative %: 73 %
Platelets: 160 10*3/uL (ref 150–400)
RBC: 4.36 MIL/uL (ref 3.87–5.11)
RDW: 12.6 % (ref 11.5–15.5)
WBC: 12.1 10*3/uL — ABNORMAL HIGH (ref 4.0–10.5)
nRBC: 0 % (ref 0.0–0.2)

## 2018-11-26 LAB — COMPREHENSIVE METABOLIC PANEL
ALT: 59 U/L — ABNORMAL HIGH (ref 0–44)
AST: 38 U/L (ref 15–41)
Albumin: 3.5 g/dL (ref 3.5–5.0)
Alkaline Phosphatase: 80 U/L (ref 38–126)
Anion gap: 7 (ref 5–15)
BUN: 10 mg/dL (ref 8–23)
CO2: 20 mmol/L — ABNORMAL LOW (ref 22–32)
Calcium: 7.9 mg/dL — ABNORMAL LOW (ref 8.9–10.3)
Chloride: 110 mmol/L (ref 98–111)
Creatinine, Ser: 0.66 mg/dL (ref 0.44–1.00)
GFR calc Af Amer: >60 mL/min (ref >60)
GFR calc non Af Amer: >60 mL/min (ref >60)
Glucose, Bld: 137 mg/dL — ABNORMAL HIGH (ref 70–99)
Potassium: 4.4 mmol/L (ref 3.5–5.1)
Sodium: 137 mmol/L (ref 135–145)
Total Bilirubin: 1.2 mg/dL (ref 0.3–1.2)
Total Protein: 6.1 g/dL — ABNORMAL LOW (ref 6.5–8.1)

## 2018-11-26 LAB — LIPID PANEL
Cholesterol: 145 mg/dL (ref 0–200)
HDL: 39 mg/dL — ABNORMAL LOW (ref >40)
LDL Cholesterol: 95 mg/dL (ref 0–99)
Total CHOL/HDL Ratio: 3.7 RATIO
Triglycerides: 54 mg/dL (ref <150)
VLDL: 11 mg/dL (ref 0–40)

## 2018-11-26 LAB — TSH: TSH: 1.224 u[IU]/mL (ref 0.350–4.500)

## 2018-11-26 LAB — ECHOCARDIOGRAM COMPLETE
Height: 60 in
Weight: 2523.2 oz

## 2018-11-26 LAB — BASIC METABOLIC PANEL
Anion gap: 9 (ref 5–15)
BUN: 11 mg/dL (ref 8–23)
CO2: 19 mmol/L — ABNORMAL LOW (ref 22–32)
Calcium: 8.3 mg/dL — ABNORMAL LOW (ref 8.9–10.3)
Chloride: 108 mmol/L (ref 98–111)
Creatinine, Ser: 0.7 mg/dL (ref 0.44–1.00)
GFR calc Af Amer: >60 mL/min (ref >60)
GFR calc non Af Amer: >60 mL/min (ref >60)
Glucose, Bld: 122 mg/dL — ABNORMAL HIGH (ref 70–99)
Potassium: 4.1 mmol/L (ref 3.5–5.1)
Sodium: 136 mmol/L (ref 135–145)

## 2018-11-26 LAB — LACTIC ACID, PLASMA: Lactic Acid, Venous: 1.3 mmol/L (ref 0.5–1.9)

## 2018-11-26 LAB — HEPARIN LEVEL (UNFRACTIONATED): Heparin Unfractionated: 0.51 IU/mL (ref 0.30–0.70)

## 2018-11-26 LAB — PROCALCITONIN: Procalcitonin: <0.1 ng/mL

## 2018-11-26 LAB — TROPONIN I: Troponin I: <0.03 ng/mL (ref <0.03)

## 2018-11-26 LAB — PROTIME-INR
INR: 1.3 — ABNORMAL HIGH (ref 0.8–1.2)
Prothrombin Time: 16.1 seconds — ABNORMAL HIGH (ref 11.4–15.2)

## 2018-11-26 LAB — APTT: aPTT: 37 seconds — ABNORMAL HIGH (ref 24–36)

## 2018-11-26 MED ORDER — HEPARIN (PORCINE) 25000 UT/250ML-% IV SOLN
800.0000 [IU]/h | INTRAVENOUS | Status: AC
  Administered 2018-11-26: 800 [IU]/h via INTRAVENOUS
  Filled 2018-11-26: qty 250

## 2018-11-26 MED ORDER — SODIUM CHLORIDE 0.9 % IV SOLN
250.0000 mL | INTRAVENOUS | Status: DC | PRN
  Administered 2018-11-27: 250 mL via INTRAVENOUS
  Administered 2018-11-28: 10:00:00 via INTRAVENOUS

## 2018-11-26 MED ORDER — METOPROLOL TARTRATE 25 MG PO TABS
25.0000 mg | ORAL_TABLET | Freq: Two times a day (BID) | ORAL | Status: DC
  Administered 2018-11-26 (×2): 25 mg via ORAL
  Filled 2018-11-26 (×2): qty 1

## 2018-11-26 MED ORDER — IPRATROPIUM-ALBUTEROL 0.5-2.5 (3) MG/3ML IN SOLN: 3.0000 mL | Freq: Four times a day (QID) | RESPIRATORY_TRACT | Status: DC | PRN

## 2018-11-26 MED ORDER — OXYCODONE-ACETAMINOPHEN 5-325 MG PO TABS
1.0000 | ORAL_TABLET | Freq: Three times a day (TID) | ORAL | Status: DC | PRN
  Administered 2018-11-26: 1 via ORAL
  Filled 2018-11-26: qty 1

## 2018-11-26 MED ORDER — SODIUM CHLORIDE 0.9 % IV SOLN: 500.0000 mg | INTRAVENOUS | Status: DC

## 2018-11-26 MED ORDER — PHENOL 1.4 % MT LIQD
1.0000 | OROMUCOSAL | Status: DC | PRN
  Filled 2018-11-26: qty 177

## 2018-11-26 MED ORDER — DILTIAZEM HCL-DEXTROSE 100-5 MG/100ML-% IV SOLN (PREMIX)
5.0000 mg/h | INTRAVENOUS | Status: DC
  Administered 2018-11-26: 12.5 mg/h via INTRAVENOUS
  Filled 2018-11-26 (×4): qty 100

## 2018-11-26 MED ORDER — SODIUM CHLORIDE 0.9 % IV SOLN
1.0000 g | INTRAVENOUS | Status: DC
  Administered 2018-11-27: 1 g via INTRAVENOUS
  Filled 2018-11-26: qty 10
  Filled 2018-11-26: qty 1

## 2018-11-26 MED ORDER — ONDANSETRON HCL 4 MG/2ML IJ SOLN
4.0000 mg | Freq: Four times a day (QID) | INTRAMUSCULAR | Status: DC | PRN
  Administered 2018-11-27: 4 mg via INTRAVENOUS
  Filled 2018-11-26: qty 2

## 2018-11-26 MED ORDER — ENOXAPARIN SODIUM 80 MG/0.8ML ~~LOC~~ SOLN
1.0000 mg/kg | Freq: Two times a day (BID) | SUBCUTANEOUS | Status: DC
  Administered 2018-11-26: 70 mg via SUBCUTANEOUS
  Filled 2018-11-26: qty 0.8

## 2018-11-26 MED ORDER — ACETAMINOPHEN 325 MG PO TABS: 650.0000 mg | ORAL_TABLET | ORAL | Status: DC | PRN

## 2018-11-26 MED ORDER — SODIUM CHLORIDE 0.9% FLUSH: 3.0000 mL | INTRAVENOUS | Status: DC | PRN

## 2018-11-26 MED ORDER — SODIUM CHLORIDE 0.9% FLUSH
3.0000 mL | Freq: Two times a day (BID) | INTRAVENOUS | Status: DC
  Administered 2018-11-26 – 2018-11-30 (×9): 3 mL via INTRAVENOUS

## 2018-11-26 MED ORDER — FUROSEMIDE 10 MG/ML IJ SOLN
20.0000 mg | Freq: Two times a day (BID) | INTRAMUSCULAR | Status: DC
  Administered 2018-11-26 – 2018-11-30 (×7): 20 mg via INTRAVENOUS
  Filled 2018-11-26 (×5): qty 2
  Filled 2018-11-26: qty 4
  Filled 2018-11-26: qty 2

## 2018-11-26 NOTE — Consult Note (Addendum)
Cardiology Consultation:   Patient ID: Melissa Bonilla MRN: [id number]; DOB: [birth year]  Admit date: 11/25/2018 Date of Consult: 11/26/2018  Primary Care Provider: Crecencio Mc, MD Primary Cardiologist: New - Melissa Bonilla Primary Electrophysiologist:  None    Patient Profile:   Melissa Bonilla is a 73 y.o. female with a hx of hyperlipidemia, depression, and remote tobacco use,  who is being seen today for the evaluation of atrial fibrillation and chest pain at the request of Dr. Benjie Karvonen.  History of Present Illness:   Melissa Bonilla reports developing upper chest discomfort radiating to the neck and jaw 2 days ago.  She describes the pain as burning and is worsened with deep inspiration.  She denies fevers, chills, and cough but was noted to be febrile in the ED.  She has also experienced shortness of breath since the onset of chest pain 2 days ago.  She denies orthopnea but endorses PND.  In the ED, she was noted to be in atrial fibrillation with rapid ventricular response, with improvement in heart rate following initiation of diltiazem infusion.  Chest CT showed bilateral ground glass opacities as well as consolidation in the right upper lobe, concerning for PNA.  COVID-19 test was negative.  At this time, Melissa Bonilla reports some improvement in her chest pain and shortness of breath.  Palpitations have resolved.  She denies a history of prior cardiac disease.  She reports having undergone a stress test ~20 years ago around the time that she was diagnosed with hyperlipidemia.  She believes the stress test was normal.  She takes OTC vitamins/supplements but no medications at this time.  She was prescribed atorvastatin in the past but self-discontinued this due to myalgias.  Past Medical History:  Diagnosis Date   Depression    Hyperlipidemia    Menopausal syndrome (hot flashes) 1992   used HRT fo 2 yrs     Past Surgical History:  Procedure Laterality Date   [place] medications: None  Inpatient Medications: Scheduled Meds:  enoxaparin (LOVENOX) injection  1 mg/kg Subcutaneous Q12H   sodium chloride flush  3 mL Intravenous Once   sodium chloride flush  3 mL Intravenous Q12H   Continuous Infusions:  sodium chloride     azithromycin Stopped (11/25/18 2154)   cefTRIAXone (ROCEPHIN)  IV     diltiazem (CARDIZEM) infusion 10 mg/hr (11/26/18 0617)   PRN Meds: sodium chloride, acetaminophen, ipratropium-albuterol, ondansetron (ZOFRAN) IV, oxyCODONE-acetaminophen, sodium chloride flush  Allergies:    Allergies  Allergen Reactions   Codeine Nausea Only and Rash   Morphine Nausea And Vomiting    Pt allergy may be codeine as codeine or morphine were combined on previous health history. Pt reported upset stomach on questionnaire.   Sulfa Antibiotics Nausea And Vomiting    Social History:   Social History   Tobacco Use   Smoking status: Former Smoker    Packs/day: 0.25    Years: 15.00    Pack years: 3.75    Types: Cigarettes    Last attempt to quit: 1992    Years since quitting: 28.4   Smokeless tobacco: Never Used  Substance Use Topics   Alcohol use: Yes   Drug use: No    Family History:   Family History  Problem Relation Age of Onset   Arthritis Mother        Rheumatoid   Heart disease Mother  Valvular   Hypertension Mother    Heart disease Father 9       CABG   Arthritis Maternal Grandmother        Rheumatoid   Hypertension Maternal Grandmother    Stroke Maternal Grandmother    Breast cancer Paternal Aunt 13   Breast cancer Paternal Aunt 59     ROS:  Please see the history of present illness.  All other ROS reviewed and negative.     Physical Exam/Data:   Vitals:   11/26/18 0700 11/26/18 0730 11/26/18 0753 11/26/18 0756  BP: 93/63 107/71 105/67   Pulse: 91 94 (!) 56   Resp:   16   Temp:   97.6 F (36.4 C)   TempSrc:   Oral   SpO2:   93%   Weight:        Height:    5' (1.524 m)    Intake/Output Summary (Last 24 hours) at 11/26/2018 0929 Last data filed at 11/26/2018 0406 Gross per 24 hour  Intake 250 ml  Output 400 ml  Net -150 ml   Last 3 Weights 11/25/2018 11/25/2018 09/02/2015  Weight (lbs) 157 lb 11.2 oz 151 lb 161 lb  Weight (kg) 71.532 kg 68.493 kg 73.029 kg     Body mass index is 30.8 kg/m.  General:  Well nourished, well developed, in no acute distress HEENT: normal Lymph: no adenopathy Neck: no JVD Endocrine:  No thryomegaly Vascular: No carotid bruits; radial and pedal pulses are 2+  Cardiac:  Irregularly irregular with 1/6 systolic murmur. Lungs:  Mildly diminished breath sounds throughout with bibasilar crackles.  No wheezes Abd: soft, nontender, no hepatomegaly  Ext: Trace pretibial edema bilaterally. Musculoskeletal:  No deformities, BUE and BLE strength normal and equal Skin: warm and dry  Neuro:  CNs 2-12 intact, no focal abnormalities noted Psych:  Normal affect   EKG:  The EKG was personally reviewed and demonstrates:  A-fib with RVR and inferolateral ST depression and T wave inversions. Telemetry:  Telemetry was personally reviewed and demonstrates:  Atrial fibrillation with HR 70-140 bpm.  Relevant CV Studies: None  Laboratory Data:  Chemistry Recent Labs  Lab 11/25/18 1839 11/26/18 0003 11/26/18 0430  NA 136 137 136  K 4.2 4.4 4.1  CL 104 110 108  CO2 24 20* 19*  GLUCOSE 126* 137* 122*  BUN 11 10 11   CREATININE 0.83 0.66 0.70  CALCIUM 8.9 7.9* 8.3*  GFRNONAA >60 >60 >60  GFRAA >60 >60 >60  ANIONGAP 8 7 9     Recent Labs  Lab 11/26/18 0003  PROT 6.1*  ALBUMIN 3.5  AST 38  ALT 59*  ALKPHOS 80  BILITOT 1.2   Hematology Recent Labs  Lab 11/25/18 1839 11/26/18 0003 11/26/18 0430  WBC 12.3* 12.1* 9.4  RBC 4.74 4.36 4.15  HGB 14.5 13.5 12.8  HCT 43.1 39.9 38.5  MCV 90.9 91.5 92.8  MCH 30.6 31.0 30.8  MCHC 33.6 33.8 33.2  RDW 12.4 12.6 12.7  PLT 176 160 154   Cardiac  Enzymes Recent Labs  Lab 11/25/18 1839  TROPONINI <0.03   No results for input(s): TROPIPOC in the last 168 hours.  BNPNo results for input(s): BNP, PROBNP in the last 168 hours.  DDimer No results for input(s): DDIMER in the last 168 hours.  Radiology/Studies:  Ct Angio Chest Pe W And/or Wo Contrast  Result Date: 11/25/2018 CLINICAL DATA:  Chest pain and shortness of breath, sore throat EXAM: CT ANGIOGRAPHY CHEST WITH CONTRAST TECHNIQUE:  Multidetector CT imaging of the chest was performed using the standard protocol during bolus administration of intravenous contrast. Multiplanar CT image reconstructions and MIPs were obtained to evaluate the vascular anatomy. CONTRAST:  64mL OMNIPAQUE IOHEXOL 350 MG/ML SOLN COMPARISON:  None. FINDINGS: Cardiovascular: Mild four-chamber cardiac enlargement. No significant pericardial effusion. Satisfactory opacification of pulmonary arteries noted, and there is no evidence of pulmonary emboli. Incomplete opacification of the thoracic aorta without any suggestion of dissection or aneurysm. 3 vessel brachiocephalic arterial origin anatomy. Scattered calcified plaque in the arch and descending thoracic segment. Visualized proximal abdominal aorta unremarkable. Mediastinum/Nodes: No hilar or mediastinal adenopathy. Lungs/Pleura: No significant pleural effusion. No pneumothorax. Septal thickening peripherally particularly in the lung bases with somewhat geographic ground-glass opacities with a predominantly perihilar distribution. There is a more focal region of pleural based airspace consolidation in the right upper lobe image 16/6. Upper Abdomen: No acute findings. Musculoskeletal: Minimal spondylitic changes scattered throughout the mid and lower thoracic spine. No fracture or worrisome bone lesion. Review of the MIP images confirms the above findings. IMPRESSION: 1. Negative for acute PE or thoracic aortic dissection. 2. Cardiomegaly with geographic ground-glass opacities  and septal thickening suggesting interstitial edema. 3. Focal airspace opacity in the right upper lobe may represent superimposed infectious process versus atypical edema. Consider follow-up to confirm appropriate resolution. Electronically Signed   By: Lucrezia Europe M.D.   On: 11/25/2018 21:20   Dg Chest Port 1 View  Result Date: 11/25/2018 CLINICAL DATA:  Chest pain, shortness of breath. EXAM: PORTABLE CHEST 1 VIEW COMPARISON:  Radiographs of May 09, 2011. FINDINGS: Stable cardiomegaly. No pneumothorax or pleural effusion is noted. Both lungs are clear. The visualized skeletal structures are unremarkable. IMPRESSION: No active disease. Electronically Signed   By: Marijo Conception M.D.   On: 11/25/2018 19:22    Assessment and Plan:   Atrial fibrillation with rapid ventricular response: Duration of a-fib is uncertain and may have been precipitated by pneumonia.  However, I wonder if she has been in a-fib for some time and some of her lung findings are due to heart failure.  Continue diltiazem infusion for heart rate control at this time.  Given CHADSVASC score of at least 2 (age and gender), I recommend anticoagulation.  We will place the patient on a heparin infusion with plans to transition to NOAC prior to discharge once it is clear that no invasive procedures are needed.  Obtain echocardiogram, as ordered.  If HR remains adequately controlled, recommend deferring DCCV until PNA has been treated and the patient adequately anticoagulated for at least 4 weeks.  Chest pain: Most likely due to pneumonia.  TnI negative x 1.  EKG with subtle inferolateral ST/T changes when tachycardic.  Follow-up echo.  Defer ischemia evaluation until PNA has been treated unless echo shows clear regional wall-motion abnormality.  Repeat TnI this AM.  Pneumonia: Patient noted to be febrile with mild leukocytosis on admission.  CT chest findings raise possibility of atypical infection as well.  COVID-19  negative.  Continue antibiotics and further workup per internal medicine.  May benefit from gentle diuresis given concern for possible concurrent heart failure.  Could start with furosemide 20 mg IV BID with titration based on response.   For questions or updates, please contact [place] Please consult [email or web address] for contact info under [facility] Cardiology.  Signed, Nelva Bush, MD  11/26/2018 9:29 AM

## 2018-11-26 NOTE — Progress Notes (Signed)
Initial Nutrition Assessment  RD working remotely.  DOCUMENTATION CODES:   Not applicable  INTERVENTION:  Provide Ensure Enlive po BID, each supplement provides 350 kcal and 20 grams of protein. Patient prefers vanilla.  NUTRITION DIAGNOSIS:   Inadequate oral intake related to decreased appetite as evidenced by per patient/family report.  GOAL:   Patient will meet greater than or equal to 90% of their needs  MONITOR:   PO intake, Supplement acceptance, Labs, Weight trends, I & O's  REASON FOR ASSESSMENT:   Malnutrition Screening Tool    ASSESSMENT:   73 year old female with PMHx of HLD, depression admitted with PNA, new-onset A-fib, mild pulmonary edema.   Spoke with patient over the phone to obtain nutrition/weight history. She reports she has had a decreased appetite/intake for the past 4 days related to PNA/A-fib and generally not feeling well. She has a lack of taste. She is eating about 50% of meals at this time. Patient reports she has a good appetite and intake at baseline. She typically eats 3 meals per day. Breakfast is yogurt with granola. Lunch is a hamburger patty (made at home) with salad. Dinner is usually a sandwich. She is amenable to drinking ONS to help meet calorie/protein needs until appetite returns.  Patient reports she has lost 1-2 lbs recently but reports she is not concerned about that as it is a natural fluctuation for her. She is currently 73 kg (157.7 lbs).   Medications reviewed and include: Lasix 20 mg BID IV, azithromycin, ceftriaxone, heparin gtt.  Labs reviewed.  NUTRITION - FOCUSED PHYSICAL EXAM:  Unable to complete at this time.  Diet Order:   Diet Order            Diet Heart Room service appropriate? Yes; Fluid consistency: Thin  Diet effective now             EDUCATION NEEDS:   No education needs have been identified at this time  Skin:  Skin Assessment: Reviewed RN Assessment  Last BM:  11/25/2018 per chart  Height:    Ht Readings from Last 1 Encounters:  11/26/18 5' (1.524 m)   Weight:   Wt Readings from Last 1 Encounters:  11/25/18 71.5 kg   Ideal Body Weight:  45.5 kg  BMI:  Body mass index is 30.8 kg/m.  Estimated Nutritional Needs:   Kcal:  1500-1700  Protein:  75-85 grams  Fluid:  1.5-1.7 L/day  Willey Blade, MS, RD, LDN Office: [phone] Pager: [phone] After Hours/Weekend Pager: [phone]

## 2018-11-26 NOTE — Progress Notes (Signed)
   Progress Note  Patient Name: Melissa Bonilla Date of Encounter: 11/26/2018  Echocardiogram personally reviewed and notable for moderately reduced LV systolic function (LVEF ~95%) with global hypokinesis, as well as moderate MR and evidence of elevated CVP.  I recommend discontinuation of diltiazem infusion and initiation of metoprolol tartrate 25 mg BID, to be escalated as needed to achieve a resting heart rate of < 110 bpm.  We will continue heparin infusion and consider transitioning to a NOAC tomorrow.  I agree with gentle diuresis.  Signed, Nelva Bush, MD  11/26/2018, 2:20 PM

## 2018-11-26 NOTE — Progress Notes (Signed)
Pt has tolerated 12.5 mg/h well. BP stable but soft. Pt HR has sustained 80-90s so rate titrated to 10 mg/h. Pt has no concerns at this time.

## 2018-11-26 NOTE — Progress Notes (Signed)
*  PRELIMINARY RESULTS* Echocardiogram 2D Echocardiogram has been performed.  Melissa Bonilla 11/26/2018, 2:03 PM

## 2018-11-26 NOTE — Consult Note (Addendum)
[facility] for Heparin  Indication: atrial fibrillation  Allergies  Allergen Reactions  . Codeine Nausea Only and Rash  . Morphine Nausea And Vomiting    Pt allergy may be codeine as codeine or morphine were combined on previous health history. Pt reported upset stomach on questionnaire.  . Sulfa Antibiotics Nausea And Vomiting    Patient Measurements: Height: 5' (152.4 cm) Weight: 157 lb 11.2 oz (71.5 kg) IBW/kg (Calculated) : 45.5 Heparin Dosing Weight: 60.4  Kg   Vital Signs: Temp: 97.6 F (36.4 C) (06/09 0753) Temp Source: Oral (06/09 0753) BP: 105/67 (06/09 0753) Pulse Rate: 56 (06/09 0753)  Labs: Recent Labs    11/25/18 1839 11/26/18 0003 11/26/18 0430  HGB 14.5 13.5 12.8  HCT 43.1 39.9 38.5  PLT 176 160 154  LABPROT  --   --  16.1*  INR  --   --  1.3*  CREATININE 0.83 0.66 0.70  TROPONINI <0.03  --   --     Estimated Creatinine Clearance: 56.1 mL/min (by C-G formula based on SCr of 0.7 mg/dL).   Medications:  enoxaparin (LOVENOX) injection 70 mg Q12H- treatment dose- last given 11/26/2018 @ 0040  Assessment: Pharmacy was consulted for anticoagulant management in a 72 YOF admitted for discomfort, SOB and palpitations. Given patient recently had LMWH, will start heparin gtt 12 hours after last dose of LMWH. Patient does not need a bolus at this time and this was discussed with Dr. Saunders Revel.   BMI 30.80. (BMI >40 lack evidence in the obese patient population)  CHADSVASC score: 2 (age & gender)- ECHO pending   Goal of Therapy:  Heparin level 0.3-0.7 units/ml Monitor platelets by anticoagulation protocol: Yes   Plan:  Start heparin infusion at 800 units/hr @ 1300 Check anti-Xa level in 8 hours (@2100 ) and daily while on heparin Continue to monitor H&H and platelets  Will follow-up on anticoagulant plan.    Rowland Lathe 11/26/2018,9:57 AM

## 2018-11-26 NOTE — H&P (Signed)
[facility] NAME: Melissa Bonilla    MR#:  [id number]  DATE OF BIRTH:  1946-03-03  DATE OF ADMISSION:  11/25/2018  PRIMARY CARE PHYSICIAN: Crecencio Mc, MD   REQUESTING/REFERRING PHYSICIAN: Merlyn Lot, MD  CHIEF COMPLAINT:   Chief Complaint  Patient presents with  . Chest Pain  . Shortness of Breath    HISTORY OF PRESENT ILLNESS:  Melissa Bonilla  is a 73 y.o. female with a known history of depression and hyperlipidemia.  She presented to the emergency room complaining of a 24-hour history of shortness of breath with palpitations as well as fevers and chills.  Patient is also experiencing midsternal chest pain described as burning with a pain score 6-10 out of 10.  Chest pain is made worse with a deep inspiration.  Pain has radiated to patient's bilateral jaw area with associated weakness and nausea.  Patient denies a prior history of chest pain.  She denies a prior history of MI or CVA.  She has no history of DVT or pulmonary embolism.  A denies abdominal pain.  She denies cough.  Patient has no history of tobacco abuse.  On arrival to the emergency room, EKG demonstrated atrial fibrillation with RVR with rate in the 140s.  Initial troponin level is negative.  CTA chest is negative for aortic dissection or pulmonary embolism.  Patient was started on diltiazem infusion in the emergency room which has been continued as well.  Heart rate is improving with rate between 110 and 120.   On arrival WBC is 12.1.  Troponin is less than 0.03.  Lactic acid is 1.3.  X-ray demonstrates a right upper lobe opacity concerning for infection process.  Rapid COVID-19 testing is negative.  She has been admitted to the hospitalist service for further management PAST MEDICAL HISTORY:   Past Medical History:  Diagnosis Date  . Menopausal syndrome (hot flashes) 1992   used HRT fo 2 yrs     PAST SURGICAL HISTORY:   Past Surgical History:   Procedure Laterality Date  . APPENDECTOMY  1972  . [place]    SOCIAL HISTORY:   Social History   Tobacco Use  . Smoking status: Former Smoker    Last attempt to quit: 05/08/1991    Years since quitting: 27.5  . Smokeless tobacco: Never Used  Substance Use Topics  . Alcohol use: Yes    FAMILY HISTORY:   Family History  Problem Relation Age of Onset  . Arthritis Mother        Rheumatoid  . Heart disease Mother        Valvular  . Hypertension Mother   . Arthritis Maternal Grandmother        Rheumatoid  . Hypertension Maternal Grandmother   . Stroke Maternal Grandmother   . Breast cancer Paternal Aunt 79  . Breast cancer Paternal Aunt 29    DRUG ALLERGIES:   Allergies  Allergen Reactions  . Codeine Nausea Only and Rash  . Morphine Nausea And Vomiting    Pt allergy may be codeine as codeine or morphine were combined on previous health history. Pt reported upset stomach on questionnaire.  . Sulfa Antibiotics Nausea And Vomiting    REVIEW OF SYSTEMS:   ROS  Review of Systems  Constitutional: Positive for fever and malaise/fatigue. Negative for chills and diaphoresis.  HENT: Negative for congestion, sinus pain and sore throat.   Eyes: Negative  for blurred vision, double vision and pain.  Respiratory: Positive for shortness of breath. Negative for cough, hemoptysis, sputum production and wheezing.   Cardiovascular: Positive for chest pain and palpitations. Negative for orthopnea, leg swelling and PND.  Gastrointestinal: Positive for nausea. Negative for abdominal pain, blood in stool, constipation, diarrhea, heartburn, melena and vomiting.  Genitourinary: Negative for dysuria, flank pain, frequency, hematuria and urgency.  Musculoskeletal: Negative for falls, joint pain and myalgias.  Skin: Negative.  Negative for itching and rash.  Neurological: Positive for weakness. Negative for dizziness, seizures, loss of consciousness and headaches.   Psychiatric/Behavioral: Negative.     MEDICATIONS AT HOME:   Prior to Admission medications   Medication Sig Start Date End Date Taking? Authorizing Provider  atorvastatin (LIPITOR) 20 MG tablet Take 1 tablet (20 mg total) by mouth daily. 09/06/15   Crecencio Mc, MD  Cholecalciferol (VITAMIN D3) 1000 units CAPS Take 1,000 Units by mouth daily.    [provider]  escitalopram (LEXAPRO) 20 MG tablet TAKE 1 TABLET EVERY DAY (NEED MD APPOINTMENT) 07/07/16   Crecencio Mc, MD  gabapentin (NEURONTIN) 300 MG capsule Take 1 capsule (300 mg total) by mouth 3 (three) times daily. Patient not taking: Reported on 09/02/2015 01/12/14   Crecencio Mc, MD  oxybutynin (DITROPAN-XL) 5 MG 24 hr tablet Take 1 tablet (5 mg total) by mouth daily. 01/12/14   Crecencio Mc, MD  Tdap Durwin Reges) 5-2.5-18.5 LF-MCG/0.5 injection Inject 0.5 mLs into the muscle once. Patient not taking: Reported on 09/02/2015 03/05/15   Crecencio Mc, MD      VITAL SIGNS:  Blood pressure 110/87, pulse (!) 146, temperature 98.3 F (36.8 C), temperature source Oral, resp. rate 20, height 5' (1.524 m), weight 71.5 kg, SpO2 94 %.  PHYSICAL EXAMINATION:  Physical Exam Vitals signs and nursing note reviewed.  Constitutional:      General: She is not in acute distress.    Appearance: She is well-developed and normal weight. She is not ill-appearing or diaphoretic.  HENT:     Head: Normocephalic and atraumatic.  Eyes:     Extraocular Movements: Extraocular movements intact.     Pupils: Pupils are equal, round, and reactive to light.  Neck:     Musculoskeletal: Normal range of motion and neck supple.     Vascular: No JVD.  Cardiovascular:     Rate and Rhythm: Tachycardia present. Rhythm irregular.     Pulses:          Carotid pulses are 2+ on the right side and 2+ on the left side.      Radial pulses are 2+ on the right side and 2+ on the left side.       Dorsalis pedis pulses are 2+ on the right side and 2+ on the  left side.       Posterior tibial pulses are 2+ on the right side and 2+ on the left side.     Heart sounds: No murmur. No friction rub. No gallop.   Pulmonary:     Effort: Pulmonary effort is normal. No tachypnea, accessory muscle usage or respiratory distress.     Breath sounds: Normal breath sounds. No decreased breath sounds, wheezing, rhonchi or rales.  Chest:     Chest wall: No mass, tenderness or edema.  Abdominal:     General: Bowel sounds are normal.     Palpations: Abdomen is soft. There is no mass.     Tenderness: There is no abdominal  tenderness. There is no guarding or rebound.  Musculoskeletal: Normal range of motion.     Right lower leg: She exhibits no tenderness. No edema.     Left lower leg: She exhibits no tenderness. No edema.  Skin:    General: Skin is warm and dry.     Capillary Refill: Capillary refill takes less than 2 seconds.     Findings: No rash.  Neurological:     General: No focal deficit present.     Mental Status: She is alert and oriented to person, place, and time.     Cranial Nerves: No cranial nerve deficit.     Motor: No weakness.      LABORATORY PANEL:   CBC Recent Labs  Lab 11/25/18 1839  WBC 12.3*  HGB 14.5  HCT 43.1  PLT 176   ------------------------------------------------------------------------------------------------------------------  Chemistries  Recent Labs  Lab 11/25/18 1839  NA 136  K 4.2  CL 104  CO2 24  GLUCOSE 126*  BUN 11  CREATININE 0.83  CALCIUM 8.9   ------------------------------------------------------------------------------------------------------------------  Cardiac Enzymes Recent Labs  Lab 11/25/18 1839  TROPONINI <0.03   ------------------------------------------------------------------------------------------------------------------  RADIOLOGY:  Ct Angio Chest Pe W And/or Wo Contrast  Result Date: 11/25/2018 CLINICAL DATA:  Chest pain and shortness of breath, sore throat EXAM: CT  ANGIOGRAPHY CHEST WITH CONTRAST TECHNIQUE: Multidetector CT imaging of the chest was performed using the standard protocol during bolus administration of intravenous contrast. Multiplanar CT image reconstructions and MIPs were obtained to evaluate the vascular anatomy. CONTRAST:  62mL OMNIPAQUE IOHEXOL 350 MG/ML SOLN COMPARISON:  None. FINDINGS: Cardiovascular: Mild four-chamber cardiac enlargement. No significant pericardial effusion. Satisfactory opacification of pulmonary arteries noted, and there is no evidence of pulmonary emboli. Incomplete opacification of the thoracic aorta without any suggestion of dissection or aneurysm. 3 vessel brachiocephalic arterial origin anatomy. Scattered calcified plaque in the arch and descending thoracic segment. Visualized proximal abdominal aorta unremarkable. Mediastinum/Nodes: No hilar or mediastinal adenopathy. Lungs/Pleura: No significant pleural effusion. No pneumothorax. Septal thickening peripherally particularly in the lung bases with somewhat geographic ground-glass opacities with a predominantly perihilar distribution. There is a more focal region of pleural based airspace consolidation in the right upper lobe image 16/6. Upper Abdomen: No acute findings. Musculoskeletal: Minimal spondylitic changes scattered throughout the mid and lower thoracic spine. No fracture or worrisome bone lesion. Review of the MIP images confirms the above findings. IMPRESSION: 1. Negative for acute PE or thoracic aortic dissection. 2. Cardiomegaly with geographic ground-glass opacities and septal thickening suggesting interstitial edema. 3. Focal airspace opacity in the right upper lobe may represent superimposed infectious process versus atypical edema. Consider follow-up to confirm appropriate resolution. Electronically Signed   By: Lucrezia Europe M.D.   On: 11/25/2018 21:20   Dg Chest Port 1 View  Result Date: 11/25/2018 CLINICAL DATA:  Chest pain, shortness of breath. EXAM: PORTABLE  CHEST 1 VIEW COMPARISON:  Radiographs of May 09, 2011. FINDINGS: Stable cardiomegaly. No pneumothorax or pleural effusion is noted. Both lungs are clear. The visualized skeletal structures are unremarkable. IMPRESSION: No active disease. Electronically Signed   By: Marijo Conception M.D.   On: 11/25/2018 19:22      IMPRESSION AND PLAN:   1. atrial fibrillation with RVR - Diltiazem infusing - Telemetry monitoring - Echocardiogram - Cardiology consulted for further evaluation and recommendations - To new to trend troponin levels initial troponin level negative  2.  Community-acquired pneumonia-right upper lobe opacity on chest x-ray - IV antibiotic management with azithromycin  and Rocephin - Sputum culture pending -Duo nebs as needed for shortness of breath or wheezing - Repeat CBC and BMP in the a.m.  3.  Chest pain - Initial troponin negative.  We will continue to trend troponin levels -Echocardiogram and cardiology consult in the a.m. - May be secondary to pneumonia and/or arrhythmia - Patient is on diltiazem infusion for management of atrial fibrillation with RVR --O2 per nasal cannula  4. hyperlipidemia - Lipid panel -Statin therapy continued  DVT and PPI prophylaxis initiated    All the records are reviewed and case discussed with ED provider. The plan of care was discussed in details with the patient (and family). I answered all questions. The patient agreed to proceed with the above mentioned plan. Further management will depend upon hospital course.   CODE STATUS: Full code  TOTAL TIME TAKING CARE OF THIS PATIENT: 45 minutes.    Theo Dills Hava Massingale CRNPon 11/26/2018 at 12:04 AM  Pager - [phone]  After 6pm go to [email or web address] - Proofreader  [facility]  Office  [phone]  CC: Primary care physician; Crecencio Mc, MD   Note: This dictation was prepared with Dragon dictation along with smaller phrase technology. Any  transcriptional errors that result from this process are unintentional.

## 2018-11-26 NOTE — Progress Notes (Signed)
ANTICOAGULATION CONSULT NOTE - Initial Consult  Pharmacy Consult for Heparin  Indication: atrial fibrillation  Allergies  Allergen Reactions  . Codeine Nausea Only and Rash  . Morphine Nausea And Vomiting    Pt allergy may be codeine as codeine or morphine were combined on previous health history. Pt reported upset stomach on questionnaire.  . Sulfa Antibiotics Nausea And Vomiting    Patient Measurements: Height: 5' (152.4 cm) Weight: 157 lb 11.2 oz (71.5 kg) IBW/kg (Calculated) : 45.5 Heparin Dosing Weight: 60.4 kg   Vital Signs: Temp: 99.8 F (37.7 C) (06/09 1937) Temp Source: Oral (06/09 1937) BP: 121/77 (06/09 1937) Pulse Rate: 115 (06/09 1937)  Labs: Recent Labs    11/25/18 1839 11/26/18 0003 11/26/18 0430 11/26/18 1012 11/26/18 2042  HGB 14.5 13.5 12.8  --   --   HCT 43.1 39.9 38.5  --   --   PLT 176 160 154  --   --   APTT  --   --   --  37*  --   LABPROT  --   --  16.1*  --   --   INR  --   --  1.3*  --   --   HEPARINUNFRC  --   --   --   --  0.51  CREATININE 0.83 0.66 0.70  --   --   TROPONINI <0.03  --   --  <0.03  --     Estimated Creatinine Clearance: 56.1 mL/min (by C-G formula based on SCr of 0.7 mg/dL).   Medical History: Past Medical History:  Diagnosis Date  . Depression   . Hyperlipidemia   . Menopausal syndrome (hot flashes) 1992   used HRT fo 2 yrs     Medications:  Medications Prior to Admission  Medication Sig Dispense Refill Last Dose  . Cholecalciferol (VITAMIN D3) 1000 units CAPS Take 1,000 Units by mouth daily.   11/23/2018 at 0800  . Multiple Vitamin (MULTIVITAMIN WITH MINERALS) TABS tablet Take 1 tablet by mouth daily.   11/23/2018 at 0800  . vitamin B-12 (CYANOCOBALAMIN) 1000 MCG tablet Take 1,000 mcg by mouth daily.   11/23/2018 at 0800  . vitamin C (ASCORBIC ACID) 250 MG tablet Take 250 mg by mouth daily.   11/23/2018 at 0800    Assessment: Pharmacy was consulted for anticoagulant management in a 70 YOF admitted for discomfort,  SOB and palpitations. Given patient recently had LMWH, will start heparin gtt 12 hours after last dose of LMWH. Patient does not need a bolus at this time and this was discussed with Dr. Saunders Revel.   BMI 30.80. (BMI >40 lack evidence in the obese patient population)  CHADSVASC score: 2 (age & gender)- ECHO pending   Goal of Therapy:  Heparin level 0.3-0.7 units/ml Monitor platelets by anticoagulation protocol: Yes   Plan:  Start heparin infusion at 800 units/hr @ 1300 Check anti-Xa level in 8 hours (@2100 ) and daily while on heparin Continue to monitor H&H and platelets  6/9: HL @ 2042 = 0.51 Will continue pt on current rate and recheck HL on 6/10 with AM labs.   Melissa Bonilla D 11/26/2018,9:32 PM

## 2018-11-26 NOTE — Progress Notes (Signed)
[facility] [place] NAME: Melissa Bonilla    MR#:  [id number]  DATE OF BIRTH:  1945-11-03  SUBJECTIVE:   Shortness of breath has improved.  Denies palpitations.  REVIEW OF SYSTEMS:    Review of Systems  Constitutional: Negative for fever, chills weight loss HENT: Negative for ear pain, nosebleeds, congestion, facial swelling, rhinorrhea, neck pain, neck stiffness and ear discharge.   Respiratory: Negative for cough, shortness of breath (improved), wheezing  Cardiovascular: Negative for chest pain, palpitations and leg swelling.  Gastrointestinal: Negative for heartburn, abdominal pain, vomiting, diarrhea or consitpation Genitourinary: Negative for dysuria, urgency, frequency, hematuria Musculoskeletal: Negative for back pain or joint pain Neurological: Negative for dizziness, seizures, syncope, focal weakness,  numbness and headaches.  Hematological: Does not bruise/bleed easily.  Psychiatric/Behavioral: Negative for hallucinations, confusion, dysphoric mood    Tolerating Diet: yes      DRUG ALLERGIES:   Allergies  Allergen Reactions  . Codeine Nausea Only and Rash  . Morphine Nausea And Vomiting    Pt allergy may be codeine as codeine or morphine were combined on previous health history. Pt reported upset stomach on questionnaire.  . Sulfa Antibiotics Nausea And Vomiting    VITALS:  Blood pressure 105/67, pulse (!) 56, temperature 97.6 F (36.4 C), temperature source Oral, resp. rate 16, height 5' (1.524 m), weight 71.5 kg, SpO2 93 %.  PHYSICAL EXAMINATION:  Constitutional: Appears well-developed and well-nourished. No distress. HENT: Normocephalic. Marland Kitchen Oropharynx is clear and moist.  Eyes: Conjunctivae and EOM are normal. PERRLA, no scleral icterus.  Neck: Normal ROM. Neck supple. No JVD. No tracheal deviation. CVS: irr, irr tachy, no murmurs, no gallops, no carotid bruit.  Pulmonary: Effort and breath sounds normal, no  stridor, rhonchi, wheezes, rales.  Abdominal: Soft. BS +,  no distension, tenderness, rebound or guarding.  Musculoskeletal: Normal range of motion. No edema and no tenderness.  Neuro: Alert. CN 2-12 grossly intact. No focal deficits. Skin: Skin is warm and dry. No rash noted. Psychiatric: Normal mood and affect.      LABORATORY PANEL:   CBC Recent Labs  Lab 11/26/18 0430  WBC 9.4  HGB 12.8  HCT 38.5  PLT 154   ------------------------------------------------------------------------------------------------------------------  Chemistries  Recent Labs  Lab 11/26/18 0003 11/26/18 0430  NA 137 136  K 4.4 4.1  CL 110 108  CO2 20* 19*  GLUCOSE 137* 122*  BUN 10 11  CREATININE 0.66 0.70  CALCIUM 7.9* 8.3*  AST 38  --   ALT 59*  --   ALKPHOS 80  --   BILITOT 1.2  --    ------------------------------------------------------------------------------------------------------------------  Cardiac Enzymes Recent Labs  Lab 11/25/18 1839 11/26/18 1012  TROPONINI <0.03 <0.03   ------------------------------------------------------------------------------------------------------------------  RADIOLOGY:  Ct Angio Chest Pe W And/or Wo Contrast  Result Date: 11/25/2018 CLINICAL DATA:  Chest pain and shortness of breath, sore throat EXAM: CT ANGIOGRAPHY CHEST WITH CONTRAST TECHNIQUE: Multidetector CT imaging of the chest was performed using the standard protocol during bolus administration of intravenous contrast. Multiplanar CT image reconstructions and MIPs were obtained to evaluate the vascular anatomy. CONTRAST:  55mL OMNIPAQUE IOHEXOL 350 MG/ML SOLN COMPARISON:  None. FINDINGS: Cardiovascular: Mild four-chamber cardiac enlargement. No significant pericardial effusion. Satisfactory opacification of pulmonary arteries noted, and there is no evidence of pulmonary emboli. Incomplete opacification of the thoracic aorta without any suggestion of dissection or aneurysm. 3 vessel  brachiocephalic arterial origin anatomy. Scattered calcified plaque in the arch and descending thoracic  segment. Visualized proximal abdominal aorta unremarkable. Mediastinum/Nodes: No hilar or mediastinal adenopathy. Lungs/Pleura: No significant pleural effusion. No pneumothorax. Septal thickening peripherally particularly in the lung bases with somewhat geographic ground-glass opacities with a predominantly perihilar distribution. There is a more focal region of pleural based airspace consolidation in the right upper lobe image 16/6. Upper Abdomen: No acute findings. Musculoskeletal: Minimal spondylitic changes scattered throughout the mid and lower thoracic spine. No fracture or worrisome bone lesion. Review of the MIP images confirms the above findings. IMPRESSION: 1. Negative for acute PE or thoracic aortic dissection. 2. Cardiomegaly with geographic ground-glass opacities and septal thickening suggesting interstitial edema. 3. Focal airspace opacity in the right upper lobe may represent superimposed infectious process versus atypical edema. Consider follow-up to confirm appropriate resolution. Electronically Signed   By: Lucrezia Europe M.D.   On: 11/25/2018 21:20   Dg Chest Port 1 View  Result Date: 11/25/2018 CLINICAL DATA:  Chest pain, shortness of breath. EXAM: PORTABLE CHEST 1 VIEW COMPARISON:  Radiographs of May 09, 2011. FINDINGS: Stable cardiomegaly. No pneumothorax or pleural effusion is noted. Both lungs are clear. The visualized skeletal structures are unremarkable. IMPRESSION: No active disease. Electronically Signed   By: Marijo Conception M.D.   On: 11/25/2018 19:22     ASSESSMENT AND PLAN:   73 year old female with a history of hyperlipidemia who presented to the ER due to shortness of breath and found to have new onset atrial fibrillation.  1.  New onset atrial fibrillation likely precipitated by community-acquired pneumonia:  Continue diltiazem gtt Follow-up on  echocardiogram Consider NOAC given chads vas score of 2  2.  Right upper lobe community-acquired pneumonia: Continue ceftriaxone and azithromycin. COVID-19 testing was negative.   3.  Chest pain: Patient ruled out for ACS.  Chest pain due to pneumonia and possibly atrial fibrillation. Follow-up and echocardiogram She may benefit from outpatient ischemic evaluation in the future. LDL 95  4.  Mild pulmonary edema: This is likely due to atrial fibrillation with RVR Start Lasix 20 IV twice daily Follow-up on echocardiogram. Follow intake and output with daily weight.   D/w dr end   Management plans discussed with the patient and she is in agreement.  CODE STATUS: full  TOTAL TIME TAKING CARE OF THIS PATIENT: 30 minutes.     POSSIBLE D/C 1-2 days, DEPENDING ON CLINICAL CONDITION.   Bettey Costa M.D on 11/26/2018 at 11:42 AM  Between 7am to 6pm - Pager - [phone] After 6pm go to [email or web address] - password [email or web address] [facility]  Office  [phone]  CC: Primary care physician; Crecencio Mc, MD  Note: This dictation was prepared with Dragon dictation along with smaller phrase technology. Any transcriptional errors that result from this process are unintentional.

## 2018-11-26 NOTE — Consult Note (Signed)
ANTICOAGULATION CONSULT NOTE - Initial Consult  Pharmacy Consult for Enoxaparin Dosing  Indication: atrial fibrillation  Allergies  Allergen Reactions  . Codeine Nausea Only and Rash  . Morphine Nausea And Vomiting    Pt allergy may be codeine as codeine or morphine were combined on previous health history. Pt reported upset stomach on questionnaire.  . Sulfa Antibiotics Nausea And Vomiting   Patient Measurements: Height: 5' (152.4 cm) Weight: 157 lb 11.2 oz (71.5 kg) IBW/kg (Calculated) : 45.5  Vital Signs: Temp: 98.3 F (36.8 C) (06/08 2347) Temp Source: Oral (06/08 2347) BP: 110/87 (06/08 2347) Pulse Rate: 146 (06/08 2347)  Labs: Recent Labs    11/25/18 1839 11/26/18 0003  HGB 14.5 13.5  HCT 43.1 39.9  PLT 176 160  CREATININE 0.83  --   TROPONINI <0.03  --     Estimated Creatinine Clearance: 54.1 mL/min (by C-G formula based on SCr of 0.83 mg/dL).   Medical History: Past Medical History:  Diagnosis Date  . Menopausal syndrome (hot flashes) 1992   used HRT fo 2 yrs    Assessment: Pharmacy consulted for enoxaparin dosing for A. Fib for a 73 yo female admitted with Chest discomfort, SOB and palpitations.  No reported anticoagulates PTA.    Plan:  Start enoxaparin 1mg /kg (~70mg ) every 12 hours  Monitor CBC and Scr daily   Pernell Dupre, PharmD, BCPS Clinical Pharmacist 11/26/2018 12:34 AM

## 2018-11-27 ENCOUNTER — Encounter: Payer: Self-pay | Admitting: *Deleted

## 2018-11-27 DIAGNOSIS — I5021 Acute systolic (congestive) heart failure: Secondary | ICD-10-CM

## 2018-11-27 LAB — CBC
HCT: 37.4 % (ref 36.0–46.0)
Hemoglobin: 12.3 g/dL (ref 12.0–15.0)
MCH: 30.4 pg (ref 26.0–34.0)
MCHC: 32.9 g/dL (ref 30.0–36.0)
MCV: 92.3 fL (ref 80.0–100.0)
Platelets: 147 10*3/uL — ABNORMAL LOW (ref 150–400)
RBC: 4.05 MIL/uL (ref 3.87–5.11)
RDW: 12.7 % (ref 11.5–15.5)
WBC: 9.2 10*3/uL (ref 4.0–10.5)
nRBC: 0 % (ref 0.0–0.2)

## 2018-11-27 LAB — BASIC METABOLIC PANEL
Anion gap: 7 (ref 5–15)
BUN: 14 mg/dL (ref 8–23)
CO2: 24 mmol/L (ref 22–32)
Calcium: 8.4 mg/dL — ABNORMAL LOW (ref 8.9–10.3)
Chloride: 108 mmol/L (ref 98–111)
Creatinine, Ser: 0.84 mg/dL (ref 0.44–1.00)
GFR calc Af Amer: >60 mL/min (ref >60)
GFR calc non Af Amer: >60 mL/min (ref >60)
Glucose, Bld: 119 mg/dL — ABNORMAL HIGH (ref 70–99)
Potassium: 4.3 mmol/L (ref 3.5–5.1)
Sodium: 139 mmol/L (ref 135–145)

## 2018-11-27 LAB — HIV ANTIBODY (ROUTINE TESTING W REFLEX): HIV Screen 4th Generation wRfx: NONREACTIVE

## 2018-11-27 LAB — HEPARIN LEVEL (UNFRACTIONATED): Heparin Unfractionated: 0.45 IU/mL (ref 0.30–0.70)

## 2018-11-27 LAB — GLUCOSE, CAPILLARY: Glucose-Capillary: 185 mg/dL — ABNORMAL HIGH (ref 70–99)

## 2018-11-27 MED ORDER — RIVAROXABAN 20 MG PO TABS
20.0000 mg | ORAL_TABLET | Freq: Every day | ORAL | Status: DC
  Administered 2018-11-27 – 2018-11-30 (×4): 20 mg via ORAL
  Filled 2018-11-27 (×4): qty 1

## 2018-11-27 MED ORDER — METOPROLOL TARTRATE 25 MG PO TABS
25.0000 mg | ORAL_TABLET | Freq: Four times a day (QID) | ORAL | Status: DC
  Administered 2018-11-27 (×3): 25 mg via ORAL
  Filled 2018-11-27 (×3): qty 1

## 2018-11-27 MED ORDER — SODIUM CHLORIDE 0.9 % IV SOLN
INTRAVENOUS | Status: DC
  Administered 2018-11-28: 10:00:00 via INTRAVENOUS

## 2018-11-27 MED ORDER — CEFDINIR 300 MG PO CAPS
300.0000 mg | ORAL_CAPSULE | Freq: Two times a day (BID) | ORAL | Status: DC
  Administered 2018-11-27 – 2018-11-29 (×3): 300 mg via ORAL
  Filled 2018-11-27 (×5): qty 1

## 2018-11-27 NOTE — Progress Notes (Signed)
Progress Note  Patient Name: Melissa Bonilla Date of Encounter: 11/27/2018  Primary Cardiologist: New - Jeniah Kishi  Subjective   Patient reports feeling much better today.  She was nauseated overnight, though this has now resolved.  No significant shortness of breath or chest pain reported this morning.  No palpitations.  Inpatient Medications    Scheduled Meds:  furosemide  20 mg Intravenous BID   metoprolol tartrate  25 mg Oral Q6H   sodium chloride flush  3 mL Intravenous Once   sodium chloride flush  3 mL Intravenous Q12H   Continuous Infusions:  sodium chloride     sodium chloride     azithromycin 500 mg (11/26/18 2152)   cefTRIAXone (ROCEPHIN)  IV 1 g (11/27/18 0012)   heparin 800 Units/hr (11/26/18 1253)   PRN Meds: sodium chloride, acetaminophen, ipratropium-albuterol, ondansetron (ZOFRAN) IV, oxyCODONE-acetaminophen, phenol, sodium chloride flush   Vital Signs    Vitals:   11/27/18 0007 11/27/18 0308 11/27/18 0311 11/27/18 0719  BP: 93/66  96/72 102/86  Pulse: (!) 114  (!) 115 (!) 110  Resp:   20 19  Temp: 98.6 F (37 C)  98.3 F (36.8 C) 98.1 F (36.7 C)  TempSrc: Oral  Oral Oral  SpO2: 97%  96% 98%  Weight:  70.7 kg    Height:        Intake/Output Summary (Last 24 hours) at 11/27/2018 1140 Last data filed at 11/27/2018 1010 Gross per 24 hour  Intake 680.51 ml  Output 2130 ml  Net -1449.49 ml   Last 3 Weights 11/27/2018 11/25/2018 11/25/2018  Weight (lbs) 155 lb 12.8 oz 157 lb 11.2 oz 151 lb  Weight (kg) 70.67 kg 71.532 kg 68.493 kg      Telemetry    Atrial fibrillation with rapid ventricular response (heart rate 100 to 140 bpm with occasional PVCs versus aberrancy) - Personally Reviewed  ECG    No new tracing - Personally Reviewed  Physical Exam   GEN: No acute distress.   Neck:  JVP approximately 10 cm. Cardiac:  Tachycardic and irregularly irregular without murmurs. Respiratory:  Mildly diminished breath sounds at the lung bases.  No  wheezes or crackles. GI: Soft, nontender, non-distended  MS:  Trace pretibial edema; No deformity. Neuro:  Nonfocal  Psych: Normal affect   Labs    Chemistry Recent Labs  Lab 11/26/18 0003 11/26/18 0430 11/27/18 0407  NA 137 136 139  K 4.4 4.1 4.3  CL 110 108 108  CO2 20* 19* 24  GLUCOSE 137* 122* 119*  BUN 10 11 14   CREATININE 0.66 0.70 0.84  CALCIUM 7.9* 8.3* 8.4*  PROT 6.1*  --   --   ALBUMIN 3.5  --   --   AST 38  --   --   ALT 59*  --   --   ALKPHOS 80  --   --   BILITOT 1.2  --   --   GFRNONAA >60 >60 >60  GFRAA >60 >60 >60  ANIONGAP 7 9 7      Hematology Recent Labs  Lab 11/26/18 0003 11/26/18 0430 11/27/18 0407  WBC 12.1* 9.4 9.2  RBC 4.36 4.15 4.05  HGB 13.5 12.8 12.3  HCT 39.9 38.5 37.4  MCV 91.5 92.8 92.3  MCH 31.0 30.8 30.4  MCHC 33.8 33.2 32.9  RDW 12.6 12.7 12.7  PLT 160 154 147*    Cardiac Enzymes Recent Labs  Lab 11/25/18 1839 11/26/18 1012  TROPONINI <0.03 <0.03  No results for input(s): TROPIPOC in the last 168 hours.   BNPNo results for input(s): BNP, PROBNP in the last 168 hours.   DDimer No results for input(s): DDIMER in the last 168 hours.   Radiology    Ct Angio Chest Pe W And/or Wo Contrast  Result Date: 11/25/2018 CLINICAL DATA:  Chest pain and shortness of breath, sore throat EXAM: CT ANGIOGRAPHY CHEST WITH CONTRAST TECHNIQUE: Multidetector CT imaging of the chest was performed using the standard protocol during bolus administration of intravenous contrast. Multiplanar CT image reconstructions and MIPs were obtained to evaluate the vascular anatomy. CONTRAST:  49mL OMNIPAQUE IOHEXOL 350 MG/ML SOLN COMPARISON:  None. FINDINGS: Cardiovascular: Mild four-chamber cardiac enlargement. No significant pericardial effusion. Satisfactory opacification of pulmonary arteries noted, and there is no evidence of pulmonary emboli. Incomplete opacification of the thoracic aorta without any suggestion of dissection or aneurysm. 3 vessel  brachiocephalic arterial origin anatomy. Scattered calcified plaque in the arch and descending thoracic segment. Visualized proximal abdominal aorta unremarkable. Mediastinum/Nodes: No hilar or mediastinal adenopathy. Lungs/Pleura: No significant pleural effusion. No pneumothorax. Septal thickening peripherally particularly in the lung bases with somewhat geographic ground-glass opacities with a predominantly perihilar distribution. There is a more focal region of pleural based airspace consolidation in the right upper lobe image 16/6. Upper Abdomen: No acute findings. Musculoskeletal: Minimal spondylitic changes scattered throughout the mid and lower thoracic spine. No fracture or worrisome bone lesion. Review of the MIP images confirms the above findings. IMPRESSION: 1. Negative for acute PE or thoracic aortic dissection. 2. Cardiomegaly with geographic ground-glass opacities and septal thickening suggesting interstitial edema. 3. Focal airspace opacity in the right upper lobe may represent superimposed infectious process versus atypical edema. Consider follow-up to confirm appropriate resolution. Electronically Signed   By: Lucrezia Europe M.D.   On: 11/25/2018 21:20   Dg Chest Port 1 View  Result Date: 11/25/2018 CLINICAL DATA:  Chest pain, shortness of breath. EXAM: PORTABLE CHEST 1 VIEW COMPARISON:  Radiographs of May 09, 2011. FINDINGS: Stable cardiomegaly. No pneumothorax or pleural effusion is noted. Both lungs are clear. The visualized skeletal structures are unremarkable. IMPRESSION: No active disease. Electronically Signed   By: Marijo Conception M.D.   On: 11/25/2018 19:22    Cardiac Studies   Echo (11/26/2018):  1. The left ventricle has moderately reduced systolic function, with an ejection fraction of 35-40%. The cavity size was normal. Left ventricular diastolic function could not be evaluated secondary to atrial fibrillation. Left ventricular diffuse  hypokinesis.  2. The right ventricle has  normal systolic function. The cavity was normal. There is mildly increased right ventricular wall thickness.  3. Left atrial size was mild-moderately dilated.  4. Right atrial size was mildly dilated.  5. The mitral valve was not well visualized. Mitral valve regurgitation is moderate by color flow Doppler.  6. The tricuspid valve is not well visualized. Tricuspid valve regurgitation is mild-moderate.  7. The aortic valve is tricuspid. Mild thickening of the aortic valve.  8. The aortic root is normal in size and structure.  9. The inferior vena cava was normal in size with <50% respiratory variability.  Patient Profile     73 y.o. female with history of hyperlipidemia, depression, and remote tobacco use, admitted with atrial fibrillation with rapid ventricular response in the setting of pneumonia, also found to have moderately reduced LVEF and acute systolic heart failure.  Assessment & Plan    Atrial fibrillation with rapid ventricular response: Duration of atrial fibrillation remains  uncertain, though I suspect that it has been present for at least a few weeks to months given reduced LVEF.  Ms. Mohiuddin was transitioned from diltiazem to metoprolol yesterday in the setting of reduced LVEF, but her heart rate remains suboptimally controlled.  Switch metoprolol tartrate to 25 mg every 6 hours, with further dose escalation as needed to achieve target resting heart rate less than 110 bpm).  Switch heparin infusion to rivaroxaban 20 mg at dinner this evening.  Given reduced EF and difficulty maintaining adequate heart rate control, I have recommended that we proceed with TEE guided cardioversion tomorrow.  I have discussed the procedure as well as its risks and benefits, with Ms. Police.  She has agreed to proceed.  We will make her n.p.o. after midnight.  Acute systolic heart failure: Echo performed yesterday showed LVEF of 35-40% with global hypokinesis.  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  She has improved symptomatically with diuresis, though I suspect she is still somewhat volume overloaded.  Continue furosemide 20 mg IV twice daily.  Continue metoprolol tartrate 25 mg every 6 hours; this should be consolidated to metoprolol succinate prior to discharge.  Defer addition of ACE inhibitor/ARB and aldosterone antagonist at this time, given low blood pressure and potential need for further escalation of beta-blockade.  Atypical chest pain: Present on admission but improved with treatment of heart failure and pneumonia.  Troponin negative x2.  I do not think her discomfort reflects acute coronary syndrome.  No plans for ischemia evaluation during admission.  This can be readdressed as an outpatient, particularly if LVEF does not improve with heart rate control/restoration of sinus rhythm.  Community-acquired pneumonia:  Ongoing antimicrobial therapy and work-up per internal medicine.  For questions or updates, please contact [facility] Please consult [email or web address] for contact info under [facility].     Signed, Nelva Bush, MD  11/27/2018, 11:40 AM

## 2018-11-27 NOTE — Progress Notes (Signed)
[place] at [place] NAME: Melissa Bonilla    MR#:  [id number]  DATE OF BIRTH:  September 08, 1945  SUBJECTIVE:   Patient reports shortness of breath is much improved.  She denies palpitations or chest pain.  REVIEW OF SYSTEMS:    Review of Systems  Constitutional: Negative for fever, chills weight loss HENT: Negative for ear pain, nosebleeds, congestion, facial swelling, rhinorrhea, neck pain, neck stiffness and ear discharge.   Respiratory: Negative for cough, shortness of breath (improved), no wheezing  Cardiovascular: Negative for chest pain, palpitations and leg swelling.  Gastrointestinal: Negative for heartburn, abdominal pain, vomiting, diarrhea or consitpation Genitourinary: Negative for dysuria, urgency, frequency, hematuria Musculoskeletal: Negative for back pain or joint pain Neurological: Negative for dizziness, seizures, syncope, focal weakness,  numbness and headaches.  Hematological: Does not bruise/bleed easily.  Psychiatric/Behavioral: Negative for hallucinations, confusion, dysphoric mood    Tolerating Diet: yes      DRUG ALLERGIES:   Allergies  Allergen Reactions  . Codeine Nausea Only and Rash  . Morphine Nausea And Vomiting    Pt allergy may be codeine as codeine or morphine were combined on previous health history. Pt reported upset stomach on questionnaire.  . Sulfa Antibiotics Nausea And Vomiting    VITALS:  Blood pressure 102/86, pulse (!) 110, temperature 98.1 F (36.7 C), temperature source Oral, resp. rate 19, height 5' (1.524 m), weight 70.7 kg, SpO2 98 %.  PHYSICAL EXAMINATION:  Constitutional: Appears well-developed and well-nourished. No distress. HENT: Normocephalic. Marland Kitchen Oropharynx is clear and moist.  Eyes: Conjunctivae and EOM are normal. PERRLA, no scleral icterus.  Neck: Normal ROM. Neck supple. No JVD. No tracheal deviation. CVS: irr, irr tachy, no murmurs, no gallops, no carotid bruit.   Pulmonary: Effort and breath sounds normal, no stridor, rhonchi, wheezes, rales.  Abdominal: Soft. BS +,  no distension, tenderness, rebound or guarding.  Musculoskeletal: Normal range of motion. No edema and no tenderness.  Neuro: Alert. CN 2-12 grossly intact. No focal deficits. Skin: Skin is warm and dry. No rash noted. Psychiatric: Normal mood and affect.      LABORATORY PANEL:   CBC Recent Labs  Lab 11/27/18 0407  WBC 9.2  HGB 12.3  HCT 37.4  PLT 147*   ------------------------------------------------------------------------------------------------------------------  Chemistries  Recent Labs  Lab 11/26/18 0003  11/27/18 0407  NA 137   < > 139  K 4.4   < > 4.3  CL 110   < > 108  CO2 20*   < > 24  GLUCOSE 137*   < > 119*  BUN 10   < > 14  CREATININE 0.66   < > 0.84  CALCIUM 7.9*   < > 8.4*  AST 38  --   --   ALT 59*  --   --   ALKPHOS 80  --   --   BILITOT 1.2  --   --    < > = values in this interval not displayed.   ------------------------------------------------------------------------------------------------------------------  Cardiac Enzymes Recent Labs  Lab 11/25/18 1839 11/26/18 1012  TROPONINI <0.03 <0.03   ------------------------------------------------------------------------------------------------------------------  RADIOLOGY:  Ct Angio Chest Pe W And/or Wo Contrast  Result Date: 11/25/2018 CLINICAL DATA:  Chest pain and shortness of breath, sore throat EXAM: CT ANGIOGRAPHY CHEST WITH CONTRAST TECHNIQUE: Multidetector CT imaging of the chest was performed using the standard protocol during bolus administration of intravenous contrast. Multiplanar CT image reconstructions and MIPs were obtained to evaluate the vascular anatomy.  CONTRAST:  16mL OMNIPAQUE IOHEXOL 350 MG/ML SOLN COMPARISON:  None. FINDINGS: Cardiovascular: Mild four-chamber cardiac enlargement. No significant pericardial effusion. Satisfactory opacification of pulmonary arteries  noted, and there is no evidence of pulmonary emboli. Incomplete opacification of the thoracic aorta without any suggestion of dissection or aneurysm. 3 vessel brachiocephalic arterial origin anatomy. Scattered calcified plaque in the arch and descending thoracic segment. Visualized proximal abdominal aorta unremarkable. Mediastinum/Nodes: No hilar or mediastinal adenopathy. Lungs/Pleura: No significant pleural effusion. No pneumothorax. Septal thickening peripherally particularly in the lung bases with somewhat geographic ground-glass opacities with a predominantly perihilar distribution. There is a more focal region of pleural based airspace consolidation in the right upper lobe image 16/6. Upper Abdomen: No acute findings. Musculoskeletal: Minimal spondylitic changes scattered throughout the mid and lower thoracic spine. No fracture or worrisome bone lesion. Review of the MIP images confirms the above findings. IMPRESSION: 1. Negative for acute PE or thoracic aortic dissection. 2. Cardiomegaly with geographic ground-glass opacities and septal thickening suggesting interstitial edema. 3. Focal airspace opacity in the right upper lobe may represent superimposed infectious process versus atypical edema. Consider follow-up to confirm appropriate resolution. Electronically Signed   By: Lucrezia Europe M.D.   On: 11/25/2018 21:20   Dg Chest Port 1 View  Result Date: 11/25/2018 CLINICAL DATA:  Chest pain, shortness of breath. EXAM: PORTABLE CHEST 1 VIEW COMPARISON:  Radiographs of May 09, 2011. FINDINGS: Stable cardiomegaly. No pneumothorax or pleural effusion is noted. Both lungs are clear. The visualized skeletal structures are unremarkable. IMPRESSION: No active disease. Electronically Signed   By: Marijo Conception M.D.   On: 11/25/2018 19:22     ASSESSMENT AND PLAN:   73 year old female with a history of hyperlipidemia who presented to the ER due to shortness of breath and found to have new onset atrial  fibrillation.  1.  New onset atrial fibrillation likely precipitated by community-acquired pneumonia:  Echocardiogram shows EF of 40% with global hypokinesis and moderate MR. On titrate metoprolol to achieve resting heart rate less than 110 bpm Plan for cardioversion in a.m. Continue heparin infusion for now   2.  Right upper lobe community-acquired pneumonia: Continue ceftriaxone and azithromycin. COVID-19 testing was negative.   3.  Chest pain: Patient ruled out for ACS.  Chest pain due to pneumonia and possibly atrial fibrillation. She will likely need outpatient ischemic work-up.   LDL 95  4.  Acute systolic heart failure by echocardiogram with EF of 40% with global hypokinesis: Currently 2 L negative Continue Lasix 20 IV twice daily CHF clinic upon discharge. Follow intake and output with daily weight.   D/w dr end   Management plans discussed with the patient and she is in agreement.  CODE STATUS: full  TOTAL TIME TAKING CARE OF THIS PATIENT: 24 minutes.     POSSIBLE D/C 1-2 days, DEPENDING ON CLINICAL CONDITION.   Bettey Costa M.D on 11/27/2018 at 11:24 AM  Between 7am to 6pm - Pager - [phone] After 6pm go to [email or web address] - password [email or web address] [facility]  Office  [phone]  CC: Primary care physician; Crecencio Mc, MD  Note: This dictation was prepared with Dragon dictation along with smaller phrase technology. Any transcriptional errors that result from this process are unintentional.

## 2018-11-27 NOTE — Progress Notes (Signed)
ANTICOAGULATION CONSULT NOTE - Initial Consult  Pharmacy Consult for Heparin  Indication: atrial fibrillation  Allergies  Allergen Reactions  . Codeine Nausea Only and Rash  . Morphine Nausea And Vomiting    Pt allergy may be codeine as codeine or morphine were combined on previous health history. Pt reported upset stomach on questionnaire.  . Sulfa Antibiotics Nausea And Vomiting    Patient Measurements: Height: 5' (152.4 cm) Weight: 155 lb 12.8 oz (70.7 kg) IBW/kg (Calculated) : 45.5 Heparin Dosing Weight: 60.4 kg   Vital Signs: Temp: 98.4 F (36.9 C) (06/10 1520) Temp Source: Oral (06/10 0719) BP: 101/78 (06/10 1520) Pulse Rate: 119 (06/10 1520)  Labs: Recent Labs    11/25/18 1839 11/26/18 0003 11/26/18 0430 11/26/18 1012 11/26/18 2042 11/27/18 0407  HGB 14.5 13.5 12.8  --   --  12.3  HCT 43.1 39.9 38.5  --   --  37.4  PLT 176 160 154  --   --  147*  APTT  --   --   --  37*  --   --   LABPROT  --   --  16.1*  --   --   --   INR  --   --  1.3*  --   --   --   HEPARINUNFRC  --   --   --   --  0.51 0.45  CREATININE 0.83 0.66 0.70  --   --  0.84  TROPONINI <0.03  --   --  <0.03  --   --     Estimated Creatinine Clearance: 53.1 mL/min (by C-G formula based on SCr of 0.84 mg/dL).   Medical History: Past Medical History:  Diagnosis Date  . Depression   . Hyperlipidemia   . Menopausal syndrome (hot flashes) 1992   used HRT fo 2 yrs     Medications:  Medications Prior to Admission  Medication Sig Dispense Refill Last Dose  . Cholecalciferol (VITAMIN D3) 1000 units CAPS Take 1,000 Units by mouth daily.   11/23/2018 at 0800  . Multiple Vitamin (MULTIVITAMIN WITH MINERALS) TABS tablet Take 1 tablet by mouth daily.   11/23/2018 at 0800  . vitamin B-12 (CYANOCOBALAMIN) 1000 MCG tablet Take 1,000 mcg by mouth daily.   11/23/2018 at 0800  . vitamin C (ASCORBIC ACID) 250 MG tablet Take 250 mg by mouth daily.   11/23/2018 at 0800    Assessment: Pharmacy was consulted for  anticoagulant management in a 45 YOF admitted for discomfort, SOB and palpitations. Given patient recently had LMWH, will start heparin gtt 12 hours after last dose of LMWH. Patient does not need a bolus at this time and this was discussed with Dr. Saunders Revel.   BMI 30.80. (BMI >40 lack evidence in the obese patient population)  CHADSVASC score: 2 (age & gender)- ECHO pending   Goal of Therapy:  Heparin level 0.3-0.7 units/ml Monitor platelets by anticoagulation protocol: Yes   Plan:  06/10 Heparing gtt is scheduled to stop @1700  and pt will start Xarelto 20 mg QD/  Check anti-Xa level and CBC daily while on heparin Continue to monitor H&H and platelets   Rowland Lathe, PharmD Clinical Pharmacist 11/27/2018 3:46 PM

## 2018-11-27 NOTE — Progress Notes (Signed)
ANTICOAGULATION CONSULT NOTE - Initial Consult  Pharmacy Consult for Heparin  Indication: atrial fibrillation  Allergies  Allergen Reactions  . Codeine Nausea Only and Rash  . Morphine Nausea And Vomiting    Pt allergy may be codeine as codeine or morphine were combined on previous health history. Pt reported upset stomach on questionnaire.  . Sulfa Antibiotics Nausea And Vomiting    Patient Measurements: Height: 5' (152.4 cm) Weight: 155 lb 12.8 oz (70.7 kg) IBW/kg (Calculated) : 45.5 Heparin Dosing Weight: 60.4 kg   Vital Signs: Temp: 98.3 F (36.8 C) (06/10 0311) Temp Source: Oral (06/10 0311) BP: 96/72 (06/10 0311) Pulse Rate: 115 (06/10 0311)  Labs: Recent Labs    11/25/18 1839 11/26/18 0003 11/26/18 0430 11/26/18 1012 11/26/18 2042 11/27/18 0407  HGB 14.5 13.5 12.8  --   --  12.3  HCT 43.1 39.9 38.5  --   --  37.4  PLT 176 160 154  --   --  147*  APTT  --   --   --  37*  --   --   LABPROT  --   --  16.1*  --   --   --   INR  --   --  1.3*  --   --   --   HEPARINUNFRC  --   --   --   --  0.51 0.45  CREATININE 0.83 0.66 0.70  --   --  0.84  TROPONINI <0.03  --   --  <0.03  --   --     Estimated Creatinine Clearance: 53.1 mL/min (by C-G formula based on SCr of 0.84 mg/dL).   Medical History: Past Medical History:  Diagnosis Date  . Depression   . Hyperlipidemia   . Menopausal syndrome (hot flashes) 1992   used HRT fo 2 yrs     Medications:  Medications Prior to Admission  Medication Sig Dispense Refill Last Dose  . Cholecalciferol (VITAMIN D3) 1000 units CAPS Take 1,000 Units by mouth daily.   11/23/2018 at 0800  . Multiple Vitamin (MULTIVITAMIN WITH MINERALS) TABS tablet Take 1 tablet by mouth daily.   11/23/2018 at 0800  . vitamin B-12 (CYANOCOBALAMIN) 1000 MCG tablet Take 1,000 mcg by mouth daily.   11/23/2018 at 0800  . vitamin C (ASCORBIC ACID) 250 MG tablet Take 250 mg by mouth daily.   11/23/2018 at 0800    Assessment: Pharmacy was consulted for  anticoagulant management in a 88 YOF admitted for discomfort, SOB and palpitations. Given patient recently had LMWH, will start heparin gtt 12 hours after last dose of LMWH. Patient does not need a bolus at this time and this was discussed with Dr. Saunders Revel.   BMI 30.80. (BMI >40 lack evidence in the obese patient population)  CHADSVASC score: 2 (age & gender)- ECHO pending   Goal of Therapy:  Heparin level 0.3-0.7 units/ml Monitor platelets by anticoagulation protocol: Yes   Plan:  06/10 @ 0407 HL 0.45. Level is therapeutic x 2. Continue current heparin infusion rate of 800 units/hr. Check anti-Xa level and CBC daily while on heparin Continue to monitor H&H and platelets   Pernell Dupre, PharmD, BCPS Clinical Pharmacist 11/27/2018 5:22 AM

## 2018-11-27 NOTE — Progress Notes (Signed)
Pt became  nauseous and c/o of extreme weakness. Pt BP 95/80. CBG WDL.. NP Seals made aware. Pt given cool cloth on her forehead and is in bed stating she feels better, but still very weak. No new orders, will continue to monitor. Pt has no c/o pain or SOB.

## 2018-11-27 NOTE — Anesthesia Preprocedure Evaluation (Addendum)
Anesthesia Evaluation  Patient identified by MRN, date of birth, ID band Patient awake    Reviewed: Allergy & Precautions, H&P , NPO status , reviewed documented beta blocker date and time   Airway Mallampati: II  TM Distance: >3 FB Neck ROM: full    Dental  (+) Upper Dentures, Partial Lower, Missing   Pulmonary former smoker,    Pulmonary exam normal        Cardiovascular + dysrhythmias Atrial Fibrillation  Rhythm:irregular  IMPRESSIONS - ECHO 11/26/2018    1. The left ventricle has moderately reduced systolic function, with an ejection fraction of 35-40%. The cavity size was normal. Left ventricular diastolic function could not be evaluated secondary to atrial fibrillation. Left ventricular diffuse  hypokinesis.  2. The right ventricle has normal systolic function. The cavity was normal. There is mildly increased right ventricular wall thickness.  3. Left atrial size was mild-moderately dilated.  4. Right atrial size was mildly dilated.  5. The mitral valve was not well visualized. Mitral valve regurgitation is moderate by color flow Doppler.  6. The tricuspid valve is not well visualized. Tricuspid valve regurgitation is mild-moderate.  7. The aortic valve is tricuspid. Mild thickening of the aortic valve.  8. The aortic root is normal in size and structure.  9. The inferior vena cava was normal in size with <50% respiratory variability.   Neuro/Psych    GI/Hepatic neg GERD  ,  Endo/Other    Renal/GU      Musculoskeletal   Abdominal   Peds  Hematology   Anesthesia Other Findings Past Medical History: No date: Depression No date: Hyperlipidemia 1992: Menopausal syndrome (hot flashes)     Comment:  used HRT fo 2 yrs    Reproductive/Obstetrics                           Anesthesia Physical Anesthesia Plan  ASA: III  Anesthesia Plan: General   Post-op Pain Management:    Induction:  Intravenous  PONV Risk Score and Plan: 3 and Treatment may vary due to age or medical condition and TIVA  Airway Management Planned: Nasal Cannula and Natural Airway  Additional Equipment:   Intra-op Plan:   Post-operative Plan:   Informed Consent: I have reviewed the patients History and Physical, chart, labs and discussed the procedure including the risks, benefits and alternatives for the proposed anesthesia with the patient or authorized representative who has indicated his/her understanding and acceptance.     Dental Advisory Given  Plan Discussed with: CRNA  Anesthesia Plan Comments:         Anesthesia Quick Evaluation

## 2018-11-28 ENCOUNTER — Inpatient Hospital Stay: Payer: Medicare HMO | Admitting: Anesthesiology

## 2018-11-28 ENCOUNTER — Encounter
Admission: EM | Disposition: A | Discharge status: Home / Self Care | Payer: Self-pay | Source: Home / Self Care | Attending: Internal Medicine

## 2018-11-28 ENCOUNTER — Inpatient Hospital Stay
Admit: 2018-11-28 | Discharge: 2018-11-28 | Disposition: A | Discharge status: Home / Self Care | Payer: Medicare HMO | Attending: Internal Medicine | Admitting: Internal Medicine

## 2018-11-28 ENCOUNTER — Inpatient Hospital Stay: Payer: Medicare HMO

## 2018-11-28 ENCOUNTER — Encounter: Payer: Self-pay | Admitting: *Deleted

## 2018-11-28 DIAGNOSIS — I340 Nonrheumatic mitral (valve) insufficiency: Secondary | ICD-10-CM

## 2018-11-28 DIAGNOSIS — J9601 Acute respiratory failure with hypoxia: Secondary | ICD-10-CM

## 2018-11-28 HISTORY — PX: CARDIOVERSION: SHX1299

## 2018-11-28 HISTORY — PX: TEE WITHOUT CARDIOVERSION: SHX5443

## 2018-11-28 LAB — BLOOD GAS, ARTERIAL
Acid-base deficit: 3.6 mmol/L — ABNORMAL HIGH (ref 0.0–2.0)
Bicarbonate: 20.6 mmol/L (ref 20.0–28.0)
FIO2: 0.36
O2 Saturation: 98.9 %
Patient temperature: 37
pCO2 arterial: 34 mmHg (ref 32.0–48.0)
pH, Arterial: 7.39 (ref 7.350–7.450)
pO2, Arterial: 127 mmHg — ABNORMAL HIGH (ref 83.0–108.0)

## 2018-11-28 LAB — BASIC METABOLIC PANEL
Anion gap: 10 (ref 5–15)
BUN: 15 mg/dL (ref 8–23)
CO2: 21 mmol/L — ABNORMAL LOW (ref 22–32)
Calcium: 8.4 mg/dL — ABNORMAL LOW (ref 8.9–10.3)
Chloride: 110 mmol/L (ref 98–111)
Creatinine, Ser: 0.8 mg/dL (ref 0.44–1.00)
GFR calc Af Amer: >60 mL/min (ref >60)
GFR calc non Af Amer: >60 mL/min (ref >60)
Glucose, Bld: 117 mg/dL — ABNORMAL HIGH (ref 70–99)
Potassium: 3.6 mmol/L (ref 3.5–5.1)
Sodium: 141 mmol/L (ref 135–145)

## 2018-11-28 LAB — CBC
HCT: 36.4 % (ref 36.0–46.0)
Hemoglobin: 12.1 g/dL (ref 12.0–15.0)
MCH: 30.4 pg (ref 26.0–34.0)
MCHC: 33.2 g/dL (ref 30.0–36.0)
MCV: 91.5 fL (ref 80.0–100.0)
Platelets: 154 10*3/uL (ref 150–400)
RBC: 3.98 MIL/uL (ref 3.87–5.11)
RDW: 12.8 % (ref 11.5–15.5)
WBC: 7.6 10*3/uL (ref 4.0–10.5)
nRBC: 0 % (ref 0.0–0.2)

## 2018-11-28 LAB — URINALYSIS, COMPLETE (UACMP) WITH MICROSCOPIC
Bacteria, UA: NONE SEEN
Bilirubin Urine: NEGATIVE
Glucose, UA: NEGATIVE mg/dL
Ketones, ur: 5 mg/dL — AB
Leukocytes,Ua: NEGATIVE
Nitrite: NEGATIVE
Protein, ur: 100 mg/dL — AB
Specific Gravity, Urine: 1.013 (ref 1.005–1.030)
pH: 6 (ref 5.0–8.0)

## 2018-11-28 LAB — GLUCOSE, CAPILLARY: Glucose-Capillary: 97 mg/dL (ref 70–99)

## 2018-11-28 LAB — STREP PNEUMONIAE URINARY ANTIGEN: Strep Pneumo Urinary Antigen: NEGATIVE

## 2018-11-28 LAB — MAGNESIUM: Magnesium: 2 mg/dL (ref 1.7–2.4)

## 2018-11-28 SURGERY — CARDIOVERSION
Anesthesia: General

## 2018-11-28 SURGERY — ECHOCARDIOGRAM, TRANSESOPHAGEAL
Anesthesia: General

## 2018-11-28 MED ORDER — LIDOCAINE VISCOUS HCL 2 % MT SOLN
OROMUCOSAL | Status: AC
  Filled 2018-11-28: qty 15

## 2018-11-28 MED ORDER — AMIODARONE HCL IN DEXTROSE 360-4.14 MG/200ML-% IV SOLN
60.0000 mg/h | INTRAVENOUS | Status: DC
  Filled 2018-11-28: qty 200

## 2018-11-28 MED ORDER — POTASSIUM CHLORIDE CRYS ER 20 MEQ PO TBCR
40.0000 meq | EXTENDED_RELEASE_TABLET | Freq: Once | ORAL | Status: DC
  Filled 2018-11-28: qty 2

## 2018-11-28 MED ORDER — BUTAMBEN-TETRACAINE-BENZOCAINE 2-2-14 % EX AERO
INHALATION_SPRAY | CUTANEOUS | Status: AC
  Filled 2018-11-28: qty 5

## 2018-11-28 MED ORDER — PHENYLEPHRINE 40 MCG/ML (10ML) SYRINGE FOR IV PUSH (FOR BLOOD PRESSURE SUPPORT)
100.0000 ug | PREFILLED_SYRINGE | Freq: Once | INTRAVENOUS | Status: AC
  Administered 2018-11-28: 100 ug via INTRAVENOUS
  Filled 2018-11-28: qty 10

## 2018-11-28 MED ORDER — AMIODARONE HCL IN DEXTROSE 360-4.14 MG/200ML-% IV SOLN
60.0000 mg/h | INTRAVENOUS | Status: DC
  Administered 2018-11-29 (×2): 60 mg/h via INTRAVENOUS
  Administered 2018-11-29: 30 mg/h via INTRAVENOUS
  Administered 2018-11-30 – 2018-12-01 (×5): 60 mg/h via INTRAVENOUS
  Filled 2018-11-28 (×8): qty 200

## 2018-11-28 MED ORDER — PROPOFOL 10 MG/ML IV BOLUS
INTRAVENOUS | Status: DC | PRN
  Administered 2018-11-28: 30 mg via INTRAVENOUS

## 2018-11-28 MED ORDER — AMIODARONE IV BOLUS ONLY 150 MG/100ML
150.0000 mg | Freq: Once | INTRAVENOUS | Status: AC
  Administered 2018-11-28: 150 mg via INTRAVENOUS
  Filled 2018-11-28: qty 100

## 2018-11-28 MED ORDER — MIDAZOLAM HCL 2 MG/2ML IJ SOLN
INTRAMUSCULAR | Status: AC
  Filled 2018-11-28: qty 2

## 2018-11-28 MED ORDER — FLUMAZENIL 0.5 MG/5ML IV SOLN
INTRAVENOUS | Status: AC
  Filled 2018-11-28: qty 5

## 2018-11-28 MED ORDER — AMIODARONE HCL IN DEXTROSE 360-4.14 MG/200ML-% IV SOLN
60.0000 mg/h | INTRAVENOUS | Status: AC
  Administered 2018-11-28 – 2018-11-29 (×2): 60 mg/h via INTRAVENOUS
  Filled 2018-11-28: qty 200

## 2018-11-28 MED ORDER — AMIODARONE LOAD VIA INFUSION
150.0000 mg | Freq: Once | INTRAVENOUS | Status: DC
  Filled 2018-11-28: qty 83.34

## 2018-11-28 MED ORDER — FLUMAZENIL 0.5 MG/5ML IV SOLN
INTRAVENOUS | Status: DC | PRN
  Administered 2018-11-28: 0.2 mg via INTRAVENOUS
  Administered 2018-11-28: .1 mg via INTRAVENOUS

## 2018-11-28 MED ORDER — MIDAZOLAM HCL 2 MG/2ML IJ SOLN
INTRAMUSCULAR | Status: DC | PRN
  Administered 2018-11-28 (×2): 1 mg via INTRAVENOUS

## 2018-11-28 MED ORDER — PHENYLEPHRINE HCL (PRESSORS) 10 MG/ML IV SOLN
INTRAVENOUS | Status: DC | PRN
  Administered 2018-11-28 (×2): 100 ug via INTRAVENOUS

## 2018-11-28 MED ORDER — AMIODARONE HCL IN DEXTROSE 360-4.14 MG/200ML-% IV SOLN
30.0000 mg/h | INTRAVENOUS | Status: DC
  Filled 2018-11-28 (×2): qty 200

## 2018-11-28 MED ORDER — AMIODARONE HCL 150 MG/3ML IV SOLN
INTRAVENOUS | Status: AC
  Filled 2018-11-28: qty 3

## 2018-11-28 MED ORDER — DILTIAZEM HCL 25 MG/5ML IV SOLN
10.0000 mg | Freq: Once | INTRAVENOUS | Status: DC
  Filled 2018-11-28: qty 5

## 2018-11-28 MED ORDER — METOPROLOL TARTRATE 25 MG PO TABS
25.0000 mg | ORAL_TABLET | Freq: Two times a day (BID) | ORAL | Status: DC
  Filled 2018-11-28: qty 1

## 2018-11-28 NOTE — CV Procedure (Signed)
Cardioversion note: A standard informed consent was obtained. Timeout was performed.  Versed was given for sedation. A TEE was performed.  The patient had uncontrollable cough after the probe was passed that was thought to be due to laryngeal spasm.  I was able to take limited images to be able to determine that there was no evidence of left atrial appendage thrombus or intracardiac thrombus  The pads were placed in the anterior posterior fashion. The patient was given a small dose propofol by the anesthesia team.  Successful cardioversion was performed with a 150 J. The patient converted to sinus rhythm.  However, the patient continued to have frequent PVCs.  She also had a run of what seems to be atrial flutter with 2-1 block and heart rate 160 bpm which lasted for about 1-2 minutes.  Thus, I elected to give her a bolus of amiodarone 150 mg.  She had a hypotensive episode after this and was given 1 IV fluid bolus and 100 mcg of phenylephrine.  Pre-and post EKGs were reviewed. The patient tolerated the procedure with no immediate complications.  Recommendations: I decreased metoprolol to 25 mg twice daily.  Continue anticoagulation with Xarelto. Continue close monitoring and if needed transfer to ICU if she continues to have hemodynamic instability.

## 2018-11-28 NOTE — Progress Notes (Signed)
[place] at [place] NAME: Melissa Bonilla    MR#:  [id number]  DATE OF BIRTH:  [birth year]  SUBJECTIVE:   Patient seen in specials In NSR but had to reverse sedation  Possible concern for aspiration   REVIEW OF SYSTEMS:    Review of Systems  Constitutional: Negative for fever, chills weight loss HENT: Negative for ear pain, nosebleeds, congestion, facial swelling, rhinorrhea, neck pain, neck stiffness and ear discharge.   Respiratory: Negative for cough, shortness of breath , no wheezing  Cardiovascular: ++for chest pain, palpitations and leg swelling.  Gastrointestinal: Negative for heartburn, abdominal pain, vomiting, diarrhea or consitpation Genitourinary: Negative for dysuria, urgency, frequency, hematuria Musculoskeletal: Negative for back pain or joint pain Neurological: Negative for dizziness, seizures, syncope, focal weakness,  numbness and headaches.  Hematological: Does not bruise/bleed easily.  Psychiatric/Behavioral: Negative for hallucinations, confusion, dysphoric mood    Tolerating Diet: npo     DRUG ALLERGIES:   Allergies  Allergen Reactions  . Codeine Nausea Only and Rash  . Morphine Nausea And Vomiting    Pt allergy may be codeine as codeine or morphine were combined on previous health history. Pt reported upset stomach on questionnaire.  . Sulfa Antibiotics Nausea And Vomiting    VITALS:  Blood pressure 92/70, pulse 72, temperature 97.6 F (36.4 C), temperature source Oral, resp. rate (!) 23, height 5' (1.524 m), weight 69.8 kg, SpO2 100 %.  PHYSICAL EXAMINATION:  Constitutional: Appears well-developed and well-nourished. No distress. HENT: Normocephalic. Marland Kitchen Oropharynx is clear and moist.  Eyes: Conjunctivae and EOM are normal. PERRLA, no scleral icterus.  Neck: Normal ROM. Neck supple. No JVD. No tracheal deviation. CVS: irr, irr tachy, no murmurs, no gallops, no carotid bruit.  Pulmonary: Effort and  breath sounds normal, no stridor, rhonchi, wheezes, rales.  Abdominal: Soft. BS +,  no distension, tenderness, rebound or guarding.  Musculoskeletal: Normal range of motion. No edema and no tenderness.  Neuro: Alert. CN 2-12 grossly intact. No focal deficits. Skin: Skin is warm and dry. No rash noted. Psychiatric: Normal mood and affect.      LABORATORY PANEL:   CBC Recent Labs  Lab 11/28/18 0436  WBC 7.6  HGB 12.1  HCT 36.4  PLT 154   ------------------------------------------------------------------------------------------------------------------  Chemistries  Recent Labs  Lab 11/26/18 0003  11/28/18 0436  NA 137   < > 141  K 4.4   < > 3.6  CL 110   < > 110  CO2 20*   < > 21*  GLUCOSE 137*   < > 117*  BUN 10   < > 15  CREATININE 0.66   < > 0.80  CALCIUM 7.9*   < > 8.4*  AST 38  --   --   ALT 59*  --   --   ALKPHOS 80  --   --   BILITOT 1.2  --   --    < > = values in this interval not displayed.   ------------------------------------------------------------------------------------------------------------------  Cardiac Enzymes Recent Labs  Lab 11/25/18 1839 11/26/18 1012  TROPONINI <0.03 <0.03   ------------------------------------------------------------------------------------------------------------------  RADIOLOGY:  Dg Chest 1 View  Result Date: 11/28/2018 CLINICAL DATA:  Follow-up aspiration pneumonia EXAM: CHEST  1 VIEW COMPARISON:  11/25/2018 FINDINGS: Cardiac shadow is within normal limits. Lungs are well aerated bilaterally. No sizable effusion is noted. No bony abnormality is seen. IMPRESSION: No acute abnormality noted. Electronically Signed   By: Inez Catalina M.D.   On:  11/28/2018 11:54     ASSESSMENT AND PLAN:   73 year old female with a history of hyperlipidemia who presented to the ER due to shortness of breath and found to have new onset atrial fibrillation.  1.  Acute hypoxic respiratory failure after TEE procedure: Concern for  aspiration Chest x-ray does not show aspiration pneumonia but will need to follow closely. Patient will be transferred to stepdown unit overnight due to complication after TEE procedure.   2 New onset atrial fibrillation likely precipitated by community-acquired pneumonia:  Echocardiogram shows EF of 40% with global hypokinesis and moderate MR. Patient is status post TEE cardioversion  and now normal sinus rhythm Continue metoprolol for heart rate control Continue Xarelto for CVA prevention   3.  Right upper lobe community-acquired pneumonia: Patient may be treated with Omnicef for 1 more day and then discontinue.   COVID-19 testing was negative.   4.  Chest pain: Patient ruled out for ACS.  Chest pain due to pneumonia and possibly atrial fibrillation. She will likely need outpatient ischemic work-up.   LDL 95  5  Acute systolic heart failure by echocardiogram with EF of 40% with global hypokinesis: Currently 2.5 L negative Continue Lasix 20 IV twice daily CHF clinic upon discharge. Follow intake and output with daily weight.   D/w dr Fletcher Anon and kasa   Management plans discussed with the patient and she is in agreement.  CODE STATUS: full  TOTAL TIME TAKING CARE OF THIS PATIENT: 28 minutes.     POSSIBLE D/C 1-2 days, DEPENDING ON CLINICAL CONDITION.   Bettey Costa M.D on 11/28/2018 at 12:24 PM  Between 7am to 6pm - Pager - [phone] After 6pm go to [email or web address] - password [email or web address] [facility]  Office  [phone]  CC: Primary care physician; Crecencio Mc, MD  Note: This dictation was prepared with Dragon dictation along with smaller phrase technology. Any transcriptional errors that result from this process are unintentional.

## 2018-11-28 NOTE — Progress Notes (Signed)
Patient came from ICU at 1630.  At [facility] called and said she had converted back to A Fib with HR as high as 140s.  Spoke to the MDs who wanted to transfer the patient to 2A.  Report was called to Carlyon Shadow, RN and she voiced understanding.  Patient was transported and the night RN Roma Schanz was notified.

## 2018-11-28 NOTE — Plan of Care (Signed)
  Problem: Activity: Goal: Risk for activity intolerance will decrease Outcome: Progressing Note: Up to bathroom and BSC with standby assist.   Problem: Coping: Goal: Level of anxiety will decrease Outcome: Progressing   Problem: Pain Managment: Goal: General experience of comfort will improve Outcome: Progressing Note: No complaints of pain this shift   Problem: Cardiac: Goal: Ability to achieve and maintain adequate cardiopulmonary perfusion will improve Outcome: Not Progressing Note: HR still uncontrolled, but BP still soft, metoprolol was held this morning due to BP ( 91/78 )

## 2018-11-28 NOTE — Progress Notes (Signed)
Called and spoke to Dr. Syble Creek about the patient converting to A Fib again with rate in the 120s-140s.  He gave me a verbal order for Cardizem and to see what Dr. Benjie Karvonen says when she calls back

## 2018-11-28 NOTE — Progress Notes (Signed)
Progress Note  Patient Name: Melissa Bonilla Date of Encounter: 11/28/2018  Primary Cardiologist: new to [facility] - consult by End  Subjective   SOB improved. Still feels weak. No chest pain.   She remains in Afib with RVR with ventricular rates in the 120s to 150s this morning. She is scheduled for TEE-guided DCCV today.   Inpatient Medications    Scheduled Meds: . cefdinir  300 mg Oral Q12H  . furosemide  20 mg Intravenous BID  . metoprolol tartrate  25 mg Oral Q6H  . rivaroxaban  20 mg Oral Q supper  . sodium chloride flush  3 mL Intravenous Once  . sodium chloride flush  3 mL Intravenous Q12H   Continuous Infusions: . sodium chloride Stopped (11/28/18 0009)  . sodium chloride     PRN Meds: sodium chloride, acetaminophen, ipratropium-albuterol, ondansetron (ZOFRAN) IV, oxyCODONE-acetaminophen, phenol, sodium chloride flush   Vital Signs    Vitals:   11/27/18 2010 11/27/18 2248 11/28/18 0351 11/28/18 0723  BP: 104/81  91/78 108/86  Pulse: 69 (!) 103 (!) 118 (!) 124  Resp:    19  Temp: 98 F (36.7 C)  97.7 F (36.5 C) 97.7 F (36.5 C)  TempSrc: Oral  Oral Oral  SpO2: 91% 99% 97% 96%  Weight:   69.8 kg   Height:        Intake/Output Summary (Last 24 hours) at 11/28/2018 0851 Last data filed at 11/28/2018 0353 Gross per 24 hour  Intake 561.51 ml  Output 1110 ml  Net -548.49 ml   Filed Weights   11/25/18 2347 11/27/18 0308 11/28/18 0351  Weight: 71.5 kg 70.7 kg 69.8 kg    Telemetry    Afib with RVR 120s to 150s bpm - Personally Reviewed  ECG    n/a - Personally Reviewed  Physical Exam   GEN: No acute distress.   Neck: No JVD. Cardiac: Tachycardic, irregularly irregular, no murmurs, rubs, or gallops.  Respiratory: Mildly diminished breath sounds along the bases bilaterally.  GI: Soft, nontender, non-distended.   MS: Trace bilateral pretibial edema; No deformity. Neuro:  Alert and oriented x 3; Nonfocal.  Psych: Normal affect.  Labs     Chemistry Recent Labs  Lab 11/26/18 0003 11/26/18 0430 11/27/18 0407 11/28/18 0436  NA 137 136 139 141  K 4.4 4.1 4.3 3.6  CL 110 108 108 110  CO2 20* 19* 24 21*  GLUCOSE 137* 122* 119* 117*  BUN 10 11 14 15   CREATININE 0.66 0.70 0.84 0.80  CALCIUM 7.9* 8.3* 8.4* 8.4*  PROT 6.1*  --   --   --   ALBUMIN 3.5  --   --   --   AST 38  --   --   --   ALT 59*  --   --   --   ALKPHOS 80  --   --   --   BILITOT 1.2  --   --   --   GFRNONAA >60 >60 >60 >60  GFRAA >60 >60 >60 >60  ANIONGAP 7 9 7 10      Hematology Recent Labs  Lab 11/26/18 0430 11/27/18 0407 11/28/18 0436  WBC 9.4 9.2 7.6  RBC 4.15 4.05 3.98  HGB 12.8 12.3 12.1  HCT 38.5 37.4 36.4  MCV 92.8 92.3 91.5  MCH 30.8 30.4 30.4  MCHC 33.2 32.9 33.2  RDW 12.7 12.7 12.8  PLT 154 147* 154    Cardiac Enzymes Recent Labs  Lab 11/25/18 1839  11/26/18 1012  TROPONINI <0.03 <0.03   No results for input(s): TROPIPOC in the last 168 hours.   BNPNo results for input(s): BNP, PROBNP in the last 168 hours.   DDimer No results for input(s): DDIMER in the last 168 hours.   Radiology    No results found.  Cardiac Studies   Echo (11/26/2018): 1. The left ventricle has moderately reduced systolic function, with an ejection fraction of 35-40%. The cavity size was normal. Left ventricular diastolic function could not be evaluated secondary to atrial fibrillation. Left ventricular diffuse hypokinesis. 2. The right ventricle has normal systolic function. The cavity was normal. There is mildly increased right ventricular wall thickness. 3. Left atrial size was mild-moderately dilated. 4. Right atrial size was mildly dilated. 5. The mitral valve was not well visualized. Mitral valve regurgitation is moderate by color flow Doppler. 6. The tricuspid valve is not well visualized. Tricuspid valve regurgitation is mild-moderate. 7. The aortic valve is tricuspid. Mild thickening of the aortic valve. 8. The aortic root is  normal in size and structure. 9. The inferior vena cava was normal in size with <50% respiratory variability.  Patient Profile     73 y.o. female with history of hyperlipidemia, depression, and remote tobacco use, admitted with atrial fibrillation with rapid ventricular response in the setting of pneumonia, also found to have moderately reduced LVEF and acute systolic heart failure.  Assessment & Plan    1. Afib with RVR: -The duration of her Afib remains uncertain, though the feeling has been this has been present for at least a few weeks to months given her cardiomyopathy  -Her ventricular rates remain poorly controlled -She has been transitioned from diltiazem to metoprolol on 6/9 given her reduced ED, though rates remained elevated -She is scheduled for TEE-guided DCCV today -Now on Xarelto in place of heparin gtt as of 6/10 -CHADS2VASc at least 3 (CHF, age x 1, female) -Replete potassium to 4.0 -TSH normal this admission  2. Acute systolic CHF: -Echo this admission showed an LVEF of 35-40% with global HK, which has been felt to be secondary to tachy-mediated cardiomyopathy  -Following DCCV, her Lopressor should be consolidated to Toprol XL prior to discharge given her cardiomyopathy  -ACEi/ARB/Entresto/spironolactone have been deferred at this time given relative hypotension and need for BP room to escalate rate-controlling therapy  -Escalate evidence-based heart failure therapy following cardioversion as able -She will need a repeat limited echo as an outpatient to assess for improvement in her LVEF following DCCV. If her EF remains reduced despite adequate rate/rhythm control she will need an ischemic evaluation  -Can possibly transition to PO Lasix following cardioversion   3. Atypical chest pain: -Her chest discomfort and SOB at presentation may have been in the setting of PNA and acute CHF with symptoms improving following diuresis  -Troponin negative x 2 -Her pain has not  been felt to represent ACS -No plans for inpatient ischemic evaluation at this time -As an outpatient, plan for repeat limited echo to reassess her EF following DCCV, if her EF remains reduced at that time she will need ischemic evaluation   4. CAP: -Per IM  For questions or updates, please contact [place] Please consult [email or web address] for contact info under Cardiology/STEMI.    Signed, Christell Faith, PA-C [facility] Pager: [phone] 11/28/2018, 8:51 AM

## 2018-11-28 NOTE — Anesthesia Post-op Follow-up Note (Signed)
Anesthesia QCDR form completed.        

## 2018-11-28 NOTE — Consult Note (Signed)
  Amiodarone Drug - Drug Interaction Consult Note  Recommendations:     Keep potassium > 4 mmol/L  Follow QTc  F/U any new medications / DDIs  Amiodarone is metabolized by the cytochrome P450 system and therefore has the potential to cause many drug interactions. Amiodarone has an average plasma half-life of 50 days (range 20 to 100 days).   There is potential for drug interactions to occur several weeks or months after stopping treatment and the onset of drug interactions may be slow after initiating amiodarone.   []  Statins: Increased risk of myopathy. Simvastatin- restrict dose to 20mg  daily. Other statins: counsel patients to report any muscle pain or weakness immediately.  []  Anticoagulants: Amiodarone can increase anticoagulant effect. Consider warfarin dose reduction. Patients should be monitored closely and the dose of anticoagulant altered accordingly, remembering that amiodarone levels take several weeks to stabilize.  []  Antiepileptics: Amiodarone can increase plasma concentration of phenytoin, the dose should be reduced. Note that small changes in phenytoin dose can result in large changes in levels. Monitor patient and counsel on signs of toxicity.  [x]  Beta blockers: increased risk of bradycardia, AV block and myocardial depression. Sotalol - avoid concomitant use.  [x]   Calcium channel blockers (diltiazem and verapamil): increased risk of bradycardia, AV block and myocardial depression.  []   Cyclosporine: Amiodarone increases levels of cyclosporine. Reduced dose of cyclosporine is recommended.  []  Digoxin dose should be halved when amiodarone is started.  [x]  Diuretics: increased risk of cardiotoxicity if hypokalemia occurs. K 3.6: replaced with 40 mEq oral KCl  []  Oral hypoglycemic agents (glyburide, glipizide, glimepiride): increased risk of hypoglycemia. Patient's glucose levels should be monitored closely when initiating amiodarone therapy.   [x]  Drugs that  prolong the QT interval:  Torsades de pointes risk may be increased with concurrent use - avoid if possible.  Monitor QTc, also keep magnesium/potassium WNL if concurrent therapy can't be avoided.   Last QTc 434 ms . Antibiotics: e.g. fluoroquinolones, erythromycin. . Antiarrhythmics: e.g. quinidine, procainamide, disopyramide, sotalol. . Antipsychotics: e.g. phenothiazines, haloperidol.  . Lithium, tricyclic antidepressants, and methadone.  Thank You,  Dallie Piles , PharmD 11/28/2018 5:52 PM

## 2018-11-28 NOTE — Progress Notes (Addendum)
Pt amiodarone drip was started at 2017. BP at 137/92 and HR 137. aWill continue to monitor.  Pt was not on any distress on admission. Call bel  And telephone was place close to the patient. Pt was educated about safety. Will continue to monitor.  Update 2245: Pt BP was at 108/76 HR 116 and have a schedule metropolol 25 mg and also on amiodarone drip. Notify prime. Pt Right AC was leaking and was removed  (bruising around the area). New IV Place on right hand Will continue to monitor.  Update 2317: Talked to Dr. Jannifer Franklin and ordered to hold metropolol at this time. Will notify incoming shift. Will continue to monitor.

## 2018-11-28 NOTE — Transfer of Care (Signed)
Immediate Anesthesia Transfer of Care Note  Patient: Melissa Bonilla  Procedure(s) Performed: TRANSESOPHAGEAL ECHOCARDIOGRAM (TEE) (N/A )  Patient Location: PACU  Anesthesia Type:General  Level of Consciousness: awake, alert  and oriented  Airway & Oxygen Therapy: Patient Spontanous Breathing and Patient connected to face mask oxygen  Post-op Assessment: Report given to RN and Post -op Vital signs reviewed and stable  Post vital signs: Reviewed and stable  Last Vitals:  Vitals Value Taken Time  BP    Temp    Pulse 92 11/28/18 1033  Resp 21 11/28/18 1033  SpO2 100 % 11/28/18 1033  Vitals shown include unvalidated device data.  Last Pain:  Vitals:   11/28/18 0937  TempSrc: Oral  PainSc: 0-No pain         Complications: No apparent anesthesia complications

## 2018-11-28 NOTE — Progress Notes (Signed)
   CHIEF COMPLAINT:   Chief Complaint  Patient presents with  . Chest Pain  . Shortness of Breath    Subjective  Admitted to ICU/SD for acute shock and unresponsiveness secondary to sedation  Patient s/p procedure with electrical cardioversion  Patient is alert and awake Follows commands No chest pain No SOB No WOB VS stable She feels fine      Review of Systems:  Gen:  Denies  fever, sweats, chills weigh loss  HEENT: Denies blurred vision, double vision, ear pain, eye pain, hearing loss, nose bleeds, sore throat Cardiac:  No dizziness, chest pain or heaviness, chest tightness,edema, No JVD Resp:   No cough, -sputum production, -shortness of breath,-wheezing, -hemoptysis,  Gi: Denies swallowing difficulty, stomach pain, nausea or vomiting, diarrhea, constipation, bowel incontinence Gu:  Denies bladder incontinence, burning urine Ext:   Denies Joint pain, stiffness or swelling Skin: Denies  skin rash, easy bruising or bleeding or hives Endoc:  Denies polyuria, polydipsia , polyphagia or weight change Psych:   Denies depression, insomnia or hallucinations  Other:  All other systems negative  Objective   Examination:  General exam: Appears calm and comfortable  Respiratory system: Clear to auscultation. Respiratory effort normal. HEENT: [place]/AT, PERRLA, no thrush, no stridor. Cardiovascular system: S1 & S2 heard, RRR. No JVD, murmurs, rubs, gallops or clicks. No pedal edema. Gastrointestinal system: Abdomen is nondistended, soft and nontender. No organomegaly or masses felt. Normal bowel sounds heard. Central nervous system: Alert and oriented. No focal neurological deficits. Extremities: Symmetric 5 x 5 power. Skin: No rashes, lesions or ulcers Psychiatry: Judgement and insight appear normal. Mood & affect appropriate.   VITALS:  height is 5' (1.524 m) and weight is 70.1 kg. Her oral temperature is 97.9 F (36.6 C). Her blood pressure is 105/83 and her pulse is 86.  Her respiration is 18 and oxygen saturation is 97%.   I personally reviewed Labs under Results section.  Radiology Reports Dg Chest 1 View  Result Date: 11/28/2018 CLINICAL DATA:  Follow-up aspiration pneumonia EXAM: CHEST  1 VIEW COMPARISON:  11/25/2018 FINDINGS: Cardiac shadow is within normal limits. Lungs are well aerated bilaterally. No sizable effusion is noted. No bony abnormality is seen. IMPRESSION: No acute abnormality noted. Electronically Signed   By: Inez Catalina M.D.   On: 11/28/2018 11:54       Assessment/Plan:  Acute shock and LOC from sedatives No acute issues at this time  No reason for ICU or SD, patient mat be able to go home in 12-24 hrs depends on what cardiology would like to do.  Transfer to gen med floor   Takeesha Isley Patricia Pesa, M.D.  [facility]  Medical Director [place] Director [facility] Cardio-Pulmonary Department

## 2018-11-28 NOTE — Care Management Important Message (Signed)
Important Message  Patient Details  Name: Melissa Bonilla MRN: [id number] Date of Birth: [birth year]   Medicare Important Message Given:  Yes    Dannette Barbara 11/28/2018, 1:19 PM

## 2018-11-28 NOTE — Progress Notes (Signed)
*  PRELIMINARY RESULTS* Echocardiogram Echocardiogram Transesophageal has been performed.  Sherrie Sport 11/28/2018, 10:23 AM

## 2018-11-28 NOTE — Progress Notes (Signed)
To specials via bed 

## 2018-11-28 NOTE — Anesthesia Postprocedure Evaluation (Signed)
Anesthesia Post Note  Patient: Melissa Bonilla  Procedure(s) Performed: TRANSESOPHAGEAL ECHOCARDIOGRAM (TEE) (N/A )  Patient location during evaluation: Specials Recovery Anesthesia Type: General Level of consciousness: awake and alert Pain management: pain level controlled Vital Signs Assessment: post-procedure vital signs reviewed and stable Respiratory status: spontaneous breathing, nonlabored ventilation and respiratory function stable Cardiovascular status: blood pressure returned to baseline and stable Postop Assessment: no apparent nausea or vomiting Anesthetic complications: no     Last Vitals:  Vitals:   11/28/18 1145 11/28/18 1237  BP:  (!) 96/53  Pulse: 71 73  Resp: (!) 28 20  Temp:    SpO2: 97% 99%    Last Pain:  Vitals:   11/28/18 1145  TempSrc:   PainSc: 0-No pain                 Alphonsus Sias

## 2018-11-28 NOTE — Progress Notes (Signed)
Spoke with Dr. Fletcher Anon about patient converted back into A Fib in the 120s-140s.  He said he was going to put her back on Amiodarone drip and to not give her the Cardizem push.

## 2018-11-29 ENCOUNTER — Encounter: Payer: Self-pay | Admitting: Cardiovascular Disease

## 2018-11-29 DIAGNOSIS — I420 Dilated cardiomyopathy: Secondary | ICD-10-CM

## 2018-11-29 DIAGNOSIS — R0600 Dyspnea, unspecified: Secondary | ICD-10-CM

## 2018-11-29 LAB — BASIC METABOLIC PANEL
Anion gap: 7 (ref 5–15)
BUN: 19 mg/dL (ref 8–23)
CO2: 24 mmol/L (ref 22–32)
Calcium: 7.9 mg/dL — ABNORMAL LOW (ref 8.9–10.3)
Chloride: 107 mmol/L (ref 98–111)
Creatinine, Ser: 0.85 mg/dL (ref 0.44–1.00)
GFR calc Af Amer: >60 mL/min (ref >60)
GFR calc non Af Amer: >60 mL/min (ref >60)
Glucose, Bld: 97 mg/dL (ref 70–99)
Potassium: 3.1 mmol/L — ABNORMAL LOW (ref 3.5–5.1)
Sodium: 138 mmol/L (ref 135–145)

## 2018-11-29 LAB — CBC
HCT: 39 % (ref 36.0–46.0)
Hemoglobin: 12.7 g/dL (ref 12.0–15.0)
MCH: 30.4 pg (ref 26.0–34.0)
MCHC: 32.6 g/dL (ref 30.0–36.0)
MCV: 93.3 fL (ref 80.0–100.0)
Platelets: 167 10*3/uL (ref 150–400)
RBC: 4.18 MIL/uL (ref 3.87–5.11)
RDW: 12.7 % (ref 11.5–15.5)
WBC: 6.3 10*3/uL (ref 4.0–10.5)
nRBC: 0 % (ref 0.0–0.2)

## 2018-11-29 MED ORDER — VITAMIN D3 25 MCG (1000 UNIT) PO TABS
1000.0000 [IU] | ORAL_TABLET | Freq: Every day | ORAL | Status: DC
  Administered 2018-11-30 – 2018-12-01 (×2): 1000 [IU] via ORAL
  Filled 2018-11-29 (×3): qty 1

## 2018-11-29 MED ORDER — ADULT MULTIVITAMIN W/MINERALS CH
1.0000 | ORAL_TABLET | Freq: Every day | ORAL | Status: DC
  Administered 2018-11-30 – 2018-12-01 (×2): 1 via ORAL
  Filled 2018-11-29 (×2): qty 1

## 2018-11-29 MED ORDER — DIGOXIN 250 MCG PO TABS
0.2500 mg | ORAL_TABLET | Freq: Every day | ORAL | Status: DC
  Administered 2018-11-29 – 2018-12-01 (×3): 0.25 mg via ORAL
  Filled 2018-11-29 (×3): qty 1

## 2018-11-29 MED ORDER — VITAMIN B-12 1000 MCG PO TABS
1000.0000 ug | ORAL_TABLET | Freq: Every day | ORAL | Status: DC
  Administered 2018-12-01: 1000 ug via ORAL
  Filled 2018-11-29 (×2): qty 1

## 2018-11-29 MED ORDER — DIGOXIN 0.25 MG/ML IJ SOLN
0.2500 mg | Freq: Once | INTRAMUSCULAR | Status: AC
  Administered 2018-11-29: 0.25 mg via INTRAVENOUS
  Filled 2018-11-29: qty 2

## 2018-11-29 MED ORDER — METOPROLOL TARTRATE 25 MG PO TABS
37.5000 mg | ORAL_TABLET | Freq: Two times a day (BID) | ORAL | Status: DC
  Administered 2018-11-29 – 2018-11-30 (×3): 37.5 mg via ORAL
  Filled 2018-11-29 (×3): qty 2

## 2018-11-29 MED ORDER — POTASSIUM CHLORIDE CRYS ER 20 MEQ PO TBCR
40.0000 meq | EXTENDED_RELEASE_TABLET | Freq: Once | ORAL | Status: AC
  Administered 2018-11-29: 40 meq via ORAL
  Filled 2018-11-29: qty 2

## 2018-11-29 NOTE — Plan of Care (Signed)
  Problem: Education: Goal: Knowledge of General Education information will improve Description: Including pain rating scale, medication(s)/side effects and non-pharmacologic comfort measures Outcome: Progressing   Problem: Activity: Goal: Risk for activity intolerance will decrease Outcome: Progressing   Problem: Cardiac: Goal: Ability to achieve and maintain adequate cardiopulmonary perfusion will improve Outcome: Progressing   Problem: Education: Goal: Ability to demonstrate management of disease process will improve Outcome: Progressing   Problem: Activity: Goal: Capacity to carry out activities will improve Outcome: Progressing

## 2018-11-29 NOTE — Consult Note (Signed)
  Amiodarone Drug - Drug Interaction Consult Note  Recommendations:     Keep potassium > 4 mmol/L  Follow QTc  F/U any new medications / DDIs  Amiodarone is metabolized by the cytochrome P450 system and therefore has the potential to cause many drug interactions. Amiodarone has an average plasma half-life of 50 days (range 20 to 100 days).   There is potential for drug interactions to occur several weeks or months after stopping treatment and the onset of drug interactions may be slow after initiating amiodarone.   []  Statins: Increased risk of myopathy. Simvastatin- restrict dose to 20mg  daily. Other statins: counsel patients to report any muscle pain or weakness immediately.  []  Anticoagulants: Amiodarone can increase anticoagulant effect. Consider warfarin dose reduction. Patients should be monitored closely and the dose of anticoagulant altered accordingly, remembering that amiodarone levels take several weeks to stabilize.  []  Antiepileptics: Amiodarone can increase plasma concentration of phenytoin, the dose should be reduced. Note that small changes in phenytoin dose can result in large changes in levels. Monitor patient and counsel on signs of toxicity.  [x]  Beta blockers: increased risk of bradycardia, AV block and myocardial depression. Sotalol - avoid concomitant use.  [x]   Calcium channel blockers (diltiazem and verapamil): increased risk of bradycardia, AV block and myocardial depression.  []   Cyclosporine: Amiodarone increases levels of cyclosporine. Reduced dose of cyclosporine is recommended.  [x]  Digoxin dose should be halved when amiodarone is started.  [x]  Diuretics: increased risk of cardiotoxicity if hypokalemia occurs. K 3.1: replaced with 40 mEq oral KCl  []  Oral hypoglycemic agents (glyburide, glipizide, glimepiride): increased risk of hypoglycemia. Patient's glucose levels should be monitored closely when initiating amiodarone therapy.   [x]  Drugs that  prolong the QT interval:  Torsades de pointes risk may be increased with concurrent use - avoid if possible.  Monitor QTc, also keep magnesium/potassium WNL if concurrent therapy can't be avoided.   Last QTc 434 ms . Antibiotics: e.g. fluoroquinolones, erythromycin. . Antiarrhythmics: e.g. quinidine, procainamide, disopyramide, sotalol. . Antipsychotics: e.g. phenothiazines, haloperidol.  . Lithium, tricyclic antidepressants, and methadone.  Thank You,  Rowland Lathe , PharmD 11/29/2018 1:00 PM

## 2018-11-29 NOTE — Progress Notes (Signed)
Progress Note  Patient Name: Melissa Bonilla Date of Encounter: 11/29/2018  Primary Cardiologist: new to [facility] - consult by End  Subjective   Status post briefly successful TEE-guided DCCV on 6/43/3295 which was complicated by hypoxia with reversal of anesthesia with flumazenil followed by possible atrial flutter with 2:1 AV block with a rate of 150 bpm. With this, she was given 150 mg bolus of amiodarone with development of hypotension requiring IV fluids and 2 doses of phenylephrine with subsequent improvement in BP. She is back in Afib with RVR this morning with ventricular rates in the 1-teens to 120s bpm.   No chest pain. SOB continues to improve. No palpitations.   BP stable in the 1-teens systolic. Potassium low this morning, repletion is ordered.   Inpatient Medications    Scheduled Meds: . amiodarone  150 mg Intravenous Once  . cefdinir  300 mg Oral Q12H  . diltiazem  10 mg Intravenous Once  . furosemide  20 mg Intravenous BID  . metoprolol tartrate  25 mg Oral BID  . potassium chloride  40 mEq Oral Once  . rivaroxaban  20 mg Oral Q supper  . sodium chloride flush  3 mL Intravenous Once  . sodium chloride flush  3 mL Intravenous Q12H   Continuous Infusions: . sodium chloride 0 mL/hr at 11/28/18 0009  . amiodarone 30 mg/hr (11/29/18 0219)   PRN Meds: sodium chloride, acetaminophen, ipratropium-albuterol, ondansetron (ZOFRAN) IV, oxyCODONE-acetaminophen, phenol, sodium chloride flush   Vital Signs    Vitals:   11/29/18 0630 11/29/18 0645 11/29/18 0700 11/29/18 0715  BP: 122/87 (!) 114/95 (!) 117/93 (!) 118/92  Pulse: 79 (!) 109 (!) 123 (!) 127  Resp:      Temp:      TempSrc:      SpO2:      Weight:      Height:        Intake/Output Summary (Last 24 hours) at 11/29/2018 0733 Last data filed at 11/29/2018 0543 Gross per 24 hour  Intake 596.18 ml  Output 500 ml  Net 96.18 ml   Filed Weights   11/28/18 1301 11/28/18 1955 11/29/18 0314  Weight: 70.1 kg  70.4 kg 69.6 kg    Telemetry    Afib with RVR, 1-teens to 120s bpm - Personally Reviewed  ECG    NSR, 76 bpm, PACs, diffuse TWI - Personally Reviewed  Physical Exam   GEN: No acute distress.   Neck: No JVD. Cardiac: Tachycardic, irregularly irregular, no murmurs, rubs, or gallops.  Respiratory: Bibasilar crackles.  GI: Soft, nontender, non-distended.   MS: Trace bilateral pretibial edema bilaterally; No deformity. Neuro:  Alert and oriented x 3; Nonfocal.  Psych: Normal affect.  Labs    Chemistry Recent Labs  Lab 11/26/18 0003  11/27/18 0407 11/28/18 0436 11/29/18 0431  NA 137   < > 139 141 138  K 4.4   < > 4.3 3.6 3.1*  CL 110   < > 108 110 107  CO2 20*   < > 24 21* 24  GLUCOSE 137*   < > 119* 117* 97  BUN 10   < > 14 15 19   CREATININE 0.66   < > 0.84 0.80 0.85  CALCIUM 7.9*   < > 8.4* 8.4* 7.9*  PROT 6.1*  --   --   --   --   ALBUMIN 3.5  --   --   --   --   AST 38  --   --   --   --  ALT 59*  --   --   --   --   ALKPHOS 80  --   --   --   --   BILITOT 1.2  --   --   --   --   GFRNONAA >60   < > >60 >60 >60  GFRAA >60   < > >60 >60 >60  ANIONGAP 7   < > 7 10 7    < > = values in this interval not displayed.     Hematology Recent Labs  Lab 11/27/18 0407 11/28/18 0436 11/29/18 0431  WBC 9.2 7.6 6.3  RBC 4.05 3.98 4.18  HGB 12.3 12.1 12.7  HCT 37.4 36.4 39.0  MCV 92.3 91.5 93.3  MCH 30.4 30.4 30.4  MCHC 32.9 33.2 32.6  RDW 12.7 12.8 12.7  PLT 147* 154 167    Cardiac Enzymes Recent Labs  Lab 11/25/18 1839 11/26/18 1012  TROPONINI <0.03 <0.03   No results for input(s): TROPIPOC in the last 168 hours.   BNPNo results for input(s): BNP, PROBNP in the last 168 hours.   DDimer No results for input(s): DDIMER in the last 168 hours.   Radiology    Dg Chest 1 View  Result Date: 11/28/2018 IMPRESSION: No acute abnormality noted. Electronically Signed   By: Inez Catalina M.D.   On: 11/28/2018 11:54    Cardiac Studies   Echo (11/26/2018): 1.  The left ventricle has moderately reduced systolic function, with an ejection fraction of 35-40%. The cavity size was normal. Left ventricular diastolic function could not be evaluated secondary to atrial fibrillation. Left ventricular diffuse hypokinesis. 2. The right ventricle has normal systolic function. The cavity was normal. There is mildly increased right ventricular wall thickness. 3. Left atrial size was mild-moderately dilated. 4. Right atrial size was mildly dilated. 5. The mitral valve was not well visualized. Mitral valve regurgitation is moderate by color flow Doppler. 6. The tricuspid valve is not well visualized. Tricuspid valve regurgitation is mild-moderate. 7. The aortic valve is tricuspid. Mild thickening of the aortic valve. 8. The aortic root is normal in size and structure. 9. The inferior vena cava was normal in size with <50% respiratory variability. __________  TEE 11/28/2018: 1. The left ventricle has mild-moderately reduced systolic function, with an ejection fraction of 40-45%. The cavity size was normal. Left ventricular diastolic function could not be evaluated.  2. The right ventricle has normal systolc function. The cavity was normal. There is no increase in right ventricular wall thickness.  3. Left atrial size was mildly dilated.  4. The mitral valve is grossly normal.  5. The tricuspid valve was grossly normal.  6. No intracardiac thrombi or masses were visualized.  7. The patient developed uncontrolled cough few minutes after the probe was inserted due to suspected laryngeal spasm. Thus, a complete study was not performed but I was able to determine that there was no evidence of thrombus.  Patient Profile     73 y.o. female with history of  hyperlipidemia, depression, and remote tobacco use, admitted with atrial fibrillation with rapid ventricular response in the setting of pneumonia, also found to have moderately reduced LVEF and acute systolic heart  failure.  Assessment & Plan    1. Afib with RVR: -Status post briefly successful TEE-guided DCCV on 0/27/2536 which was complicated by hypoxia with reversal of anesthesia with flumazenil followed by possible atrial flutter with 2:1 AV block with a rate of 150 bpm. With this, she was given  150 mg bolus of amiodarone with development of hypotension requiring IV fluids and 2 doses of phenylephrine with subsequent improvement in BP -She is back in Afib with RVR this morning with ventricular rates in the 1-teens to 120s bpm -Continue to load with IV amiodarone with plans for repeat DCCV first of next week after she has been loaded with AAT -Change Lopressor to 37.5 mg bid for added rate control  -Continue Xarelto -CHADS2VASc at least 3 (CHF, age x 1, female) -Replete potassium to 4.0 -TSH normal this admission  2. 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 Followig adequate rate/rhythm control this should be consolidated to Toprol XL given her cardiomyopathy -Attempt to avoid diltiazem given her cardiomyopathy  -ACEi/ARB/Entresto/spironolactone have been deferred at this time given relative hypotension and need for BP room to escalate rate-controlling therapy  -Escalate evidence-based heart failure therapy following cardioversion as able -She will need a repeat limited echo as an outpatient to assess for improvement in her LVEF following adequate rate/rhythm/repeatDCCV. If her EF remains reduced despite adequate rate/rhythm control she will need an ischemic evaluation  -Continue gentle diuresis with IV Lasix   3. Atypical chest pain: -Her chest discomfort and SOB at presentation may have been in the setting of PNA and acute CHF with symptoms improving following diuresis  -Troponin negative x 2  -Her pain has not been felt to represent ACS -No plans for inpatient ischemic evaluation at this time -As an outpatient, plan for repeat limited echo to reassess her EF following DCCV, if her EF remains reduced at that time she will need ischemic evaluation   4. CAP: -CXR following TEE-guided DCCV did not reveal aspiration PNA -Per IM  For questions or updates, please contact [place] Please consult [email or web address] for contact info under Cardiology/STEMI.    Signed, Christell Faith, PA-C [facility] Pager: [phone] 11/29/2018, 7:33 AM

## 2018-11-29 NOTE — Progress Notes (Signed)
[place] at [place] NAME: Melissa Bonilla    MR#:  [id number]  DATE OF BIRTH:  1946-02-25  SUBJECTIVE:   Patient converted back into atrial fibrillation status post cardioversion yesterday.  REVIEW OF SYSTEMS:    Review of Systems  Constitutional: Negative for fever, chills weight loss HENT: Negative for ear pain, nosebleeds, congestion, facial swelling, rhinorrhea, neck pain, neck stiffness and ear discharge.   Respiratory: Negative for cough, denies shortness of breath , no wheezing  Cardiovascular: Denies, palpitations and leg swelling.  Gastrointestinal: Negative for heartburn, abdominal pain, vomiting, diarrhea or consitpation Genitourinary: Negative for dysuria, urgency, frequency, hematuria Musculoskeletal: Negative for back pain or joint pain Neurological: Negative for dizziness, seizures, syncope, focal weakness,  numbness and headaches.  Hematological: Does not bruise/bleed easily.  Psychiatric/Behavioral: Negative for hallucinations, confusion, dysphoric mood    Tolerating Diet: yes    DRUG ALLERGIES:   Allergies  Allergen Reactions  . Codeine Nausea Only and Rash  . Morphine Nausea And Vomiting    Pt allergy may be codeine as codeine or morphine were combined on previous health history. Pt reported upset stomach on questionnaire.  . Sulfa Antibiotics Nausea And Vomiting    VITALS:  Blood pressure (!) 144/100, pulse (!) 138, temperature 98 F (36.7 C), temperature source Oral, resp. rate 19, height 5' (1.524 m), weight 69.6 kg, SpO2 95 %.  PHYSICAL EXAMINATION:  Constitutional: Appears well-developed and well-nourished. No distress. HENT: Normocephalic. Marland Kitchen Oropharynx is clear and moist.  Eyes: Conjunctivae and EOM are normal. PERRLA, no scleral icterus.  Neck: Normal ROM. Neck supple. No JVD. No tracheal deviation. CVS: irr, irr tachy, no murmurs, no gallops, no carotid bruit.  Pulmonary: Crackles at right base no  wheezing or rhonchi. Abdominal: Soft. BS +,  no distension, tenderness, rebound or guarding.  Musculoskeletal: Normal range of motion. No edema and no tenderness.  Neuro: Alert. CN 2-12 grossly intact. No focal deficits. Skin: Skin is warm and dry. No rash noted. Psychiatric: Normal mood and affect.      LABORATORY PANEL:   CBC Recent Labs  Lab 11/29/18 0431  WBC 6.3  HGB 12.7  HCT 39.0  PLT 167   ------------------------------------------------------------------------------------------------------------------  Chemistries  Recent Labs  Lab 11/26/18 0003  11/28/18 0436 11/29/18 0431  NA 137   < > 141 138  K 4.4   < > 3.6 3.1*  CL 110   < > 110 107  CO2 20*   < > 21* 24  GLUCOSE 137*   < > 117* 97  BUN 10   < > 15 19  CREATININE 0.66   < > 0.80 0.85  CALCIUM 7.9*   < > 8.4* 7.9*  MG  --   --  2.0  --   AST 38  --   --   --   ALT 59*  --   --   --   ALKPHOS 80  --   --   --   BILITOT 1.2  --   --   --    < > = values in this interval not displayed.   ------------------------------------------------------------------------------------------------------------------  Cardiac Enzymes Recent Labs  Lab 11/25/18 1839 11/26/18 1012  TROPONINI <0.03 <0.03   ------------------------------------------------------------------------------------------------------------------  RADIOLOGY:  Dg Chest 1 View  Result Date: 11/28/2018 CLINICAL DATA:  Follow-up aspiration pneumonia EXAM: CHEST  1 VIEW COMPARISON:  11/25/2018 FINDINGS: Cardiac shadow is within normal limits. Lungs are well aerated bilaterally. No sizable effusion is  noted. No bony abnormality is seen. IMPRESSION: No acute abnormality noted. Electronically Signed   By: Inez Catalina M.D.   On: 11/28/2018 11:54     ASSESSMENT AND PLAN:   73 year old female with a history of hyperlipidemia who presented to the ER due to shortness of breath and found to have new onset atrial fibrillation.  1.  Acute hypoxic  respiratory failure after TEE procedure: Chest x-ray showed no evidence of aspiration pneumonia. She has been weaned off of oxygen. Hypoxic respiratory failure after procedure likely anesthesia related.  2 New onset atrial fibrillation likely precipitated by community-acquired pneumonia:  Echocardiogram shows EF of 40% with global hypokinesis and moderate MR. Patient is status post successful TEE cardioversion. She is currently on amiodarone drip. If she remains in atrial fibrillation over the weekend plan to repeat cardioversion next week. Continue Lopressor for added rate control. Continue Xarelto for stroke prevention.   3.  Right upper lobe community-acquired pneumonia: Patient has been treated for pneumonia and antibiotic can be discontinued today.    COVID-19 testing was negative.   4.  Chest pain: Patient ruled out for ACS.  Chest pain due to pneumonia and atrial fibrillation. She will need a repeat limited echo as an outpatient to assess for improvement in her LVEF following adequate rate/rhythm/repeatDCCV. If her EF remains reduced despite adequate rate/rhythm control she will need an ischemic evaluation5  Acute systolic heart failure by echocardiogram with EF of 35- 40% with global hypokinesis: Currently 2.3 L negative Continue Lasix 20 IV twice daily CHF clinic upon discharge. Follow intake and output with daily weight.   D/w dr Rockey Situ  Management plans discussed with the patient and she is in agreement.  CODE STATUS: full  TOTAL TIME TAKING CARE OF THIS PATIENT: 28 minutes.     POSSIBLE D/C2-4 days DEPENDING ON CLINICAL CONDITION.   Bettey Costa M.D on 11/29/2018 at 10:42 AM  Between 7am to 6pm - Pager - [phone] After 6pm go to [email or web address] - password [email or web address] [facility]  Office  [phone]  CC: Primary care physician; Crecencio Mc, MD  Note: This dictation was prepared with Dragon dictation along with smaller phrase  technology. Any transcriptional errors that result from this process are unintentional.

## 2018-11-30 DIAGNOSIS — R079 Chest pain, unspecified: Secondary | ICD-10-CM

## 2018-11-30 LAB — BASIC METABOLIC PANEL
Anion gap: 8 (ref 5–15)
BUN: 16 mg/dL (ref 8–23)
CO2: 29 mmol/L (ref 22–32)
Calcium: 8.8 mg/dL — ABNORMAL LOW (ref 8.9–10.3)
Chloride: 102 mmol/L (ref 98–111)
Creatinine, Ser: 0.91 mg/dL (ref 0.44–1.00)
GFR calc Af Amer: >60 mL/min (ref >60)
GFR calc non Af Amer: >60 mL/min (ref >60)
Glucose, Bld: 108 mg/dL — ABNORMAL HIGH (ref 70–99)
Potassium: 3.8 mmol/L (ref 3.5–5.1)
Sodium: 139 mmol/L (ref 135–145)

## 2018-11-30 LAB — CBC
HCT: 42.9 % (ref 36.0–46.0)
Hemoglobin: 14.1 g/dL (ref 12.0–15.0)
MCH: 30.5 pg (ref 26.0–34.0)
MCHC: 32.9 g/dL (ref 30.0–36.0)
MCV: 92.9 fL (ref 80.0–100.0)
Platelets: 218 10*3/uL (ref 150–400)
RBC: 4.62 MIL/uL (ref 3.87–5.11)
RDW: 12.4 % (ref 11.5–15.5)
WBC: 6.6 10*3/uL (ref 4.0–10.5)
nRBC: 0 % (ref 0.0–0.2)

## 2018-11-30 LAB — CULTURE, BLOOD (ROUTINE X 2)
Culture: NO GROWTH
Culture: NO GROWTH

## 2018-11-30 LAB — MAGNESIUM: Magnesium: 1.8 mg/dL (ref 1.7–2.4)

## 2018-11-30 MED ORDER — AMIODARONE HCL 200 MG PO TABS
400.0000 mg | ORAL_TABLET | Freq: Two times a day (BID) | ORAL | Status: DC
  Administered 2018-11-30 – 2018-12-01 (×3): 400 mg via ORAL
  Filled 2018-11-30 (×3): qty 2

## 2018-11-30 MED ORDER — FUROSEMIDE 20 MG PO TABS
20.0000 mg | ORAL_TABLET | Freq: Two times a day (BID) | ORAL | Status: DC
  Administered 2018-11-30 – 2018-12-01 (×2): 20 mg via ORAL
  Filled 2018-11-30 (×2): qty 1

## 2018-11-30 MED ORDER — METOPROLOL TARTRATE 50 MG PO TABS
50.0000 mg | ORAL_TABLET | Freq: Two times a day (BID) | ORAL | Status: DC
  Administered 2018-11-30 – 2018-12-01 (×2): 50 mg via ORAL
  Filled 2018-11-30 (×2): qty 1

## 2018-11-30 NOTE — Plan of Care (Signed)
  Problem: Education: Goal: Knowledge of General Education information will improve Description: Including pain rating scale, medication(s)/side effects and non-pharmacologic comfort measures Outcome: Progressing   Problem: Clinical Measurements: Goal: Ability to maintain clinical measurements within normal limits will improve Outcome: Progressing   Problem: Cardiac: Goal: Ability to achieve and maintain adequate cardiopulmonary perfusion will improve Outcome: Progressing

## 2018-11-30 NOTE — Progress Notes (Signed)
[place] at [place] NAME: Melissa Bonilla    MR#:  [id number]  DATE OF BIRTH:  06-18-1946  SUBJECTIVE:   Patient asymptomatic doing better  REVIEW OF SYSTEMS:    Review of Systems  Constitutional: Negative for fever, chills weight loss HENT: Negative for ear pain, nosebleeds, congestion, facial swelling, rhinorrhea, neck pain, neck stiffness and ear discharge.   Respiratory: Negative for cough, denies shortness of breath , no wheezing  Cardiovascular: Denies, palpitations and leg swelling.  Gastrointestinal: Negative for heartburn, abdominal pain, vomiting, diarrhea or consitpation Genitourinary: Negative for dysuria, urgency, frequency, hematuria Musculoskeletal: Negative for back pain or joint pain Neurological: Negative for dizziness, seizures, syncope, focal weakness,  numbness and headaches.  Hematological: Does not bruise/bleed easily.  Psychiatric/Behavioral: Negative for hallucinations, confusion, dysphoric mood    Tolerating Diet: yes    DRUG ALLERGIES:   Allergies  Allergen Reactions  . Codeine Nausea Only and Rash  . Morphine Nausea And Vomiting    Pt allergy may be codeine as codeine or morphine were combined on previous health history. Pt reported upset stomach on questionnaire.  . Sulfa Antibiotics Nausea And Vomiting    VITALS:  Blood pressure 114/89, pulse 86, temperature 98 F (36.7 C), resp. rate 18, height 5' (1.524 m), weight 69.5 kg, SpO2 98 %.  PHYSICAL EXAMINATION:  Constitutional: Appears well-developed and well-nourished. No distress. HENT: Normocephalic. Marland Kitchen Oropharynx is clear and moist.  Eyes: Conjunctivae and EOM are normal. PERRLA, no scleral icterus.  Neck: Normal ROM. Neck supple. No JVD. No tracheal deviation. CVS: irr, irr tachy, no murmurs, no gallops, no carotid bruit.  Pulmonary: Crackles at right base no wheezing or rhonchi. Abdominal: Soft. BS +,  no distension, tenderness, rebound or  guarding.  Musculoskeletal: Normal range of motion. No edema and no tenderness.  Neuro: Alert. CN 2-12 grossly intact. No focal deficits. Skin: Skin is warm and dry. No rash noted. Psychiatric: Normal mood and affect.      LABORATORY PANEL:   CBC Recent Labs  Lab 11/30/18 0432  WBC 6.6  HGB 14.1  HCT 42.9  PLT 218   ------------------------------------------------------------------------------------------------------------------  Chemistries  Recent Labs  Lab 11/26/18 0003  11/30/18 0432  NA 137   < > 139  K 4.4   < > 3.8  CL 110   < > 102  CO2 20*   < > 29  GLUCOSE 137*   < > 108*  BUN 10   < > 16  CREATININE 0.66   < > 0.91  CALCIUM 7.9*   < > 8.8*  MG  --    < > 1.8  AST 38  --   --   ALT 59*  --   --   ALKPHOS 80  --   --   BILITOT 1.2  --   --    < > = values in this interval not displayed.   ------------------------------------------------------------------------------------------------------------------  Cardiac Enzymes Recent Labs  Lab 11/25/18 1839 11/26/18 1012  TROPONINI <0.03 <0.03   ------------------------------------------------------------------------------------------------------------------  RADIOLOGY:  No results found.   ASSESSMENT AND PLAN:   73 year old female with a history of hyperlipidemia who presented to the ER due to shortness of breath and found to have new onset atrial fibrillation.  1.  Acute hypoxic respiratory failure after TEE procedure: Chest x-ray showed no evidence of aspiration pneumonia. She has been weaned off of oxygen. Hypoxic respiratory failure after procedure likely anesthesia related.  2 New onset atrial  fibrillation likely precipitated by community-acquired pneumonia:  Echocardiogram shows EF of 40% with global hypokinesis and moderate MR. Patient is status post successful TEE cardioversion. Converted to oral amiodarone Continue Xarelto Per cardiology okay to discharge tomorrow if stable   3.   Right upper lobe community-acquired pneumonia: Status post treatment with antibiotic COVID-19 testing was negative.   4.  Chest pain: Patient ruled out for ACS.  Chest pain due to pneumonia and atrial fibrillation. She will need a repeat limited echo as an outpatient to assess for improvement in her LVEF following adequate rate/rhythm/repeatDCCV. If her EF remains reduced despite adequate rate/rhythm control she will need an ischemic evaluation5  Acute systolic heart failure by echocardiogram with EF of 35- 40% with global hypokinesis: Currently 2.3 L negative CHF clinic upon discharge. Follow intake and output with daily weight. Change to oral Lasix  D/w dr Rockey Situ  Management plans discussed with the patient and she is in agreement.  CODE STATUS: full  TOTAL TIME TAKING CARE OF THIS PATIENT: 28 minutes.     POSSIBLE D/C2-4 days DEPENDING ON CLINICAL CONDITION.   Dustin Flock M.D on 11/30/2018 at 1:56 PM  Between 7am to 6pm - Pager - [phone] After 6pm go to [email or web address] - password [email or web address] [place] [facility]  Office  [phone]  CC: Primary care physician; Crecencio Mc, MD  Note: This dictation was prepared with Dragon dictation along with smaller phrase technology. Any transcriptional errors that result from this process are unintentional.

## 2018-11-30 NOTE — Consult Note (Signed)
  Amiodarone Drug - Drug Interaction Consult Note  Recommendations:  On digoxin- recommend getting level in 5 days. Started on 6/12.    Keep potassium > 4 mmol/L  Follow QTc 6/11 QTc 434.   No new DDI  Amiodarone is metabolized by the cytochrome P450 system and therefore has the potential to cause many drug interactions. Amiodarone has an average plasma half-life of 50 days (range 20 to 100 days).   There is potential for drug interactions to occur several weeks or months after stopping treatment and the onset of drug interactions may be slow after initiating amiodarone.   []  Statins: Increased risk of myopathy. Simvastatin- restrict dose to 20mg  daily. Other statins: counsel patients to report any muscle pain or weakness immediately.  []  Anticoagulants: Amiodarone can increase anticoagulant effect. Consider warfarin dose reduction. Patients should be monitored closely and the dose of anticoagulant altered accordingly, remembering that amiodarone levels take several weeks to stabilize.  []  Antiepileptics: Amiodarone can increase plasma concentration of phenytoin, the dose should be reduced. Note that small changes in phenytoin dose can result in large changes in levels. Monitor patient and counsel on signs of toxicity.  [x]  Beta blockers: increased risk of bradycardia, AV block and myocardial depression. Sotalol - avoid concomitant use.  [x]   Calcium channel blockers (diltiazem and verapamil): increased risk of bradycardia, AV block and myocardial depression.  []   Cyclosporine: Amiodarone increases levels of cyclosporine. Reduced dose of cyclosporine is recommended.  [x]  Digoxin dose should be halved when amiodarone is started.  [x]  Diuretics: increased risk of cardiotoxicity if hypokalemia occurs.  []  Oral hypoglycemic agents (glyburide, glipizide, glimepiride): increased risk of hypoglycemia. Patient's glucose levels should be monitored closely when initiating amiodarone therapy.    [x]  Drugs that prolong the QT interval:  Torsades de pointes risk may be increased with concurrent use - avoid if possible.  Monitor QTc, also keep magnesium/potassium WNL if concurrent therapy can't be avoided.   Last QTc 434 ms . Antibiotics: e.g. fluoroquinolones, erythromycin. . Antiarrhythmics: e.g. quinidine, procainamide, disopyramide, sotalol. . Antipsychotics: e.g. phenothiazines, haloperidol.  . Lithium, tricyclic antidepressants, and methadone.  Thank You,  Oswald Hillock , PharmD 11/30/2018 9:00 AM

## 2018-11-30 NOTE — Progress Notes (Signed)
Ambulated 2 loops around station. Pt tolerated it well. Kept up conversation. Gait steady. No discomfort.

## 2018-11-30 NOTE — Progress Notes (Signed)
Progress Note  Patient Name: Melissa Bonilla Date of Encounter: 11/30/2018  Primary Cardiologist: new to [facility] - consult by End  Subjective    On Thursday, June 11 underwent  successful TEE-guided DCCV on 6/97/9480 which was complicated by hypoxia with reversal of anesthesia with flumazenil followed by atrial flutter with 2:1 AV block with a rate of 150 bpm.  given 150 mg bolus of amiodarone with development of hypotension requiring IV fluids and 2 doses of phenylephrine with subsequent improvement in BP.  back in Afib shortly following the procedure  with ventricular rates in the 1-teens to 120s bpm.   Yesterday continued on amiodarone 1 mg/min, digoxin added with mild improvement of rate  Overall she feels much better, SOB resolved, ambulating without sx. Would like to go home of possible   Inpatient Medications    Scheduled Meds: . amiodarone  150 mg Intravenous Once  . cholecalciferol  1,000 Units Oral Daily  . digoxin  0.25 mg Oral Daily  . diltiazem  10 mg Intravenous Once  . furosemide  20 mg Intravenous BID  . metoprolol tartrate  37.5 mg Oral BID  . multivitamin with minerals  1 tablet Oral Daily  . rivaroxaban  20 mg Oral Q supper  . sodium chloride flush  3 mL Intravenous Once  . sodium chloride flush  3 mL Intravenous Q12H  . vitamin B-12  1,000 mcg Oral Daily   Continuous Infusions: . sodium chloride 0 mL/hr at 11/28/18 0009  . amiodarone 60 mg/hr (11/30/18 0427)   PRN Meds: sodium chloride, acetaminophen, ipratropium-albuterol, ondansetron (ZOFRAN) IV, oxyCODONE-acetaminophen, phenol, sodium chloride flush   Vital Signs    Vitals:   11/29/18 2136 11/30/18 0302 11/30/18 0305 11/30/18 0759  BP: 112/81  108/89 (!) 114/98  Pulse:   86 (!) 112  Resp:   20   Temp:   98.7 F (37.1 C) 97.7 F (36.5 C)  TempSrc:   Oral Oral  SpO2:   95% 95%  Weight:  69.5 kg    Height:        Intake/Output Summary (Last 24 hours) at 11/30/2018 1000 Last data filed at  11/30/2018 0800 Gross per 24 hour  Intake 337.27 ml  Output 2250 ml  Net -1912.73 ml   Filed Weights   11/28/18 1955 11/29/18 0314 11/30/18 0302  Weight: 70.4 kg 69.6 kg 69.5 kg    Telemetry    Afib with RVR, 90 to 100  Personally Reviewed  ECG    NSR, 76 bpm, PACs, diffuse TWI - Personally Reviewed  Physical Exam   Constitutional:  oriented to person, place, and time. No distress.  HENT:  Head: Grossly normal Eyes:  no discharge. No scleral icterus.  Neck: No JVD, no carotid bruits  Cardiovascular: Irregularly irregular no murmurs appreciated Pulmonary/Chest: Clear to auscultation bilaterally, no wheezes or rails Abdominal: Soft.  no distension.  no tenderness.  Musculoskeletal: Normal range of motion Neurological:  normal muscle tone. Coordination normal. No atrophy Skin: Skin warm and dry Psychiatric: normal affect, pleasant   Labs    Chemistry Recent Labs  Lab 11/26/18 0003  11/28/18 0436 11/29/18 0431 11/30/18 0432  NA 137   < > 141 138 139  K 4.4   < > 3.6 3.1* 3.8  CL 110   < > 110 107 102  CO2 20*   < > 21* 24 29  GLUCOSE 137*   < > 117* 97 108*  BUN 10   < > 15  19 16  CREATININE 0.66   < > 0.80 0.85 0.91  CALCIUM 7.9*   < > 8.4* 7.9* 8.8*  PROT 6.1*  --   --   --   --   ALBUMIN 3.5  --   --   --   --   AST 38  --   --   --   --   ALT 59*  --   --   --   --   ALKPHOS 80  --   --   --   --   BILITOT 1.2  --   --   --   --   GFRNONAA >60   < > >60 >60 >60  GFRAA >60   < > >60 >60 >60  ANIONGAP 7   < > 10 7 8    < > = values in this interval not displayed.     Hematology Recent Labs  Lab 11/28/18 0436 11/29/18 0431 11/30/18 0432  WBC 7.6 6.3 6.6  RBC 3.98 4.18 4.62  HGB 12.1 12.7 14.1  HCT 36.4 39.0 42.9  MCV 91.5 93.3 92.9  MCH 30.4 30.4 30.5  MCHC 33.2 32.6 32.9  RDW 12.8 12.7 12.4  PLT 154 167 218    Cardiac Enzymes Recent Labs  Lab 11/25/18 1839 11/26/18 1012  TROPONINI <0.03 <0.03   No results for input(s): TROPIPOC in the  last 168 hours.   BNPNo results for input(s): BNP, PROBNP in the last 168 hours.   DDimer No results for input(s): DDIMER in the last 168 hours.   Radiology    Dg Chest 1 View  Result Date: 11/28/2018 IMPRESSION: No acute abnormality noted. Electronically Signed   By: Inez Catalina M.D.   On: 11/28/2018 11:54    Cardiac Studies   Echo (11/26/2018): 1. The left ventricle has moderately reduced systolic function, with an ejection fraction of 35-40%. The cavity size was normal. Left ventricular diastolic function could not be evaluated secondary to atrial fibrillation. Left ventricular diffuse hypokinesis. 2. The right ventricle has normal systolic function. The cavity was normal. There is mildly increased right ventricular wall thickness. 3. Left atrial size was mild-moderately dilated. 4. Right atrial size was mildly dilated. 5. The mitral valve was not well visualized. Mitral valve regurgitation is moderate by color flow Doppler. 6. The tricuspid valve is not well visualized. Tricuspid valve regurgitation is mild-moderate. 7. The aortic valve is tricuspid. Mild thickening of the aortic valve. 8. The aortic root is normal in size and structure. 9. The inferior vena cava was normal in size with <50% respiratory variability. __________  TEE 11/28/2018: 1. The left ventricle has mild-moderately reduced systolic function, with an ejection fraction of 40-45%. The cavity size was normal. Left ventricular diastolic function could not be evaluated.  2. The right ventricle has normal systolc function. The cavity was normal. There is no increase in right ventricular wall thickness.  3. Left atrial size was mildly dilated.  4. The mitral valve is grossly normal.  5. The tricuspid valve was grossly normal.  6. No intracardiac thrombi or masses were visualized.  7. The patient developed uncontrolled cough few minutes after the probe was inserted due to suspected laryngeal spasm. Thus, a  complete study was not performed but I was able to determine that there was no evidence of thrombus.  Patient Profile     73 y.o. female with history of  hyperlipidemia, depression, and remote tobacco use, admitted with atrial fibrillation with rapid ventricular  response in the setting of pneumonia, also found to have moderately reduced LVEF and acute systolic heart failure.  Assessment & Plan    1. Afib with RVR: On Thursday, June 11 underwent  successful TEE-guided DCCV on 0/96/4383 which was complicated by hypoxia with reversal of anesthesia with flumazenil followed by atrial flutter with 2:1 AV block with a rate of 150 bpm.  given 150 mg bolus of amiodarone with development of hypotension requiring IV fluids and 2 doses of phenylephrine with subsequent improvement in BP.  back in Afib shortly following the procedure  with ventricular rates in the 1-teens to 120s bpm.  -CHADS2VASc at least 3 (CHF, age x 1, female) Increase metoprolol up to 50 mg twice daily Xarelto 20 daily Amiodarone oral dosing 400 twice daily, d/co amiodarone infusion Lasix 40 daily oral dosing Could consider d/c later today or tomorrow if rate stable, no sx outpt cardioversion in 2 weeks if tolerating oral meds with controlled symptoms  2. 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 Atypical chest pain:  Consider outpatient work-up if ejection fraction remains low after restoring normal sinus rhythm   Total encounter time more than 35 minutes  Greater than 50% was spent in counseling and coordination of care with the patient    For questions or updates, please contact [facility] Please consult [email or web address] for contact info under Cardiology/STEMI.    Signed, Esmond Plants, MD, Ph.D [facility]

## 2018-12-01 DIAGNOSIS — I420 Dilated cardiomyopathy: Secondary | ICD-10-CM

## 2018-12-01 DIAGNOSIS — R0603 Acute respiratory distress: Secondary | ICD-10-CM

## 2018-12-01 DIAGNOSIS — I428 Other cardiomyopathies: Secondary | ICD-10-CM

## 2018-12-01 LAB — CBC
HCT: 43.5 % (ref 36.0–46.0)
Hemoglobin: 14.2 g/dL (ref 12.0–15.0)
MCH: 29.9 pg (ref 26.0–34.0)
MCHC: 32.6 g/dL (ref 30.0–36.0)
MCV: 91.6 fL (ref 80.0–100.0)
Platelets: 237 10*3/uL (ref 150–400)
RBC: 4.75 MIL/uL (ref 3.87–5.11)
RDW: 12.3 % (ref 11.5–15.5)
WBC: 5.9 10*3/uL (ref 4.0–10.5)
nRBC: 0 % (ref 0.0–0.2)

## 2018-12-01 LAB — CREATININE, SERUM
Creatinine, Ser: 1 mg/dL (ref 0.44–1.00)
GFR calc Af Amer: >60 mL/min (ref >60)
GFR calc non Af Amer: 56 mL/min — ABNORMAL LOW (ref >60)

## 2018-12-01 MED ORDER — APIXABAN 5 MG PO TABS: 5.0000 mg | ORAL_TABLET | Freq: Two times a day (BID) | ORAL | Status: DC

## 2018-12-01 MED ORDER — APIXABAN 5 MG PO TABS: 5.0000 mg | ORAL_TABLET | Freq: Two times a day (BID) | ORAL | 0 refills | Status: DC

## 2018-12-01 MED ORDER — FUROSEMIDE 20 MG PO TABS: 20.0000 mg | ORAL_TABLET | Freq: Two times a day (BID) | ORAL | 1 refills | Status: DC

## 2018-12-01 MED ORDER — AMIODARONE HCL 400 MG PO TABS: 400.0000 mg | ORAL_TABLET | Freq: Two times a day (BID) | ORAL | 0 refills | Status: DC

## 2018-12-01 MED ORDER — DIGOXIN 250 MCG PO TABS: 0.2500 mg | ORAL_TABLET | Freq: Every day | ORAL | 0 refills | Status: DC

## 2018-12-01 MED ORDER — RIVAROXABAN 15 MG PO TABS
15.0000 mg | ORAL_TABLET | Freq: Every day | ORAL | Status: DC
  Filled 2018-12-01: qty 1

## 2018-12-01 MED ORDER — METOPROLOL TARTRATE 50 MG PO TABS: 50.0000 mg | ORAL_TABLET | Freq: Two times a day (BID) | ORAL | 0 refills | Status: DC

## 2018-12-01 MED ORDER — RIVAROXABAN 15 MG PO TABS: 15.0000 mg | ORAL_TABLET | Freq: Every day | ORAL | 0 refills | Status: DC

## 2018-12-01 NOTE — Consult Note (Signed)
  Amiodarone Drug - Drug Interaction Consult Note  Recommendations:  On digoxin- recommend getting level in 5 days. Started on 6/12. Metoprolol tartrate dose increased.    Keep potassium > 4 mmol/L  Follow QTc 6/11 QTc 434.   No new DDI  Amiodarone is metabolized by the cytochrome P450 system and therefore has the potential to cause many drug interactions. Amiodarone has an average plasma half-life of 50 days (range 20 to 100 days).   There is potential for drug interactions to occur several weeks or months after stopping treatment and the onset of drug interactions may be slow after initiating amiodarone.   []  Statins: Increased risk of myopathy. Simvastatin- restrict dose to 20mg  daily. Other statins: counsel patients to report any muscle pain or weakness immediately.  []  Anticoagulants: Amiodarone can increase anticoagulant effect. Consider warfarin dose reduction. Patients should be monitored closely and the dose of anticoagulant altered accordingly, remembering that amiodarone levels take several weeks to stabilize.  []  Antiepileptics: Amiodarone can increase plasma concentration of phenytoin, the dose should be reduced. Note that small changes in phenytoin dose can result in large changes in levels. Monitor patient and counsel on signs of toxicity.  [x]  Beta blockers: increased risk of bradycardia, AV block and myocardial depression. Sotalol - avoid concomitant use.  [x]   Calcium channel blockers (diltiazem and verapamil): increased risk of bradycardia, AV block and myocardial depression.  []   Cyclosporine: Amiodarone increases levels of cyclosporine. Reduced dose of cyclosporine is recommended.  [x]  Digoxin dose should be halved when amiodarone is started.  [x]  Diuretics: increased risk of cardiotoxicity if hypokalemia occurs.  []  Oral hypoglycemic agents (glyburide, glipizide, glimepiride): increased risk of hypoglycemia. Patient's glucose levels should be monitored closely  when initiating amiodarone therapy.   [x]  Drugs that prolong the QT interval:  Torsades de pointes risk may be increased with concurrent use - avoid if possible.  Monitor QTc, also keep magnesium/potassium WNL if concurrent therapy can't be avoided.   Last QTc 434 ms . Antibiotics: e.g. fluoroquinolones, erythromycin. . Antiarrhythmics: e.g. quinidine, procainamide, disopyramide, sotalol. . Antipsychotics: e.g. phenothiazines, haloperidol.  . Lithium, tricyclic antidepressants, and methadone.  Thank You,  Oswald Hillock , PharmD 12/01/2018 9:39 AM

## 2018-12-01 NOTE — Discharge Summary (Signed)
[employer] [facility] [place] at [facility], 73 y.o., DOB 1946-01-08, MRN [id number]. Admission date: 11/25/2018 Discharge Date 12/01/2018 Primary MD Crecencio Mc, MD Admitting Physician Christel Mormon, MD  Admission Diagnosis  SOB (shortness of breath) [R06.02] Atrial fibrillation with RVR ([place]) [I48.91] Chest pain [R07.9] Dyspnea, unspecified type [R06.00]  Discharge Diagnosis   Active Problems: Hypoxic respiratory failure after TEE now resolved A. fib with RVR Right upper lobe community-acquired pneumonia status post treatment Acute systolic CHF Chest pain ruled out for [place]  is a 73 y.o. female with a known history of depression and hyperlipidemia.  She presented to the emergency room complaining of a 24-hour history of shortness of breath with palpitations as well as fevers and chills.  Patient is also experiencing midsternal chest pain described as burning with a pain score 6-10 out of 10.  Patient was evaluated in the ED and was noted to have A. fib with RVR.  Patient CT of the chest was negative.  She was admitted and was seen by cardiology had a cardioversion attempted she converted back to atrial fibrillation.  Post cardioversion patient developed acute hypoxic respiratory failure.  She was treated with oxygen and other supportive care and Lasix with resolution of her respiratory failure.  Cardiology wants to anticoagulate and they will attempt cardioversion as outpatient.  Also noticed to have a new onset systolic dysfunction.            Consults  cardiology  Significant Tests:  See full reports for all details     Dg Chest 1 View  Result Date: 11/28/2018 CLINICAL DATA:  Follow-up aspiration pneumonia EXAM: CHEST  1 VIEW COMPARISON:  11/25/2018 FINDINGS: Cardiac shadow is within normal limits. Lungs are well aerated bilaterally. No sizable effusion is noted. No bony abnormality is seen. IMPRESSION: No acute  abnormality noted. Electronically Signed   By: Inez Catalina M.D.   On: 11/28/2018 11:54   Ct Angio Chest Pe W And/or Wo Contrast  Result Date: 11/25/2018 CLINICAL DATA:  Chest pain and shortness of breath, sore throat EXAM: CT ANGIOGRAPHY CHEST WITH CONTRAST TECHNIQUE: Multidetector CT imaging of the chest was performed using the standard protocol during bolus administration of intravenous contrast. Multiplanar CT image reconstructions and MIPs were obtained to evaluate the vascular anatomy. CONTRAST:  82mL OMNIPAQUE IOHEXOL 350 MG/ML SOLN COMPARISON:  None. FINDINGS: Cardiovascular: Mild four-chamber cardiac enlargement. No significant pericardial effusion. Satisfactory opacification of pulmonary arteries noted, and there is no evidence of pulmonary emboli. Incomplete opacification of the thoracic aorta without any suggestion of dissection or aneurysm. 3 vessel brachiocephalic arterial origin anatomy. Scattered calcified plaque in the arch and descending thoracic segment. Visualized proximal abdominal aorta unremarkable. Mediastinum/Nodes: No hilar or mediastinal adenopathy. Lungs/Pleura: No significant pleural effusion. No pneumothorax. Septal thickening peripherally particularly in the lung bases with somewhat geographic ground-glass opacities with a predominantly perihilar distribution. There is a more focal region of pleural based airspace consolidation in the right upper lobe image 16/6. Upper Abdomen: No acute findings. Musculoskeletal: Minimal spondylitic changes scattered throughout the mid and lower thoracic spine. No fracture or worrisome bone lesion. Review of the MIP images confirms the above findings. IMPRESSION: 1. Negative for acute PE or thoracic aortic dissection. 2. Cardiomegaly with geographic ground-glass opacities and septal thickening suggesting interstitial edema. 3. Focal airspace opacity in the right upper lobe may represent superimposed infectious process versus atypical edema. Consider  follow-up to confirm appropriate resolution.  Electronically Signed   By: Lucrezia Europe M.D.   On: 11/25/2018 21:20   Dg Chest Port 1 View  Result Date: 11/25/2018 CLINICAL DATA:  Chest pain, shortness of breath. EXAM: PORTABLE CHEST 1 VIEW COMPARISON:  Radiographs of May 09, 2011. FINDINGS: Stable cardiomegaly. No pneumothorax or pleural effusion is noted. Both lungs are clear. The visualized skeletal structures are unremarkable. IMPRESSION: No active disease. Electronically Signed   By: Marijo Conception M.D.   On: 11/25/2018 19:22       Today   Subjective:   Melissa Bonilla patient doing much better shortness of breath resolved Objective:   Blood pressure 115/82, pulse 66, temperature 98.6 F (37 C), temperature source Oral, resp. rate 19, height 5' (1.524 m), weight 67.9 kg, SpO2 95 %.  .  Intake/Output Summary (Last 24 hours) at 12/01/2018 1214 Last data filed at 12/01/2018 0600 Gross per 24 hour  Intake 789.94 ml  Output 2000 ml  Net -1210.06 ml    Exam VITAL SIGNS: Blood pressure 115/82, pulse 66, temperature 98.6 F (37 C), temperature source Oral, resp. rate 19, height 5' (1.524 m), weight 67.9 kg, SpO2 95 %.  GENERAL:  73 y.o.-year-old patient lying in the bed with no acute distress.  EYES: Pupils equal, round, reactive to light and accommodation. No scleral icterus. Extraocular muscles intact.  HEENT: Head atraumatic, normocephalic. Oropharynx and nasopharynx clear.  NECK:  Supple, no jugular venous distention. No thyroid enlargement, no tenderness.  LUNGS: Normal breath sounds bilaterally, no wheezing, rales,rhonchi or crepitation. No use of accessory muscles of respiration.  CARDIOVASCULAR: Irregularly irregular normal. No murmurs, rubs, or gallops.  ABDOMEN: Soft, nontender, nondistended. Bowel sounds present. No organomegaly or mass.  EXTREMITIES: No pedal edema, cyanosis, or clubbing.  NEUROLOGIC: Cranial nerves II through XII are intact. Muscle strength 5/5 in all  extremities. Sensation intact. Gait not checked.  PSYCHIATRIC: The patient is alert and oriented x 3.  SKIN: No obvious rash, lesion, or ulcer.   Data Review     CBC w Diff:  Lab Results  Component Value Date   WBC 5.9 12/01/2018   HGB 14.2 12/01/2018   HCT 43.5 12/01/2018   PLT 237 12/01/2018   LYMPHOPCT 14 11/26/2018   MONOPCT 11 11/26/2018   EOSPCT 2 11/26/2018   BASOPCT 0 11/26/2018   CMP:  Lab Results  Component Value Date   NA 139 11/30/2018   K 3.8 11/30/2018   CL 102 11/30/2018   CO2 29 11/30/2018   BUN 16 11/30/2018   CREATININE 1.00 12/01/2018   PROT 6.1 (L) 11/26/2018   ALBUMIN 3.5 11/26/2018   BILITOT 1.2 11/26/2018   ALKPHOS 80 11/26/2018   AST 38 11/26/2018   ALT 59 (H) 11/26/2018  .  Micro Results Recent Results (from the past 240 hour(s))  Blood Culture (routine x 2)     Status: None   Collection Time: 11/25/18  7:17 PM   Specimen: BLOOD  Result Value Ref Range Status   Specimen Description BLOOD BLOOD RIGHT FOREARM  Final   Special Requests   Final    BOTTLES DRAWN AEROBIC AND ANAEROBIC Blood Culture results may not be optimal due to an excessive volume of blood received in culture bottles   Culture   Final    NO GROWTH 5 DAYS Performed at [facility], [address], [place], [place] [address]    Report Status 11/30/2018 FINAL  Final  Blood Culture (routine x 2)     Status: None  Collection Time: 11/25/18  7:28 PM   Specimen: BLOOD  Result Value Ref Range Status   Specimen Description BLOOD RIGHT ANTECUBITAL  Final   Special Requests   Final    BOTTLES DRAWN AEROBIC AND ANAEROBIC Blood Culture results may not be optimal due to an excessive volume of blood received in culture bottles   Culture   Final    NO GROWTH 5 DAYS Performed at [facility], [address] [place], [place] [address]    Report Status 11/30/2018 FINAL  Final  SARS Coronavirus 2 (CEPHEID- Performed in [place] hospital lab), Hosp Order      Status: None   Collection Time: 11/25/18  7:28 PM   Specimen: Nasopharyngeal Swab  Result Value Ref Range Status   SARS Coronavirus 2 NEGATIVE NEGATIVE Final    Comment: (NOTE) If result is NEGATIVE SARS-CoV-2 target nucleic acids are NOT DETECTED. The SARS-CoV-2 RNA is generally detectable in upper and lower  respiratory specimens during the acute phase of infection. The lowest  concentration of SARS-CoV-2 viral copies this assay can detect is 250  copies / mL. A negative result does not preclude SARS-CoV-2 infection  and should not be used as the sole basis for treatment or other  patient management decisions.  A negative result may occur with  improper specimen collection / handling, submission of specimen other  than nasopharyngeal swab, presence of viral mutation(s) within the  areas targeted by this assay, and inadequate number of viral copies  (<250 copies / mL). A negative result must be combined with clinical  observations, patient history, and epidemiological information. If result is POSITIVE SARS-CoV-2 target nucleic acids are DETECTED. The SARS-CoV-2 RNA is generally detectable in upper and lower  respiratory specimens dur ing the acute phase of infection.  Positive  results are indicative of active infection with SARS-CoV-2.  Clinical  correlation with patient history and other diagnostic information is  necessary to determine patient infection status.  Positive results do  not rule out bacterial infection or co-infection with other viruses. If result is PRESUMPTIVE POSTIVE SARS-CoV-2 nucleic acids MAY BE PRESENT.   A presumptive positive result was obtained on the submitted specimen  and confirmed on repeat testing.  While 2019 novel coronavirus  (SARS-CoV-2) nucleic acids may be present in the submitted sample  additional confirmatory testing may be necessary for epidemiological  and / or clinical management purposes  to differentiate between  SARS-CoV-2 and other  Sarbecovirus currently known to infect humans.  If clinically indicated additional testing with an alternate test  methodology [phone]) is advised. The SARS-CoV-2 RNA is generally  detectable in upper and lower respiratory sp ecimens during the acute  phase of infection. The expected result is Negative. Fact Sheet for Patients:  StrictlyIdeas.no Fact Sheet for Healthcare Providers: BankingDealers.co.za This test is not yet approved or cleared by the [place] FDA and has been authorized for detection and/or diagnosis of SARS-CoV-2 by FDA under an Emergency Use Authorization (EUA).  This EUA will remain in effect (meaning this test can be used) for the duration of the COVID-19 declaration under Section 564(b)(1) of the Act, 21 U.S.C. section 360bbb-3(b)(1), unless the authorization is terminated or revoked sooner. Performed at [facility], [address], [place], [place] [address]         Code Status Orders  (From admission, onward)         Start     Ordered   11/26/18 0007  Full code  Continuous  11/26/18 0013        Code Status History    This patient has a current code status but no historical code status.   Advance Care Planning Activity    Advance Directive Documentation     Most Recent Value  Type of Advance Directive  Living will  Pre-existing out of facility DNR order (yellow form or pink MOST form)  -  "MOST" Form in Place?  -          Follow-up Information    Gollan, Kathlene November, MD Follow up in 1 week(s).   Specialty: Cardiology Why: hospt f/u Contact information: [place] [address] [phone]        Crecencio Mc, MD Follow up in 6 day(s).   Specialty: Internal Medicine Why: hosp f/u Contact information: [place] [address] [phone]           Discharge Medications   Allergies as of 12/01/2018       Reactions   Codeine Nausea Only, Rash   Morphine Nausea And Vomiting   Pt allergy may be codeine as codeine or morphine were combined on previous health history. Pt reported upset stomach on questionnaire.   Sulfa Antibiotics Nausea And Vomiting      Medication List    TAKE these medications   amiodarone 400 MG tablet Commonly known as: PACERONE Take 1 tablet (400 mg total) by mouth 2 (two) times daily for 21 days.   apixaban 5 MG Tabs tablet Commonly known as: Eliquis Take 1 tablet (5 mg total) by mouth 2 (two) times daily.   digoxin 0.25 MG tablet Commonly known as: LANOXIN Take 1 tablet (0.25 mg total) by mouth daily.   furosemide 20 MG tablet Commonly known as: LASIX Take 1 tablet (20 mg total) by mouth 2 (two) times daily.   metoprolol tartrate 50 MG tablet Commonly known as: LOPRESSOR Take 1 tablet (50 mg total) by mouth 2 (two) times daily.   multivitamin with minerals Tabs tablet Take 1 tablet by mouth daily.   vitamin B-12 1000 MCG tablet Commonly known as: CYANOCOBALAMIN Take 1,000 mcg by mouth daily.   vitamin C 250 MG tablet Commonly known as: ASCORBIC ACID Take 250 mg by mouth daily.   Vitamin D3 25 MCG (1000 UT) Caps Take 1,000 Units by mouth daily.          Total Time in preparing paper work, data evaluation and todays exam - 103 minutes  Dustin Flock M.D on 12/01/2018 at 12:14 PM [place]  [phone]

## 2018-12-01 NOTE — Progress Notes (Signed)
Pt to be discharged today. Iv's and tele removed. disch instructions and prescrips and elequis coujpon provedie. disch via w.c. husband to transport

## 2018-12-01 NOTE — Progress Notes (Signed)
Progress Note  Patient Name: Melissa Bonilla Date of Encounter: 12/01/2018  Primary Cardiologist: new to [facility] - consult by End  Subjective    On Thursday, June 11 underwent  successful TEE-guided DCCV on 5/80/9983 which was complicated by hypoxia with reversal of anesthesia with flumazenil followed by atrial flutter with 2:1 AV block with a rate of 150 bpm.  given 150 mg bolus of amiodarone with development of hypotension requiring IV fluids and 2 doses of phenylephrine with subsequent improvement in BP.  back in Afib shortly following the procedure  with ventricular rates in the 1-teens to 120s bpm.   Yesterday converted from amiodarone infusion over to oral amiodarone 400 twice daily with digoxin and metoprolol.  Rate well controlled overnight, no symptoms, feels she is at her baseline Would like to go home with outpatient work-up  Inpatient Medications    Scheduled Meds:  amiodarone  400 mg Oral BID   apixaban  5 mg Oral BID   cholecalciferol  1,000 Units Oral Daily   digoxin  0.25 mg Oral Daily   furosemide  20 mg Oral BID   metoprolol tartrate  50 mg Oral BID   multivitamin with minerals  1 tablet Oral Daily   sodium chloride flush  3 mL Intravenous Once   sodium chloride flush  3 mL Intravenous Q12H   vitamin B-12  1,000 mcg Oral Daily   Continuous Infusions:  sodium chloride 0 mL/hr at 11/28/18 0009   PRN Meds: sodium chloride, acetaminophen, ipratropium-albuterol, ondansetron (ZOFRAN) IV, oxyCODONE-acetaminophen, phenol, sodium chloride flush   Vital Signs    Vitals:   12/01/18 0017 12/01/18 0406 12/01/18 0407 12/01/18 0803  BP: 123/85  108/87 115/82  Pulse: 82  90 66  Resp:   20 19  Temp: 97.6 F (36.4 C)  98 F (36.7 C) 98.6 F (37 C)  TempSrc: Oral  Oral Oral  SpO2: 95%  98% 95%  Weight:  67.9 kg    Height:        Intake/Output Summary (Last 24 hours) at 12/01/2018 1213 Last data filed at 12/01/2018 0600 Gross per 24 hour  Intake 789.94  ml  Output 2000 ml  Net -1210.06 ml   Filed Weights   11/29/18 0314 11/30/18 0302 12/01/18 0406  Weight: 69.6 kg 69.5 kg 67.9 kg    Telemetry    Afib with RVR, 70 to 80  Personally Reviewed  ECG    NSR, 76 bpm, PACs, diffuse TWI - Personally Reviewed  Physical Exam   Constitutional:  oriented to person, place, and time. No distress.  HENT:  Head: Grossly normal Eyes:  no discharge. No scleral icterus.  Neck: No JVD, no carotid bruits  Cardiovascular: Irregularly irregular no murmurs appreciated Pulmonary/Chest: Clear to auscultation bilaterally, no wheezes or rails Abdominal: Soft.  no distension.  no tenderness.  Musculoskeletal: Normal range of motion Neurological:  normal muscle tone. Coordination normal. No atrophy Skin: Skin warm and dry Psychiatric: normal affect, pleasant   Labs    Chemistry Recent Labs  Lab 11/26/18 0003  11/28/18 0436 11/29/18 0431 11/30/18 0432 12/01/18 0440  NA 137   < > 141 138 139  --   K 4.4   < > 3.6 3.1* 3.8  --   CL 110   < > 110 107 102  --   CO2 20*   < > 21* 24 29  --   GLUCOSE 137*   < > 117* 97 108*  --  BUN 10   < > 15 19 16   --   CREATININE 0.66   < > 0.80 0.85 0.91 1.00  CALCIUM 7.9*   < > 8.4* 7.9* 8.8*  --   PROT 6.1*  --   --   --   --   --   ALBUMIN 3.5  --   --   --   --   --   AST 38  --   --   --   --   --   ALT 59*  --   --   --   --   --   ALKPHOS 80  --   --   --   --   --   BILITOT 1.2  --   --   --   --   --   GFRNONAA >60   < > >60 >60 >60 56*  GFRAA >60   < > >60 >60 >60 >60  ANIONGAP 7   < > 10 7 8   --    < > = values in this interval not displayed.     Hematology Recent Labs  Lab 11/29/18 0431 11/30/18 0432 12/01/18 0440  WBC 6.3 6.6 5.9  RBC 4.18 4.62 4.75  HGB 12.7 14.1 14.2  HCT 39.0 42.9 43.5  MCV 93.3 92.9 91.6  MCH 30.4 30.5 29.9  MCHC 32.6 32.9 32.6  RDW 12.7 12.4 12.3  PLT 167 218 237    Cardiac Enzymes Recent Labs  Lab 11/25/18 1839 11/26/18 1012  TROPONINI <0.03  <0.03   No results for input(s): TROPIPOC in the last 168 hours.   BNPNo results for input(s): BNP, PROBNP in the last 168 hours.   DDimer No results for input(s): DDIMER in the last 168 hours.   Radiology    Dg Chest 1 View  Result Date: 11/28/2018 IMPRESSION: No acute abnormality noted. Electronically Signed   By: Inez Catalina M.D.   On: 11/28/2018 11:54    Cardiac Studies   Echo (11/26/2018): 1. The left ventricle has moderately reduced systolic function, with an ejection fraction of 35-40%. The cavity size was normal. Left ventricular diastolic function could not be evaluated secondary to atrial fibrillation. Left ventricular diffuse hypokinesis. 2. The right ventricle has normal systolic function. The cavity was normal. There is mildly increased right ventricular wall thickness. 3. Left atrial size was mild-moderately dilated. 4. Right atrial size was mildly dilated. 5. The mitral valve was not well visualized. Mitral valve regurgitation is moderate by color flow Doppler. 6. The tricuspid valve is not well visualized. Tricuspid valve regurgitation is mild-moderate. 7. The aortic valve is tricuspid. Mild thickening of the aortic valve. 8. The aortic root is normal in size and structure. 9. The inferior vena cava was normal in size with <50% respiratory variability. __________  TEE 11/28/2018: 1. The left ventricle has mild-moderately reduced systolic function, with an ejection fraction of 40-45%. The cavity size was normal. Left ventricular diastolic function could not be evaluated.  2. The right ventricle has normal systolc function. The cavity was normal. There is no increase in right ventricular wall thickness.  3. Left atrial size was mildly dilated.  4. The mitral valve is grossly normal.  5. The tricuspid valve was grossly normal.  6. No intracardiac thrombi or masses were visualized.  7. The patient developed uncontrolled cough few minutes after the probe was  inserted due to suspected laryngeal spasm. Thus, a complete study was not performed but I was  able to determine that there was no evidence of thrombus.  Patient Profile     73 y.o. female with history of  hyperlipidemia, depression, and remote tobacco use, admitted with atrial fibrillation with rapid ventricular response in the setting of pneumonia, also found to have moderately reduced LVEF and acute systolic heart failure.  Assessment & Plan    1. Afib with RVR: On Thursday, June 11 underwent  successful TEE-guided DCCV on 09/01/4006 which was complicated by hypoxia with reversal of anesthesia with flumazenil followed by atrial flutter with 2:1 AV block with a rate of 150 bpm.  given 150 mg bolus of amiodarone with development of hypotension requiring IV fluids and 2 doses of phenylephrine with subsequent improvement in BP.  back in Afib shortly following the procedure  with ventricular rates in the 1-teens to 120s bpm.  -CHADS2VASc at least 3 (CHF, age x 1, female) -Would continue metoprolol 50 twice daily, changed to Eliquis 5 twice daily, continue amiodarone 400 twice daily Digoxin 0.25 mg with digoxin level in 3 days outpt cardioversion in 2 weeks if tolerating oral meds with controlled symptoms  2. 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 Atypical chest pain:  Consider outpatient work-up if ejection fraction remains low after restoring normal sinus rhythm  Discharge planning discussed with her, discussed with pharmacy change from Xarelto to Eliquis  Total encounter time more than 35 minutes  Greater than 50% was spent in counseling and coordination of care with the patient    For questions or updates, please contact [place] Please consult [email or web address] for contact info under Cardiology/STEMI.    Signed, Esmond Plants, MD, Ph.D [facility]

## 2018-12-02 ENCOUNTER — Telehealth: Payer: Self-pay

## 2018-12-02 NOTE — Telephone Encounter (Signed)
Attempt to follow up with transitional care management. No answer. Unable to leave voice mail. Virtual hospital follow up tentatively scheduled 6/17 at 11:00. Will follow as appropriate.

## 2018-12-03 ENCOUNTER — Other Ambulatory Visit: Payer: Medicare HMO

## 2018-12-03 ENCOUNTER — Telehealth: Payer: Self-pay | Admitting: Cardiovascular Disease

## 2018-12-03 DIAGNOSIS — Z5181 Encounter for therapeutic drug level monitoring: Secondary | ICD-10-CM

## 2018-12-03 NOTE — Telephone Encounter (Signed)
Transition Care Management Follow-up Telephone Call  How have you been since you were released from the hospital? Patient says she is doing well medication is helping. Denies SOB , chest pain just feels tired.   Do you understand why you were in the hospital? yes   Do you understand the discharge instrcutions? yes  Items Reviewed:  Medications reviewed: yes, Added new  Amiodarone, Eliquis, digoxin, furosemide, metoprolol chart up to date.  Allergies reviewed: yes  Dietary changes reviewed: yes  Referrals reviewed: yes   Functional Questionnaire:   Activities of Daily Living (ADLs):   She states they are independent in the following: ambulation, bathing and hygiene, feeding, continence, grooming, toileting and dressing States they require assistance with the following: Patient says he can handle all adls   Any transportation issues/concerns?: no   Any patient concerns? No   Confirmed importance and date/time of follow-up visits scheduled: yes   Confirmed with patient if condition begins to worsen call PCP or go to the ER.  Patient was given the Call-a-Nurse line [phone]: yes  vist

## 2018-12-03 NOTE — Telephone Encounter (Signed)
Spoke with patient and reviewed that provider requested that she have a digoxin level drawn tomorrow. Reviewed that she would need to go over to [facility] entrance to have that done and check in at the first desk. She verbalized understanding with no further questions at this time.

## 2018-12-04 ENCOUNTER — Other Ambulatory Visit
Admission: RE | Admit: 2018-12-04 | Discharge: 2018-12-04 | Disposition: A | Discharge status: Home / Self Care | Payer: Medicare HMO | Source: Ambulatory Visit | Attending: Cardiovascular Disease | Admitting: Cardiovascular Disease

## 2018-12-04 ENCOUNTER — Encounter: Payer: Self-pay | Admitting: Internal Medicine

## 2018-12-04 ENCOUNTER — Other Ambulatory Visit
Admission: RE | Admit: 2018-12-04 | Discharge: 2018-12-04 | Disposition: A | Discharge status: Home / Self Care | Payer: Medicare HMO | Source: Ambulatory Visit | Attending: Internal Medicine | Admitting: Internal Medicine

## 2018-12-04 ENCOUNTER — Other Ambulatory Visit: Payer: Self-pay

## 2018-12-04 ENCOUNTER — Ambulatory Visit (INDEPENDENT_AMBULATORY_CARE_PROVIDER_SITE_OTHER): Payer: Medicare HMO | Admitting: Internal Medicine

## 2018-12-04 ENCOUNTER — Telehealth: Payer: Self-pay | Admitting: *Deleted

## 2018-12-04 DIAGNOSIS — Z09 Encounter for follow-up examination after completed treatment for conditions other than malignant neoplasm: Secondary | ICD-10-CM

## 2018-12-04 DIAGNOSIS — Z79899 Other long term (current) drug therapy: Secondary | ICD-10-CM | POA: Insufficient documentation

## 2018-12-04 DIAGNOSIS — Z5181 Encounter for therapeutic drug level monitoring: Secondary | ICD-10-CM | POA: Diagnosis not present

## 2018-12-04 DIAGNOSIS — I420 Dilated cardiomyopathy: Secondary | ICD-10-CM

## 2018-12-04 DIAGNOSIS — J189 Pneumonia, unspecified organism: Secondary | ICD-10-CM

## 2018-12-04 DIAGNOSIS — I482 Chronic atrial fibrillation, unspecified: Secondary | ICD-10-CM

## 2018-12-04 LAB — BASIC METABOLIC PANEL
Anion gap: 9 (ref 5–15)
BUN: 15 mg/dL (ref 8–23)
CO2: 31 mmol/L (ref 22–32)
Calcium: 9.2 mg/dL (ref 8.9–10.3)
Chloride: 99 mmol/L (ref 98–111)
Creatinine, Ser: 1.09 mg/dL — ABNORMAL HIGH (ref 0.44–1.00)
GFR calc Af Amer: 59 mL/min — ABNORMAL LOW (ref >60)
GFR calc non Af Amer: 51 mL/min — ABNORMAL LOW (ref >60)
Glucose, Bld: 103 mg/dL — ABNORMAL HIGH (ref 70–99)
Potassium: 4.5 mmol/L (ref 3.5–5.1)
Sodium: 139 mmol/L (ref 135–145)

## 2018-12-04 LAB — DIGOXIN LEVEL: Digoxin Level: 2.2 ng/mL — ABNORMAL HIGH (ref 0.8–2.0)

## 2018-12-04 NOTE — Telephone Encounter (Signed)
No answer. Left message to call back to discuss results and med changes.

## 2018-12-04 NOTE — Telephone Encounter (Signed)
Results called to pt. Pt verbalized understanding of results and to stop digoxin. Med list updated.

## 2018-12-04 NOTE — Telephone Encounter (Signed)
-----   Message from [name], MD sent at 12/04/2018  3:28 PM EDT ----- Elevated digoxin level. She should stop taking Digoxin especially that she is on Amiodarone also.

## 2018-12-04 NOTE — Progress Notes (Addendum)
Virtual Visit via [email or web address]  This visit type was conducted due to national recommendations for restrictions regarding the COVID-19 pandemic (e.g. social distancing).  This format is felt to be most appropriate for this patient at this time.  All issues noted in this document were discussed and addressed.  No physical exam was performed (except for noted visual exam findings with Video Visits).   I connected with@ on 12/04/18 at 11:00 AM EDT by a video enabled telemedicine application  and verified that I am speaking with the correct person using two identifiers. Location patient: home Location provider: work or home office Persons participating in the virtual visit: patient, provider  I discussed the limitations, risks, security and privacy concerns of performing an evaluation and management service by telephone and the availability of in person appointments. I also discussed with the patient that there may be a patient responsible charge related to this service. The patient expressed understanding and agreed to proceed.  Reason for visit: reestablishment of care. Last seen March 2017  HPI:  73 yr old female with no significant PMH  Presented to [facility] June 8 with 24 hour history of fever, chills, palpitations, chest pain,  and dyspnea. She was admitted with hypoxic respiratory failure .  She was found to be in atrial fib with RVR , ruled out for acute MI , COVID 19, and PE, found to have pneumonia and a dilated cardiomyopathy and underwent unsuccessful cardioversion after TEE Nov 28 2018 ruled out atrial thrombus. She was treated for respiratory failure with antibiotics,  Diuretics,  Supplemental oxygen and discharged on "Sunday June 14 with new medications including metoprolol 25 mg bid, amiodarone,  xarelto, twice daily furosemide and digoxin.    She was not advised to weigh herself daily.  She feels generally better,  And notes less dyspnea  with activities.  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  Past Medical History:  Diagnosis Date  . Depression   . Hyperlipidemia   . Menopausal syndrome (hot flashes) 1992   used HRT fo 2 yrs     Past Surgical History:  Procedure Laterality Date  . APPENDECTOMY  1972  . CARDIOVERSION N/A 11/28/2018   Procedure: CARDIOVERSION;  Surgeon: Arida, Muhammad A, MD;  Location: [facility] ORS;  Service: Cardiovascular;  Laterality: N/A;  . CESAREAN SECTION     19" 67, 1972  . TEE WITHOUT CARDIOVERSION N/A 11/28/2018   Procedure: TRANSESOPHAGEAL ECHOCARDIOGRAM (TEE);  Surgeon: [name], MD;  Location: [facility] ORS;  Service: Cardiovascular;  Laterality: N/A;    Family History  Problem Relation Age of Onset  . Arthritis Mother        Rheumatoid  . Heart disease Mother        Valvular  . Hypertension Mother   . Heart disease Father 69       CABG  . Arthritis Maternal Grandmother        Rheumatoid  . Hypertension Maternal Grandmother   . Stroke Maternal Grandmother   . Breast cancer Paternal Aunt 97  . Breast cancer Paternal Aunt 75    SOCIAL HX:  reports that she quit smoking about 28 years ago. Her smoking use included cigarettes. She has a 3.75 pack-year smoking history. She has never used smokeless tobacco. She reports  current alcohol use. She reports that she does not use drugs.   Current Outpatient Medications:  .  amiodarone (PACERONE) 400 MG tablet, Take 1 tablet (400 mg total) by mouth 2 (two) times daily for 21 days., Disp: 42 tablet, Rfl: 0 .  apixaban (ELIQUIS) 5 MG TABS tablet, Take 1 tablet (5 mg total) by mouth 2 (two) times daily., Disp: 60 tablet, Rfl: 0 .  Cholecalciferol (VITAMIN D3) 1000 units CAPS, Take 1,000 Units by mouth daily., Disp: , Rfl:  .  furosemide (LASIX) 20 MG tablet, Take 1 tablet (20 mg total) by mouth 2 (two) times  daily., Disp: 30 tablet, Rfl: 1 .  metoprolol tartrate (LOPRESSOR) 50 MG tablet, Take 1 tablet (50 mg total) by mouth 2 (two) times daily., Disp: 60 tablet, Rfl: 0 .  Multiple Vitamin (MULTIVITAMIN WITH MINERALS) TABS tablet, Take 1 tablet by mouth daily., Disp: , Rfl:  .  vitamin B-12 (CYANOCOBALAMIN) 1000 MCG tablet, Take 1,000 mcg by mouth daily., Disp: , Rfl:  .  vitamin C (ASCORBIC ACID) 250 MG tablet, Take 250 mg by mouth daily., Disp: , Rfl:   EXAM:  VITALS per patient if applicable:  GENERAL: alert, oriented, appears well and in no acute distress  HEENT: atraumatic, conjunttiva clear, no obvious abnormalities on inspection of external nose and ears  NECK: normal movements of the head and neck  LUNGS: on inspection no signs of respiratory distress, breathing rate appears normal, no obvious gross SOB, gasping or wheezing  CV: no obvious cyanosis  MS: moves all visible extremities without noticeable abnormality  PSYCH/NEURO: pleasant and cooperative, no obvious depression or anxiety, speech and thought processing grossly intact  ASSESSMENT AND PLAN:  Chronic atrial fibrillation She is currently rate controlled with amiodarone and metoprolol. Continue xarelto ; plans to repeat cardioversion as an outpatient   Dilated cardiomyopathy ([place]) Etiology unclear may be due to chronic atrial fib sine patinet has been lost to follow up for 3 years.  EF 35 to 40% ,  Sent home on digoxin, metoprolol and furosemide.  Advised to weigh self daily to monitor volume status  and double the lasix dose for overnight weight gain of 2 lbs or weekly weight gain of 5 lbs.   PNA (pneumonia) Repeat chest x ray prior to discharge was negative for infiltrate ,  Blood cultures and urinary strep pneumonia antigen screen were all negative   Hospital discharge follow-up Patient is stable post discharge and has no new issues or questions about discharge plans at the visit today for hospital follow up. All  labs , imaging studies and progress notes from admission were reviewed with patient today      I discussed the assessment and treatment plan with the patient. The patient was provided an opportunity to ask questions and all were answered. The patient agreed with the plan and demonstrated an understanding of the instructions.   The patient was advised to call back or seek an in-person evaluation if the symptoms worsen or if the condition fails to improve as anticipated.  I provided 40 minutes of non-face-to-face time during this encounter.   Crecencio Mc, MD

## 2018-12-07 DIAGNOSIS — I482 Chronic atrial fibrillation, unspecified: Secondary | ICD-10-CM | POA: Insufficient documentation

## 2018-12-07 DIAGNOSIS — Z09 Encounter for follow-up examination after completed treatment for conditions other than malignant neoplasm: Secondary | ICD-10-CM | POA: Insufficient documentation

## 2018-12-07 NOTE — Assessment & Plan Note (Signed)
She is currently rate controlled with amiodarone and metoprolol. Continue xarelto ; plans to repeat cardioversion as an outpatient

## 2018-12-07 NOTE — Assessment & Plan Note (Signed)
Repeat chest x ray prior to discharge was negative for infiltrate ,  Blood cultures and urinary strep pneumonia antigen screen were all negative

## 2018-12-07 NOTE — Assessment & Plan Note (Signed)
Patient is stable post discharge and has no new issues or questions about discharge plans at the visit today for hospital follow up. All labs , imaging studies and progress notes from admission were reviewed with patient today   

## 2018-12-07 NOTE — Assessment & Plan Note (Signed)
Etiology unclear may be due to chronic atrial fib sine patinet has been lost to follow up for 3 years.  EF 35 to 40% ,  Sent home on digoxin, metoprolol and furosemide.  Advised to weigh self daily to monitor volume status  and double the lasix dose for overnight weight gain of 2 lbs or weekly weight gain of 5 lbs.

## 2018-12-16 ENCOUNTER — Encounter: Payer: Self-pay | Admitting: Physician Assistant

## 2018-12-16 NOTE — Progress Notes (Signed)
Cardiology Office Note Date:  12/18/2018  Patient ID:  Melissa Bonilla, Melissa Bonilla 09-04-1945, MRN [id number] PCP:  Crecencio Mc, MD  Cardiologist:  Dr. Saunders Revel, MD    Chief Complaint: Hospital follow-up  History of Present Illness: Melissa Bonilla is a 73 y.o. female with history of recently diagnosed A. fib status post briefly successful cardioversion on 11/28/2018 on Eliquis as detailed below, HFrEF, hypertension, hyperlipidemia, depression, and remote tobacco abuse who presents for hospital follow-up after recent admission to [facility] from 6/9 through 6/14 for A. fib.  Patient presented to the hospital on 6/9 without any previously known cardiac history though did report undergoing a stress test approximately 20 years ago.  She had reported upper chest discomfort that began 2 days prior to her admission and was worse with deep inspiration.  There is also increased shortness of breath.  In the ED, she was noted to be in A. fib with RVR.  CT chest showed bilateral groundglass opacities as well as consolidation in the right upper lobe concerning for pneumonia.  COVID-19 test was negative.  Cardiac enzymes were negative.  Echo on 11/26/2018 showed an EF of 35 to 40%, diffuse LV hypokinesis, normal RV systolic function, mildly to moderately dilated left atrium, mildly dilated right atrium, moderate mitral regurgitation, mild to moderate tricuspid regurgitation, tricuspid aortic valve, normal size and structure of aortic root.  She was gently diuresed and underwent TEE guided cardioversion on 11/28/2018 with TEE showing an EF of 40 to 45%.  Upon insertion of the echo probe the patient developed an uncontrolled cough felt to be secondary to laryngeal spasm leading to inability to perform a complete study though it was able to be determined that there was no evidence of thrombus.  She underwent successful cardioversion on 6/11 and following this procedure she was noted to have frequent PVCs and what seemed to be a run of  atrial flutter with 2-1 AV block with heart rate of 160 bpm and lasted for approximately 1 to 2 minutes.  In this setting, she was given a bolus of IV amiodarone and had a hypotensive episode which was successfully treated with 1 L normal saline bolus and 100 mcg of phenylephrine.  Unfortunately, she was back in A. fib with RVR on 6/12.  She was loaded with amiodarone and started on digoxin for rate control with plans for repeat cardioversion as an outpatient.  Labs: 12/04/2018 - digoxin level 2.2 with recommendation to discontinue digoxin, potassium 4.5, serum creatinine 1.09 12/01/2018 - CBC unremarkable 11/30/2018 - magnesium 1.8 11/26/2018 - TSH normal, LDL 95, AST 38, ALT elevated at 59  She comes in doing well today.  Since her hospital discharge she has not noted any chest pain or palpitations.  Her shortness of breath is stable.  No dizziness, presyncope, or syncope.  She denies any lower extremity swelling, abdominal distention, orthopnea, PND, early satiety.  Her weight has remained stable.  She does note difficulty sleeping since her hospital discharge.  She reports this has previously been an issue for her though seems to be somewhat exacerbated more recently.  She states her husband tells her she snores and she will occasionally wake up gasping for air.  No prior sleep study evaluation.  She denies any falls since her hospital discharge.  No BRBPR or melena.  She has been compliant with all medications including Eliquis without missing any doses.  She feels like when rotating her head from side to side her eyes are lagging a  little behind.  Past Medical History:  Diagnosis Date   Depression    Hyperlipidemia    Menopausal syndrome (hot flashes) 1992   used HRT fo 2 yrs    Persistent atrial fibrillation     Past Surgical History:  Procedure Laterality Date   APPENDECTOMY  1972   CARDIOVERSION N/A 11/28/2018   Procedure: CARDIOVERSION;  Surgeon: [name], MD;  Location:  [facility] ORS;  Service: Cardiovascular;  Laterality: N/A;   [place]   TEE WITHOUT CARDIOVERSION N/A 11/28/2018   Procedure: TRANSESOPHAGEAL ECHOCARDIOGRAM (TEE);  Surgeon: [name], MD;  Location: [facility] ORS;  Service: Cardiovascular;  Laterality: N/A;    Current Meds  Medication Sig   amiodarone (PACERONE) 400 MG tablet Take 1 tablet (400 mg total) by mouth 2 (two) times daily for 21 days.   apixaban (ELIQUIS) 5 MG TABS tablet Take 1 tablet (5 mg total) by mouth 2 (two) times daily.   Cholecalciferol (VITAMIN D3) 1000 units CAPS Take 1,000 Units by mouth daily.   furosemide (LASIX) 20 MG tablet Take 1 tablet (20 mg total) by mouth 2 (two) times daily.   metoprolol tartrate (LOPRESSOR) 50 MG tablet Take 1 tablet (50 mg total) by mouth 2 (two) times daily.   Multiple Vitamin (MULTIVITAMIN WITH MINERALS) TABS tablet Take 1 tablet by mouth daily.   vitamin B-12 (CYANOCOBALAMIN) 1000 MCG tablet Take 1,000 mcg by mouth daily.   vitamin C (ASCORBIC ACID) 250 MG tablet Take 250 mg by mouth daily.    Allergies:   Codeine, Morphine, and Sulfa antibiotics   Social History:  The patient  reports that she quit smoking about 28 years ago. Her smoking use included cigarettes. She has a 3.75 pack-year smoking history. She has never used smokeless tobacco. She reports current alcohol use. She reports that she does not use drugs.   Family History:  The patient's family history includes Arthritis in her maternal grandmother and mother; Breast cancer (age of onset: 65) in her paternal aunt; Breast cancer (age of onset: 81) in her paternal aunt; Heart disease in her mother; Heart disease (age of onset: 51) in her father; Hypertension in her maternal grandmother and mother; Stroke in her maternal grandmother.  ROS:   Review of Systems  Constitutional: Positive for malaise/fatigue. Negative for chills, diaphoresis, fever and weight loss.  HENT: Negative for congestion.    Eyes: Negative for discharge and redness.  Respiratory: Positive for shortness of breath. Negative for cough, hemoptysis, sputum production and wheezing.   Cardiovascular: Negative for chest pain, palpitations, orthopnea, claudication, leg swelling and PND.  Gastrointestinal: Negative for abdominal pain, blood in stool, heartburn, melena, nausea and vomiting.  Genitourinary: Negative for hematuria.  Musculoskeletal: Negative for falls and myalgias.  Skin: Negative for rash.  Neurological: Positive for weakness. Negative for dizziness, tingling, tremors, sensory change, speech change, focal weakness and loss of consciousness.  Endo/Heme/Allergies: Does not bruise/bleed easily.  Psychiatric/Behavioral: Negative for substance abuse. The patient has insomnia. The patient is not nervous/anxious.   All other systems reviewed and are negative.    PHYSICAL EXAM:  VS:  BP 120/80 (BP Location: Left Arm, Patient Position: Sitting, Cuff Size: Normal)    Pulse 93    Temp (!) 97.2 F (36.2 C)    Ht 5' (1.524 m)    Wt 148 lb 8 oz (67.4 kg)    SpO2 97%    BMI 29.00 kg/m  BMI: Body mass index is 29  kg/m.  Physical Exam  Constitutional: She is oriented to person, place, and time. She appears well-developed and well-nourished.  HENT:  Head: Normocephalic and atraumatic.  Eyes: Right eye exhibits no discharge. Left eye exhibits no discharge.  Neck: Normal range of motion. No JVD present.  Cardiovascular: Normal rate, S1 normal and S2 normal. An irregularly irregular rhythm present. Exam reveals no distant heart sounds, no friction rub, no midsystolic click and no opening snap.  Murmur heard. High-pitched blowing holosystolic murmur is present with a grade of 2/6 at the apex. Pulses:      Posterior tibial pulses are 2+ on the right side and 2+ on the left side.  Pulmonary/Chest: Effort normal and breath sounds normal. No respiratory distress. She has no decreased breath sounds. She has no wheezes. She has  no rales. She exhibits no tenderness.  Abdominal: Soft. She exhibits no distension. There is no abdominal tenderness.  Musculoskeletal:        General: No edema.  Neurological: She is alert and oriented to person, place, and time.  Skin: Skin is warm and dry. No cyanosis. Nails show no clubbing.  Psychiatric: She has a normal mood and affect. Her speech is normal and behavior is normal. Judgment and thought content normal.     EKG:  Was ordered and interpreted by me today. Shows A. fib, 93 bpm, poor R wave progression along the precordial leads, nonspecific anterior lateral ST-T changes which are unchanged when compared to EKG from 11/28/2018  Recent Labs: 11/26/2018: ALT 59; TSH 1.224 11/30/2018: Magnesium 1.8 12/01/2018: Hemoglobin 14.2; Platelets 237 12/04/2018: BUN 15; Creatinine, Ser 1.09; Potassium 4.5; Sodium 139  11/26/2018: Cholesterol 145; HDL 39; LDL Cholesterol 95; Total CHOL/HDL Ratio 3.7; Triglycerides 54; VLDL 11   Estimated Creatinine Clearance: 40 mL/min (A) (by C-G formula based on SCr of 1.09 mg/dL (H)).   Wt Readings from Last 3 Encounters:  12/18/18 148 lb 8 oz (67.4 kg)  12/01/18 149 lb 9.6 oz (67.9 kg)  09/02/15 161 lb (73 kg)     Other studies reviewed: Additional studies/records reviewed today include: summarized above  ASSESSMENT AND PLAN:  1. Persistent A. fib/atrial flutter: He remains in A. fib with reasonably controlled ventricular response.  Decrease amiodarone to 200 mg twice daily for 1 week followed by 200 mg daily thereafter.  Continue current dose of Lopressor 50 mg twice daily for rate control.  Continue Eliquis 5 mg twice daily.  She reports compliance with anticoagulation and has not missed any doses.  Plan for repeat cardioversion on 12/23/2018 now that she has been adequately loaded with amiodarone.  She remains off digoxin secondary to elevated level checked post hospitalization.  We will trend this today by checking a digoxin level.  Check CMP and CBC  given recent initiation of anticoagulation.  2. HFrEF: She appears grossly euvolemic and well compensated.  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 fib with RVR.  For now, we will continue her on Lopressor as above for rate control.  However, following repeat cardioversion we should look to transition her to Toprol-XL given her cardiomyopathy.  Plan to repeat echocardiogram approximately 1 month following repeat cardioversion if she maintains sinus rhythm.  Not currently on ACE inhibitor/ARB/Entresto/spironolactone given recent history of hypotension during admission and need for BP room secondary to rate control as needed.  Continue current dose of Lasix.  Following cardioversion, look to optimize evidence-based heart failure therapy with addition of losartan and  transition to Toprol-XL as above.  Look to schedule Lexiscan Myoview later this month after she has been cardioverted.  3. Mitral regurgitation: Asymptomatic.  Monitor with periodic echocardiogram.  4. Sleep disordered breathing/insomnia: Referral to pulmonology for sleep study.  Defer sleep aid to PCP.  5. Elevated LFTs: Check liver function testing.  Disposition: F/u with Dr. Saunders Revel or an APP in 2 weeks.  Current medicines are reviewed at length with the patient today.  The patient did not have any concerns regarding medicines.  Signed, Christell Faith, PA-C 12/18/2018 8:57 AM     [facility] [address] [place] [address] [place], [place] [address] [phone]

## 2018-12-16 NOTE — H&P (View-Only) (Signed)
Cardiology Office Note Date:  12/18/2018  Patient ID:  Melissa Bonilla, Melissa Bonilla 1946/01/09, MRN [id number] PCP:  Crecencio Mc, MD  Cardiologist:  Dr. Saunders Revel, MD    Chief Complaint: Hospital follow-up  History of Present Illness: Melissa Bonilla is a 73 y.o. female with history of recently diagnosed A. fib status post briefly successful cardioversion on 11/28/2018 on Eliquis as detailed below, HFrEF, hypertension, hyperlipidemia, depression, and remote tobacco abuse who presents for hospital follow-up after recent admission to [facility] from 6/9 through 6/14 for A. fib.  Patient presented to the hospital on 6/9 without any previously known cardiac history though did report undergoing a stress test approximately 20 years ago.  She had reported upper chest discomfort that began 2 days prior to her admission and was worse with deep inspiration.  There is also increased shortness of breath.  In the ED, she was noted to be in A. fib with RVR.  CT chest showed bilateral groundglass opacities as well as consolidation in the right upper lobe concerning for pneumonia.  COVID-19 test was negative.  Cardiac enzymes were negative.  Echo on 11/26/2018 showed an EF of 35 to 40%, diffuse LV hypokinesis, normal RV systolic function, mildly to moderately dilated left atrium, mildly dilated right atrium, moderate mitral regurgitation, mild to moderate tricuspid regurgitation, tricuspid aortic valve, normal size and structure of aortic root.  She was gently diuresed and underwent TEE guided cardioversion on 11/28/2018 with TEE showing an EF of 40 to 45%.  Upon insertion of the echo probe the patient developed an uncontrolled cough felt to be secondary to laryngeal spasm leading to inability to perform a complete study though it was able to be determined that there was no evidence of thrombus.  She underwent successful cardioversion on 6/11 and following this procedure she was noted to have frequent PVCs and what seemed to be a run of  atrial flutter with 2-1 AV block with heart rate of 160 bpm and lasted for approximately 1 to 2 minutes.  In this setting, she was given a bolus of IV amiodarone and had a hypotensive episode which was successfully treated with 1 L normal saline bolus and 100 mcg of phenylephrine.  Unfortunately, she was back in A. fib with RVR on 6/12.  She was loaded with amiodarone and started on digoxin for rate control with plans for repeat cardioversion as an outpatient.  Labs: 12/04/2018 - digoxin level 2.2 with recommendation to discontinue digoxin, potassium 4.5, serum creatinine 1.09 12/01/2018 - CBC unremarkable 11/30/2018 - magnesium 1.8 11/26/2018 - TSH normal, LDL 95, AST 38, ALT elevated at 59  She comes in doing well today.  Since her hospital discharge she has not noted any chest pain or palpitations.  Her shortness of breath is stable.  No dizziness, presyncope, or syncope.  She denies any lower extremity swelling, abdominal distention, orthopnea, PND, early satiety.  Her weight has remained stable.  She does note difficulty sleeping since her hospital discharge.  She reports this has previously been an issue for her though seems to be somewhat exacerbated more recently.  She states her husband tells her she snores and she will occasionally wake up gasping for air.  No prior sleep study evaluation.  She denies any falls since her hospital discharge.  No BRBPR or melena.  She has been compliant with all medications including Eliquis without missing any doses.  She feels like when rotating her head from side to side her eyes are lagging a  little behind.  Past Medical History:  Diagnosis Date   Depression    Hyperlipidemia    Menopausal syndrome (hot flashes) 1992   used HRT fo 2 yrs    Persistent atrial fibrillation     Past Surgical History:  Procedure Laterality Date   APPENDECTOMY  1972   CARDIOVERSION N/A 11/28/2018   Procedure: CARDIOVERSION;  Surgeon: [name], MD;  Location:  [facility] ORS;  Service: Cardiovascular;  Laterality: N/A;   [place]   TEE WITHOUT CARDIOVERSION N/A 11/28/2018   Procedure: TRANSESOPHAGEAL ECHOCARDIOGRAM (TEE);  Surgeon: [name], MD;  Location: [facility] ORS;  Service: Cardiovascular;  Laterality: N/A;    Current Meds  Medication Sig   amiodarone (PACERONE) 400 MG tablet Take 1 tablet (400 mg total) by mouth 2 (two) times daily for 21 days.   apixaban (ELIQUIS) 5 MG TABS tablet Take 1 tablet (5 mg total) by mouth 2 (two) times daily.   Cholecalciferol (VITAMIN D3) 1000 units CAPS Take 1,000 Units by mouth daily.   furosemide (LASIX) 20 MG tablet Take 1 tablet (20 mg total) by mouth 2 (two) times daily.   metoprolol tartrate (LOPRESSOR) 50 MG tablet Take 1 tablet (50 mg total) by mouth 2 (two) times daily.   Multiple Vitamin (MULTIVITAMIN WITH MINERALS) TABS tablet Take 1 tablet by mouth daily.   vitamin B-12 (CYANOCOBALAMIN) 1000 MCG tablet Take 1,000 mcg by mouth daily.   vitamin C (ASCORBIC ACID) 250 MG tablet Take 250 mg by mouth daily.    Allergies:   Codeine, Morphine, and Sulfa antibiotics   Social History:  The patient  reports that she quit smoking about 28 years ago. Her smoking use included cigarettes. She has a 3.75 pack-year smoking history. She has never used smokeless tobacco. She reports current alcohol use. She reports that she does not use drugs.   Family History:  The patient's family history includes Arthritis in her maternal grandmother and mother; Breast cancer (age of onset: 41) in her paternal aunt; Breast cancer (age of onset: 56) in her paternal aunt; Heart disease in her mother; Heart disease (age of onset: 24) in her father; Hypertension in her maternal grandmother and mother; Stroke in her maternal grandmother.  ROS:   Review of Systems  Constitutional: Positive for malaise/fatigue. Negative for chills, diaphoresis, fever and weight loss.  HENT: Negative for congestion.     Eyes: Negative for discharge and redness.  Respiratory: Positive for shortness of breath. Negative for cough, hemoptysis, sputum production and wheezing.   Cardiovascular: Negative for chest pain, palpitations, orthopnea, claudication, leg swelling and PND.  Gastrointestinal: Negative for abdominal pain, blood in stool, heartburn, melena, nausea and vomiting.  Genitourinary: Negative for hematuria.  Musculoskeletal: Negative for falls and myalgias.  Skin: Negative for rash.  Neurological: Positive for weakness. Negative for dizziness, tingling, tremors, sensory change, speech change, focal weakness and loss of consciousness.  Endo/Heme/Allergies: Does not bruise/bleed easily.  Psychiatric/Behavioral: Negative for substance abuse. The patient has insomnia. The patient is not nervous/anxious.   All other systems reviewed and are negative.    PHYSICAL EXAM:  VS:  BP 120/80 (BP Location: Left Arm, Patient Position: Sitting, Cuff Size: Normal)    Pulse 93    Temp (!) 97.2 F (36.2 C)    Ht 5' (1.524 m)    Wt 148 lb 8 oz (67.4 kg)    SpO2 97%    BMI 29.00 kg/m  BMI: Body mass index is  29 kg/m.  Physical Exam  Constitutional: She is oriented to person, place, and time. She appears well-developed and well-nourished.  HENT:  Head: Normocephalic and atraumatic.  Eyes: Right eye exhibits no discharge. Left eye exhibits no discharge.  Neck: Normal range of motion. No JVD present.  Cardiovascular: Normal rate, S1 normal and S2 normal. An irregularly irregular rhythm present. Exam reveals no distant heart sounds, no friction rub, no midsystolic click and no opening snap.  Murmur heard. High-pitched blowing holosystolic murmur is present with a grade of 2/6 at the apex. Pulses:      Posterior tibial pulses are 2+ on the right side and 2+ on the left side.  Pulmonary/Chest: Effort normal and breath sounds normal. No respiratory distress. She has no decreased breath sounds. She has no wheezes. She has  no rales. She exhibits no tenderness.  Abdominal: Soft. She exhibits no distension. There is no abdominal tenderness.  Musculoskeletal:        General: No edema.  Neurological: She is alert and oriented to person, place, and time.  Skin: Skin is warm and dry. No cyanosis. Nails show no clubbing.  Psychiatric: She has a normal mood and affect. Her speech is normal and behavior is normal. Judgment and thought content normal.     EKG:  Was ordered and interpreted by me today. Shows A. fib, 93 bpm, poor R wave progression along the precordial leads, nonspecific anterior lateral ST-T changes which are unchanged when compared to EKG from 11/28/2018  Recent Labs: 11/26/2018: ALT 59; TSH 1.224 11/30/2018: Magnesium 1.8 12/01/2018: Hemoglobin 14.2; Platelets 237 12/04/2018: BUN 15; Creatinine, Ser 1.09; Potassium 4.5; Sodium 139  11/26/2018: Cholesterol 145; HDL 39; LDL Cholesterol 95; Total CHOL/HDL Ratio 3.7; Triglycerides 54; VLDL 11   Estimated Creatinine Clearance: 40 mL/min (A) (by C-G formula based on SCr of 1.09 mg/dL (H)).   Wt Readings from Last 3 Encounters:  12/18/18 148 lb 8 oz (67.4 kg)  12/01/18 149 lb 9.6 oz (67.9 kg)  09/02/15 161 lb (73 kg)     Other studies reviewed: Additional studies/records reviewed today include: summarized above  ASSESSMENT AND PLAN:  1. Persistent A. fib/atrial flutter: He remains in A. fib with reasonably controlled ventricular response.  Decrease amiodarone to 200 mg twice daily for 1 week followed by 200 mg daily thereafter.  Continue current dose of Lopressor 50 mg twice daily for rate control.  Continue Eliquis 5 mg twice daily.  She reports compliance with anticoagulation and has not missed any doses.  Plan for repeat cardioversion on 12/23/2018 now that she has been adequately loaded with amiodarone.  She remains off digoxin secondary to elevated level checked post hospitalization.  We will trend this today by checking a digoxin level.  Check CMP and CBC  given recent initiation of anticoagulation.  2. HFrEF: She appears grossly euvolemic and well compensated.  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 fib with RVR.  For now, we will continue her on Lopressor as above for rate control.  However, following repeat cardioversion we should look to transition her to Toprol-XL given her cardiomyopathy.  Plan to repeat echocardiogram approximately 1 month following repeat cardioversion if she maintains sinus rhythm.  Not currently on ACE inhibitor/ARB/Entresto/spironolactone given recent history of hypotension during admission and need for BP room secondary to rate control as needed.  Continue current dose of Lasix.  Following cardioversion, look to optimize evidence-based heart failure therapy with addition of losartan  and transition to Toprol-XL as above.  Look to schedule Lexiscan Myoview later this month after she has been cardioverted.  3. Mitral regurgitation: Asymptomatic.  Monitor with periodic echocardiogram.  4. Sleep disordered breathing/insomnia: Referral to pulmonology for sleep study.  Defer sleep aid to PCP.  5. Elevated LFTs: Check liver function testing.  Disposition: F/u with Dr. Saunders Revel or an APP in 2 weeks.  Current medicines are reviewed at length with the patient today.  The patient did not have any concerns regarding medicines.  Signed, Christell Faith, PA-C 12/18/2018 8:57 AM     [facility] [address] [place] [address] [place], [place] [address] [phone]

## 2018-12-17 ENCOUNTER — Telehealth: Payer: Self-pay

## 2018-12-17 NOTE — Telephone Encounter (Signed)
    COVID-19 Pre-Screening Questions:  . In the past 7 to 10 days have you had a cough, shortness of breath, headache, congestion, fever (100 or greater), body aches, chills, sore throat, or sudden loss of taste or sense of smell? NO . Have you been around anyone with known Covid 19? NO . Have you been around anyone who is awaiting Covid 19 test results in the past 7 to 10 days? NO . Have you been around anyone who has been exposed to Covid 19, or has mentioned symptoms of Covid 19 within the past 7 to 10 days? NO  If you have any concerns/questions about symptoms patients report during screening (either on the phone or at threshold). Contact the provider seeing the patient or DOD for further guidance.  If neither are available contact a member of the leadership team.            

## 2018-12-18 ENCOUNTER — Other Ambulatory Visit: Payer: Self-pay

## 2018-12-18 ENCOUNTER — Encounter: Payer: Self-pay | Admitting: Physician Assistant

## 2018-12-18 ENCOUNTER — Telehealth: Payer: Self-pay | Admitting: Internal Medicine

## 2018-12-18 ENCOUNTER — Ambulatory Visit (INDEPENDENT_AMBULATORY_CARE_PROVIDER_SITE_OTHER): Payer: Medicare HMO | Admitting: Physician Assistant

## 2018-12-18 ENCOUNTER — Other Ambulatory Visit: Payer: Self-pay | Admitting: Physician Assistant

## 2018-12-18 VITALS — BP 120/80 | HR 93 | Temp 97.2°F | Ht 60.0 in | Wt 148.5 lb

## 2018-12-18 DIAGNOSIS — I4819 Other persistent atrial fibrillation: Secondary | ICD-10-CM

## 2018-12-18 DIAGNOSIS — R945 Abnormal results of liver function studies: Secondary | ICD-10-CM

## 2018-12-18 DIAGNOSIS — G4700 Insomnia, unspecified: Secondary | ICD-10-CM | POA: Diagnosis not present

## 2018-12-18 DIAGNOSIS — G4730 Sleep apnea, unspecified: Secondary | ICD-10-CM | POA: Diagnosis not present

## 2018-12-18 DIAGNOSIS — I5022 Chronic systolic (congestive) heart failure: Secondary | ICD-10-CM | POA: Diagnosis not present

## 2018-12-18 DIAGNOSIS — I340 Nonrheumatic mitral (valve) insufficiency: Secondary | ICD-10-CM | POA: Diagnosis not present

## 2018-12-18 DIAGNOSIS — I482 Chronic atrial fibrillation, unspecified: Secondary | ICD-10-CM

## 2018-12-18 DIAGNOSIS — Z8679 Personal history of other diseases of the circulatory system: Secondary | ICD-10-CM | POA: Insufficient documentation

## 2018-12-18 DIAGNOSIS — R7989 Other specified abnormal findings of blood chemistry: Secondary | ICD-10-CM

## 2018-12-18 MED ORDER — ESCITALOPRAM OXALATE 10 MG PO TABS: 10.0000 mg | ORAL_TABLET | Freq: Every day | ORAL | 1 refills | Status: DC

## 2018-12-18 MED ORDER — METOPROLOL TARTRATE 50 MG PO TABS: 50.0000 mg | ORAL_TABLET | Freq: Two times a day (BID) | ORAL | 0 refills | Status: DC

## 2018-12-18 MED ORDER — FUROSEMIDE 20 MG PO TABS: 20.0000 mg | ORAL_TABLET | Freq: Two times a day (BID) | ORAL | 3 refills | Status: DC

## 2018-12-18 MED ORDER — AMIODARONE HCL 200 MG PO TABS: 200.0000 mg | ORAL_TABLET | Freq: Every day | ORAL | 3 refills | Status: DC

## 2018-12-18 MED ORDER — APIXABAN 5 MG PO TABS: 5.0000 mg | ORAL_TABLET | Freq: Two times a day (BID) | ORAL | 0 refills | Status: DC

## 2018-12-18 NOTE — Telephone Encounter (Signed)
No she was just seen,  rx for 10 mg dose sent to  [facility], start with 1/2 tablet daily at dinner time,  increase to 10 mg full tablet after one week.    Follow up one month

## 2018-12-18 NOTE — Telephone Encounter (Signed)
Pt called to see if Dr. Derrel Nip would put her back on Lexipro for her nerves. Pt stated that 20mg  maybe to strong due to it making her sleepy in the past and may have been stronger than she needed/ please advise

## 2018-12-18 NOTE — Telephone Encounter (Signed)
Does pt need to schedule a virtual/telephone visit to discuss the need for medication.

## 2018-12-18 NOTE — Patient Instructions (Addendum)
Medication Instructions:  Your physician has recommended you make the following change in your medication:  1- DECREASE Amiodarone to 1 tablet (200 mg total) twice daily for 1 week, then take 1 tablet (200 mg total) once daily thereafter.   If you need a refill on your cardiac medications before your next appointment, please call your pharmacy.   Lab work: Your physician recommends that you have lab work today(CBC, CMET, digoxin level)  If you have labs (blood work) drawn today and your tests are completely normal, you will receive your results only by: Marland Kitchen MyChart Message (if you have MyChart) OR . A paper copy in the mail If you have any lab test that is abnormal or we need to change your treatment, we will call you to review the results.  Testing/Procedures: 1- Cardioversion  Date: 12/23/18  Your physician has recommended that you have a Cardioversion (DCCV). Electrical Cardioversion uses a jolt of electricity to your heart either through paddles or wired patches attached to your chest. This is a controlled, usually prescheduled, procedure. Defibrillation is done under light anesthesia in the hospital, and you usually go home the day of the procedure. This is done to get your heart back into a normal rhythm. You are not awake for the procedure. Please see the instruction sheet given to you today.  You are scheduled for a Cardioversion with Dr. Fletcher Anon.  Please arrive at the medical mall at 6:30 am. (1 hour prior to procedure)  DIET: Nothing to eat or drink after midnight except a sip of water with medications (see medication instructions below)  Medication Instructions: Hold Lasix  Continue your anticoagulant: Eliquis You will need to continue your anticoagulant after your procedure until you are told by your  Provider that it is safe to stop   Labs: Done today You must have a responsible person to drive you home and stay in the waiting area during your procedure. Failure to do so could  result in cancellation.  Bring your insurance cards.  *Special Note: Every effort is made to have your procedure done on time. Occasionally there are emergencies that occur at the hospital that may cause delays. Please be patient if a delay does occur.    Follow-Up: At [facility], you and your health needs are our priority.  As part of our continuing mission to provide you with exceptional heart care, we have created designated Provider Care Teams.  These Care Teams include your primary Cardiologist (physician) and Advanced Practice Providers (APPs -  Physician Assistants and Nurse Practitioners) who all work together to provide you with the care you need, when you need it. You will need a follow up appointment in 2 weeks. You may see Dr. Saunders Revel or Christell Faith, PA-C.  Any Other Special Instructions Will Be Listed Below (If Applicable). *Ref to Pulmonology  Covid testing tomorrow [facility]/19/18 [facility] from 12:30-2:30.

## 2018-12-19 ENCOUNTER — Other Ambulatory Visit
Admission: RE | Admit: 2018-12-19 | Discharge: 2018-12-19 | Disposition: A | Discharge status: Home / Self Care | Payer: Medicare HMO | Source: Ambulatory Visit | Attending: Physician Assistant | Admitting: Physician Assistant

## 2018-12-19 ENCOUNTER — Telehealth: Payer: Self-pay | Admitting: Cardiovascular Disease

## 2018-12-19 DIAGNOSIS — Z01812 Encounter for preprocedural laboratory examination: Secondary | ICD-10-CM | POA: Insufficient documentation

## 2018-12-19 DIAGNOSIS — Z1159 Encounter for screening for other viral diseases: Secondary | ICD-10-CM | POA: Diagnosis not present

## 2018-12-19 LAB — COMPREHENSIVE METABOLIC PANEL
ALT: 25 IU/L (ref 0–32)
AST: 26 IU/L (ref 0–40)
Albumin/Globulin Ratio: 2 (ref 1.2–2.2)
Albumin: 4.1 g/dL (ref 3.7–4.7)
Alkaline Phosphatase: 101 IU/L (ref 39–117)
BUN/Creatinine Ratio: 15 (ref 12–28)
BUN: 14 mg/dL (ref 8–27)
Bilirubin Total: 0.4 mg/dL (ref 0.0–1.2)
CO2: 23 mmol/L (ref 20–29)
Calcium: 8.7 mg/dL (ref 8.7–10.3)
Chloride: 105 mmol/L (ref 96–106)
Creatinine, Ser: 0.91 mg/dL (ref 0.57–1.00)
GFR calc Af Amer: 73 mL/min/{1.73_m2} (ref >59)
GFR calc non Af Amer: 63 mL/min/{1.73_m2} (ref >59)
Globulin, Total: 2.1 g/dL (ref 1.5–4.5)
Glucose: 102 mg/dL — ABNORMAL HIGH (ref 65–99)
Potassium: 4.5 mmol/L (ref 3.5–5.2)
Sodium: 140 mmol/L (ref 134–144)
Total Protein: 6.2 g/dL (ref 6.0–8.5)

## 2018-12-19 LAB — CBC
Hematocrit: 44.1 % (ref 34.0–46.6)
Hemoglobin: 14.7 g/dL (ref 11.1–15.9)
MCH: 30.5 pg (ref 26.6–33.0)
MCHC: 33.3 g/dL (ref 31.5–35.7)
MCV: 92 fL (ref 79–97)
Platelets: 203 10*3/uL (ref 150–450)
RBC: 4.82 x10E6/uL (ref 3.77–5.28)
RDW: 12.6 % (ref 11.7–15.4)
WBC: 6.5 10*3/uL (ref 3.4–10.8)

## 2018-12-19 LAB — DIGOXIN LEVEL: Digoxin, Serum: <0.4 ng/mL — ABNORMAL LOW (ref 0.5–0.9)

## 2018-12-19 LAB — SARS CORONAVIRUS 2 (TAT 6-24 HRS): SARS Coronavirus 2: NEGATIVE

## 2018-12-19 NOTE — Telephone Encounter (Signed)
Patient returning call for lab results  Please call to discuss

## 2018-12-19 NOTE — Telephone Encounter (Signed)
Spoke with the pt. Pt made aware of lab results and Christell Faith, PA recommendation. Pt verbalized understanding and voiced appreciation for the call.

## 2018-12-19 NOTE — Telephone Encounter (Signed)
Pt returned call

## 2018-12-19 NOTE — Telephone Encounter (Signed)
LMTCB

## 2018-12-19 NOTE — Telephone Encounter (Signed)
Spoke with pt and informed her that Dr. Derrel Nip did send in Lexapro 10mg  for her to start taking. Explained to pt that she will need to start with 1/2 tablet daily at bedtime for one week and then increase to a full tablet. Pt gave a verbal understanding. One month follow up has been scheduled and pt is aware of appt date and time.

## 2018-12-20 ENCOUNTER — Telehealth: Payer: Self-pay | Admitting: Physician Assistant

## 2018-12-20 NOTE — Telephone Encounter (Signed)
   Patient called after hours answering service with question about HR. She checked her HR this AM on BP cuff and it was 45, with BP 125/59. She is feeling a little tired today but generally well without any acute dizziness, syncope, pre-syncope, CP, SOB or palpitations. I had her manually check her pulse while on the phone and it was 60bpm. She thinks it felt regular but wasn't totally confident of this. She already took AM meds today. Since she is feeling well, I asked her to hold her PM dose of metoprolol and check HR in AM. If HR is >60, I told her to restart metoprolol at 1/2 dose (25mg  BID) and continue to follow. I told her to call the answering service back tomorrow AM if HR is <60 as she may require more intensive adjustment of her amio and metoprolol depending on that value and her BP. She has a DCCV scheduled on 7/6. Given the HR change I wonder if she converted to NSR.  She has no at-home monitoring APPs otherwise. I discussed with Christell Faith who affirms she should keep this scheduled as it's scheduled too early to have her come into the office for an EKG beforehand. The patient verbalized understanding and gratitude.  Maxie Slovacek PA-C

## 2018-12-20 NOTE — Telephone Encounter (Signed)
Agree. Thanks

## 2018-12-22 ENCOUNTER — Encounter: Payer: Self-pay | Admitting: Certified Registered Nurse Anesthetist

## 2018-12-23 ENCOUNTER — Other Ambulatory Visit: Payer: Self-pay

## 2018-12-23 ENCOUNTER — Telehealth: Payer: Self-pay | Admitting: *Deleted

## 2018-12-23 ENCOUNTER — Encounter: Payer: Self-pay | Admitting: Emergency Medicine

## 2018-12-23 ENCOUNTER — Encounter
Admission: RE | Disposition: A | Discharge status: Home / Self Care | Payer: Self-pay | Source: Home / Self Care | Attending: Cardiovascular Disease

## 2018-12-23 ENCOUNTER — Ambulatory Visit
Admission: RE | Admit: 2018-12-23 | Discharge: 2018-12-23 | Disposition: A | Discharge status: Home / Self Care | Payer: Medicare HMO | Attending: Cardiovascular Disease | Admitting: Cardiovascular Disease

## 2018-12-23 DIAGNOSIS — I5022 Chronic systolic (congestive) heart failure: Secondary | ICD-10-CM | POA: Diagnosis not present

## 2018-12-23 DIAGNOSIS — Z87891 Personal history of nicotine dependence: Secondary | ICD-10-CM | POA: Insufficient documentation

## 2018-12-23 DIAGNOSIS — Z7901 Long term (current) use of anticoagulants: Secondary | ICD-10-CM | POA: Diagnosis not present

## 2018-12-23 DIAGNOSIS — I429 Cardiomyopathy, unspecified: Secondary | ICD-10-CM | POA: Insufficient documentation

## 2018-12-23 DIAGNOSIS — G4700 Insomnia, unspecified: Secondary | ICD-10-CM | POA: Insufficient documentation

## 2018-12-23 DIAGNOSIS — I340 Nonrheumatic mitral (valve) insufficiency: Secondary | ICD-10-CM | POA: Diagnosis not present

## 2018-12-23 DIAGNOSIS — Z79899 Other long term (current) drug therapy: Secondary | ICD-10-CM | POA: Insufficient documentation

## 2018-12-23 DIAGNOSIS — Z8249 Family history of ischemic heart disease and other diseases of the circulatory system: Secondary | ICD-10-CM | POA: Diagnosis not present

## 2018-12-23 DIAGNOSIS — I110 Hypertensive heart disease with heart failure: Secondary | ICD-10-CM | POA: Insufficient documentation

## 2018-12-23 DIAGNOSIS — Z538 Procedure and treatment not carried out for other reasons: Secondary | ICD-10-CM | POA: Insufficient documentation

## 2018-12-23 DIAGNOSIS — R7989 Other specified abnormal findings of blood chemistry: Secondary | ICD-10-CM | POA: Insufficient documentation

## 2018-12-23 DIAGNOSIS — I4891 Unspecified atrial fibrillation: Secondary | ICD-10-CM | POA: Diagnosis present

## 2018-12-23 DIAGNOSIS — I4819 Other persistent atrial fibrillation: Secondary | ICD-10-CM | POA: Diagnosis not present

## 2018-12-23 DIAGNOSIS — Z8679 Personal history of other diseases of the circulatory system: Secondary | ICD-10-CM | POA: Insufficient documentation

## 2018-12-23 HISTORY — PX: CARDIOVERSION: SHX1299

## 2018-12-23 SURGERY — CARDIOVERSION
Anesthesia: General

## 2018-12-23 NOTE — Telephone Encounter (Signed)
-----   Message from [name], MD sent at 12/23/2018  1:37 PM EDT ----- This patient came for cardioversion today but was noted to be in sinus bradycardia.  She was sent home and I did not see her. Given her bradycardia, please call the patient and ask her to hold metoprolol and continue amiodarone 200 mg once daily.   She already has a follow-up appointment scheduled with Thurmond Butts.

## 2018-12-23 NOTE — Progress Notes (Signed)
EKG performed per MD order, pt noted to be in sinus brady with visible P-waves.  Dr. Fletcher Anon informed.  Per Dr. Fletcher Anon, cancel electrical cardioversion and discharge pt.  Pt instructed if symptoms of afib occur to call Dr. Tyrell Antonio office.  Pt verbalizes understanding.  Pt leaving unit, ambulatory in NAD.

## 2018-12-23 NOTE — Interval H&P Note (Signed)
History and Physical Interval Note:  12/23/2018 1:40 PM  Melissa Bonilla  has presented today for surgery, with the diagnosis of Cardioversion   Afib.  However, she was noted to be in sinus bradycardia and thus cardioversion was not needed.  The patient will be instructed to hold metoprolol and continue amiodarone 200 mg once daily.   Kathlyn Sacramento

## 2018-12-23 NOTE — Telephone Encounter (Signed)
Called patient and she verbalized understanding to stop metoprolol and continue the amiodarone 200 mg once a day. She is aware of her upcoming appointment date and time with Thurmond Butts next week.

## 2018-12-24 ENCOUNTER — Encounter: Payer: Self-pay | Admitting: Cardiovascular Disease

## 2018-12-26 ENCOUNTER — Telehealth: Payer: Self-pay | Admitting: Internal Medicine

## 2018-12-26 NOTE — Telephone Encounter (Signed)
Dr. Derrel Nip is aware and has signed the form to be faxed back. Form has been faxed.

## 2018-12-26 NOTE — Telephone Encounter (Signed)
Pt said [facility] contacted her and told her generic lexapro medication can cause some issues with her other medication. Pt did not ask them which medication. Please contact [facility] [phone]

## 2018-12-30 NOTE — Progress Notes (Signed)
Patient ID: Melissa Bonilla, female    DOB: 05-Aug-1945, 73 y.o.   MRN: [id number]  HPI  Melissa Bonilla is a 73 y/o female with a history of atrial fibrillation, hyperlipidemia, obstructive sleep apnea, depression, anxiety and chronic heart failure.   Echo report from 11/28/2018 reviewed and showed an EF of 40-45%.  Admitted 11/25/2018 due to atrial fibrillation. Cardiology consult obtained.CT was negative. Developed respiratory distress after cardioversion. Given oxygen and lasix. Discharged after 6 days.   Patient presents for her initial visit with a chief complaint of intermittent palpitations. She describes these as chronic in nature having been present for several months. She has associated anxiety along with this. She denies any difficulty sleeping, dizziness, abdominal distention, pedal edema, chest pain, shortness of breath, cough, fatigue or weight gain.   Past Medical History:  Diagnosis Date  . Arrhythmia    atrial fibrillation  . CHF (congestive heart failure) ([place])   . Depression   . Hyperlipidemia   . Menopausal syndrome (hot flashes) 1992   used HRT fo 2 yrs   . Obstructive sleep apnea   . Persistent atrial fibrillation    Past Surgical History:  Procedure Laterality Date  . APPENDECTOMY  1972  . CARDIOVERSION N/A 11/28/2018   Procedure: CARDIOVERSION;  Surgeon: [name], MD;  Location: [facility] ORS;  Service: Cardiovascular;  Laterality: N/A;  . CARDIOVERSION N/A 12/23/2018   Procedure: CARDIOVERSION;  Surgeon: [name], MD;  Location: [facility] ORS;  Service: Cardiovascular;  Laterality: N/A;  . [place]  . TEE WITHOUT CARDIOVERSION N/A 11/28/2018   Procedure: TRANSESOPHAGEAL ECHOCARDIOGRAM (TEE);  Surgeon: [name], MD;  Location: [facility] ORS;  Service: Cardiovascular;  Laterality: N/A;   Family History  Problem Relation Age of Onset  . Arthritis Mother        Rheumatoid  . Heart disease Mother        Valvular  . Hypertension  Mother   . Heart disease Father 46       CABG  . Arthritis Maternal Grandmother        Rheumatoid  . Hypertension Maternal Grandmother   . Stroke Maternal Grandmother   . Breast cancer Paternal Aunt 38  . Breast cancer Paternal Aunt 66   Social History   Tobacco Use  . Smoking status: Former Smoker    Packs/day: 0.25    Years: 15.00    Pack years: 3.75    Types: Cigarettes    Quit date: 1992    Years since quitting: 28.5  . Smokeless tobacco: Never Used  Substance Use Topics  . Alcohol use: Yes   Allergies  Allergen Reactions  . Codeine Nausea Only and Rash  . Morphine Nausea And Vomiting    Pt allergy may be codeine as codeine or morphine were combined on previous health history. Pt reported upset stomach on questionnaire.  . Sulfa Antibiotics Nausea And Vomiting   Prior to Admission medications   Medication Sig Start Date End Date Taking? Authorizing Provider  amiodarone (PACERONE) 200 MG tablet Take 1 tablet (200 mg total) by mouth daily. 12/18/18  Yes Dunn, Areta Haber, PA-C  apixaban (ELIQUIS) 5 MG TABS tablet Take 1 tablet (5 mg total) by mouth 2 (two) times daily. 12/18/18  Yes Dunn, Areta Haber, PA-C  Cholecalciferol (VITAMIN D3) 1000 units CAPS Take 1,000 Units by mouth daily.   Yes [provider]  escitalopram (LEXAPRO) 10 MG tablet Take 1 tablet (10 mg  total) by mouth daily. 12/18/18  Yes Crecencio Mc, MD  furosemide (LASIX) 20 MG tablet Take 1 tablet (20 mg total) by mouth 2 (two) times daily. 12/18/18  Yes Dunn, Areta Haber, PA-C  Multiple Vitamin (MULTIVITAMIN WITH MINERALS) TABS tablet Take 1 tablet by mouth daily.   Yes [provider]  vitamin B-12 (CYANOCOBALAMIN) 1000 MCG tablet Take 1,000 mcg by mouth daily.   Yes [provider]  vitamin C (ASCORBIC ACID) 250 MG tablet Take 250 mg by mouth daily.   Yes [provider]     Review of Systems  Constitutional: Negative for appetite change and fatigue.  HENT: Negative for congestion,  postnasal drip and sore throat.   Eyes: Negative.   Respiratory: Negative for cough and shortness of breath.   Cardiovascular: Positive for palpitations (at times). Negative for chest pain and leg swelling.  Gastrointestinal: Negative for abdominal distention and abdominal pain.  Endocrine: Negative.   Genitourinary: Negative.   Musculoskeletal: Negative for back pain and neck pain.  Skin: Negative.   Allergic/Immunologic: Negative.   Neurological: Negative for dizziness and light-headedness.  Hematological: Negative for adenopathy. Does not bruise/bleed easily.  Psychiatric/Behavioral: Negative for dysphoric mood and sleep disturbance (sleeping ~ 5 hours/ night). The patient is nervous/anxious.    Vitals:   12/31/18 1230  BP: (!) 147/97  Pulse: 62  Resp: 18  SpO2: 96%  Weight: 148 lb 8 oz (67.4 kg)  Height: 5' (1.524 m)   Wt Readings from Last 3 Encounters:  12/31/18 148 lb 8 oz (67.4 kg)  12/23/18 148 lb 9.4 oz (67.4 kg)  12/18/18 148 lb 8 oz (67.4 kg)   Lab Results  Component Value Date   CREATININE 0.91 12/18/2018   CREATININE 1.09 (H) 12/04/2018   CREATININE 1.00 12/01/2018     Physical Exam Vitals signs and nursing note reviewed.  Constitutional:      Appearance: Normal appearance.  HENT:     Head: Normocephalic and atraumatic.  Neck:     Musculoskeletal: Normal range of motion and neck supple.  Cardiovascular:     Rate and Rhythm: Normal rate and regular rhythm.  Pulmonary:     Effort: Pulmonary effort is normal. No respiratory distress.     Breath sounds: No wheezing or rales.  Abdominal:     Palpations: Abdomen is soft.     Tenderness: There is no abdominal tenderness.  Musculoskeletal:        General: No tenderness.     Right lower leg: No edema.     Left lower leg: No edema.  Skin:    General: Skin is warm and dry.  Neurological:     General: No focal deficit present.     Mental Status: She is alert and oriented to person, place, and time.   Psychiatric:        Mood and Affect: Mood normal.        Behavior: Behavior normal.     Assessment & Plan:  1: Chronic heart failure with mildly reduced ejection fraction- - NYHA class I - euvolemic today - weighing daily and says that her weight has been stable; instructed to call for an overnight weight gain of >2 pounds or a weekly weight gain of >5 pounds - not adding salt to her food and tries to closely follow a low sodium diet - currently asymptomatic and EF >40% so no indication for entresto  2: Atrial fibrillation- - saw cardiology (Dunn) 12/18/2018 - on amiodarone and apixaban  3: HTN- - BP mildly elevated today but patient had a long walk to get to our office; previous BP has been normal - saw PCP Derrel Nip) 12/04/2018 - BMP 12/18/2018 reviewed and showed sodium 140, potassium 4.5, creatinine 0.91 and GFR 63  Medication list reviewed.  Return in 2 months or sooner for any questions/problems before then.

## 2018-12-30 NOTE — Progress Notes (Signed)
Cardiology Office Note Date:  01/01/2019  Patient ID:  Melissa Bonilla, [place] 05/07/1946, MRN [id number] PCP:  Crecencio Mc, MD  Cardiologist:  Dr. Saunders Revel, MD    Chief Complaint: Follow-up A. fib  History of Present Illness: Melissa Bonilla is a 73 y.o. female with history of recently diagnosed A. fib status post briefly successful cardioversion on 11/28/2018 on Eliquis as detailed below, HFrEF, hypertension, hyperlipidemia, depression, and remote tobacco abuse who presents for follow up of A. fib.  Patient was recently admitted in early 11/2018 with new onset Afib with RVR without any previously known cardiac history though did report undergoing a stress test approximately 20 years ago.  She had reported upper chest discomfort that began 2 days prior to her admission and was worse with deep inspiration.  There is also increased shortness of breath.  In the ED, she was noted to be in A. fib with RVR.  CT chest showed bilateral groundglass opacities as well as consolidation in the right upper lobe concerning for pneumonia.  COVID-19 test was negative.  Cardiac enzymes were negative.  Echo on 11/26/2018 showed an EF of 35 to 40%, diffuse LV hypokinesis, normal RV systolic function, mildly to moderately dilated left atrium, mildly dilated right atrium, moderate mitral regurgitation, mild to moderate tricuspid regurgitation, tricuspid aortic valve, normal size and structure of aortic root.  She was gently diuresed and underwent TEE guided cardioversion on 11/28/2018 with TEE showing an EF of 40 to 45%.  Upon insertion of the echo probe the patient developed an uncontrolled cough felt to be secondary to laryngeal spasm leading to inability to perform a complete study though it was able to be determined that there was no evidence of thrombus.  She underwent successful cardioversion on 6/11 and following this procedure she was noted to have frequent PVCs and what seemed to be a run of atrial flutter with 2-1 AV  block with heart rate of 160 bpm and lasted for approximately 1 to 2 minutes.  In this setting, she was given a bolus of IV amiodarone and had a hypotensive episode which was successfully treated with 1 L normal saline bolus and 100 mcg of phenylephrine.  Unfortunately, she was back in A. fib with RVR on 6/12.  She was loaded with amiodarone and started on digoxin for rate control with plans for repeat cardioversion as an outpatient.  Follow-up digoxin level on 12/04/2018 was noted to be elevated at 2.2 with recommendation to discontinue this medication.  She was most recently seen in the office on 12/18/2018 and was doing well.  Her weight remained stable.  She denies any symptoms.  She remains in rate controlled A. fib.  Her amiodarone was decreased to 200 mg twice daily.  She was continued on Lopressor 50 mg twice daily.  She was scheduled for repeat cardioversion on 12/23/2018 though notified our answering service on 7/3 that she was bradycardic with heart rates in the 40s bpm with stable BP.  She presented for scheduled cardioversion on 7/6 and was noted to be in sinus bradycardia with heart rate of 43 bpm leading to cancellation of cardioversion.  Her metoprolol was held and she was continued on amiodarone 200 mg once daily.  She was most recently seen by the [facility]F clinic on 12/31/2018 and was doing well.  BP was mildly elevated at 147/97.  No changes were made.  She comes in doing well from a cardiac perspective.  No chest pain, shortness of breath,  dizziness, presyncope, or syncope.  No lower extremity swelling, abdominal distention, orthopnea, PND, early satiety.  No falls, BRBPR, or melena.  She has rarely noted several second lasting palpitations first thing in the morning a couple times since she was last seen, though none recently.  She is compliant with all medications.  Since discontinuing beta-blocker she feels like her energy has significantly improved  Weight remains stable.     Labs: 12/18/2018 - Digoxin less than 0.4, Hgb 14.7, PLT 203, serum creatinine 0.91, potassium 4.5, albumin 4.1, AST/LT normal 11/30/2018 - Magnesium 1.8 11/26/2018 - TSH normal, LDL 95     Past Medical History:  Diagnosis Date   Arrhythmia    atrial fibrillation   CHF (congestive heart failure) ([place])    Depression    Hyperlipidemia    Menopausal syndrome (hot flashes) 1992   used HRT fo 2 yrs    Obstructive sleep apnea    Persistent atrial fibrillation     Past Surgical History:  Procedure Laterality Date   APPENDECTOMY  1972   CARDIOVERSION N/A 11/28/2018   Procedure: CARDIOVERSION;  Surgeon: [name], MD;  Location: [facility] ORS;  Service: Cardiovascular;  Laterality: N/A;   CARDIOVERSION N/A 12/23/2018   Procedure: CARDIOVERSION;  Surgeon: [name], MD;  Location: [facility] ORS;  Service: Cardiovascular;  Laterality: N/A;   [place]   TEE WITHOUT CARDIOVERSION N/A 11/28/2018   Procedure: TRANSESOPHAGEAL ECHOCARDIOGRAM (TEE);  Surgeon: [name], MD;  Location: [facility] ORS;  Service: Cardiovascular;  Laterality: N/A;    Current Meds  Medication Sig   amiodarone (PACERONE) 200 MG tablet Take 1 tablet (200 mg total) by mouth daily.   apixaban (ELIQUIS) 5 MG TABS tablet Take 1 tablet (5 mg total) by mouth 2 (two) times daily.   Cholecalciferol (VITAMIN D3) 1000 units CAPS Take 1,000 Units by mouth daily.   escitalopram (LEXAPRO) 10 MG tablet Take 1 tablet (10 mg total) by mouth daily.   furosemide (LASIX) 20 MG tablet Take 1 tablet (20 mg total) by mouth 2 (two) times daily.   Multiple Vitamin (MULTIVITAMIN WITH MINERALS) TABS tablet Take 1 tablet by mouth daily.   vitamin B-12 (CYANOCOBALAMIN) 1000 MCG tablet Take 1,000 mcg by mouth daily.   vitamin C (ASCORBIC ACID) 250 MG tablet Take 250 mg by mouth daily.    Allergies:   Codeine, Morphine, and Sulfa antibiotics   Social History:  The patient  reports that she  quit smoking about 28 years ago. Her smoking use included cigarettes. She has a 3.75 pack-year smoking history. She has never used smokeless tobacco. She reports current alcohol use. She reports that she does not use drugs.   Family History:  The patient's family history includes Arthritis in her maternal grandmother and mother; Breast cancer (age of onset: 79) in her paternal aunt; Breast cancer (age of onset: 85) in her paternal aunt; Heart disease in her mother; Heart disease (age of onset: 48) in her father; Hypertension in her maternal grandmother and mother; Stroke in her maternal grandmother.  ROS:   Review of Systems  Constitutional: Negative for chills, diaphoresis, fever, malaise/fatigue and weight loss.  HENT: Negative for congestion.   Eyes: Negative for discharge and redness.  Respiratory: Negative for cough, hemoptysis, sputum production, shortness of breath and wheezing.   Cardiovascular: Positive for palpitations. Negative for chest pain, orthopnea, claudication, leg swelling and PND.       Much improved palpitations  Gastrointestinal: Negative  for abdominal pain, blood in stool, heartburn, melena, nausea and vomiting.  Genitourinary: Negative for hematuria.  Musculoskeletal: Negative for falls and myalgias.  Skin: Negative for rash.  Neurological: Negative for dizziness, tingling, tremors, sensory change, speech change, focal weakness, loss of consciousness and weakness.  Endo/Heme/Allergies: Does not bruise/bleed easily.  Psychiatric/Behavioral: Negative for substance abuse. The patient is not nervous/anxious.   All other systems reviewed and are negative.    PHYSICAL EXAM:  VS:  BP (!) 140/94 (BP Location: Left Arm, Patient Position: Sitting, Cuff Size: Normal)    Pulse (!) 58    Temp (!) 97.4 F (36.3 C)    Ht 5' (1.524 m)    Wt 147 lb 8 oz (66.9 kg)    SpO2 95%    BMI 28.81 kg/m  BMI: Body mass index is 28.81 kg/m.  Physical Exam  Constitutional: She is oriented to  person, place, and time. She appears well-developed and well-nourished.  BP recheck at 149/80  HENT:  Head: Normocephalic and atraumatic.  Eyes: Right eye exhibits no discharge. Left eye exhibits no discharge.  Neck: Normal range of motion. No JVD present.  Cardiovascular: Normal rate, regular rhythm, S1 normal, S2 normal and normal heart sounds. Exam reveals no distant heart sounds, no friction rub, no midsystolic click and no opening snap.  No murmur heard. Pulses:      Dorsalis pedis pulses are 2+ on the right side and 2+ on the left side.       Posterior tibial pulses are 2+ on the right side and 2+ on the left side.  Pulmonary/Chest: Effort normal and breath sounds normal. No respiratory distress. She has no decreased breath sounds. She has no wheezes. She has no rales. She exhibits no tenderness.  Abdominal: Soft. She exhibits no distension. There is no abdominal tenderness.  Musculoskeletal:        General: No edema.  Neurological: She is alert and oriented to person, place, and time.  Skin: Skin is warm and dry. No cyanosis. Nails show no clubbing.  Psychiatric: She has a normal mood and affect. Her speech is normal and behavior is normal. Judgment and thought content normal.     EKG:  Was ordered and interpreted by me today. Shows sinus bradycardia, 58 bpm, nonspecific st/t changes diffusely (unchanged from prior)  Recent Labs: 11/26/2018: TSH 1.224 11/30/2018: Magnesium 1.8 12/18/2018: ALT 25; BUN 14; Creatinine, Ser 0.91; Hemoglobin 14.7; Platelets 203; Potassium 4.5; Sodium 140  11/26/2018: Cholesterol 145; HDL 39; LDL Cholesterol 95; Total CHOL/HDL Ratio 3.7; Triglycerides 54; VLDL 11   Estimated Creatinine Clearance: 47.7 mL/min (by C-G formula based on SCr of 0.91 mg/dL).   Wt Readings from Last 3 Encounters:  01/01/19 147 lb 8 oz (66.9 kg)  12/31/18 148 lb 8 oz (67.4 kg)  12/23/18 148 lb 9.4 oz (67.4 kg)     Other studies reviewed: Additional studies/records reviewed  today include: summarized above  ASSESSMENT AND PLAN:  1. Persistent A. Fib/flutter: She is maintaining sinus rhythm with a mildly bradycardic heart rate.  No longer on beta-blocker.  She feels like her energy has significantly improved and is back to baseline.  Given her mild bradycardia, we will further decrease her amiodarone to 100 mg daily.  Perhaps in the near future we will be able to ultimately discontinue this medication for her followed by addition of low-dose beta-blocker down the road pending her heart rate.  Continue Eliquis 5 mg twice daily as she does not meet reduced dosing  criteria.  She denies any symptoms concerning for bleeding.  Check CBC.  2. HFrEF: She appears grossly euvolemic and well compensated.  No longer on beta-blocker secondary to bradycardic heart rates.  Perhaps moving forward we can transition her from amiodarone back to beta-blockade however she did note significant fatigue with metoprolol recently.  We will add losartan 12.5 mg today given elevated blood pressure readings and in an effort to optimize heart failure regimen.  Schedule repeat limited echocardiogram to evaluate for improvement in EF following restoration of sinus rhythm.  If EF has improved we will plan to proceed with Lexiscan Myoview to evaluate for high risk ischemia.  However, if EF remains reduced we will likely need to proceed with right and left cardiac catheterization along with further escalation of evidence-based heart failure therapy including possible rechallenge of alternative beta-blocker if heart rate allows and addition of spironolactone.  Now that she has maintained sinus rhythm we may be able to de-escalate her Lasix.  Check BMP with further recommendation on Lasix dosing pending these results.  3. Mitral regurgitation: Asymptomatic.  Monitor with periodic echo.  4. Hypertension: Blood pressure remains mildly elevated at 140/94 with recheck at 149/80.  Start losartan 12.5 mg daily.  Check  BMP today and again in 1 week.  Cannot exclude underlying undiagnosed sleep apnea.  She is scheduled to see pulmonology later this month as outlined below for consideration of sleep study.  5. Sleep disordered breathing/insomnia: She has been referred to pulmonology for consideration of sleep study and has an appointment with them on 01/16/2019.  6. Elevated LFTs: Most recent hepatic function panel on 12/18/2018 demonstrated normal liver function.  Disposition: F/u with Dr. Saunders Revel or an APP in 2 months.  Current medicines are reviewed at length with the patient today.  The patient did not have any concerns regarding medicines.  Signed, Christell Faith, PA-C 01/01/2019 10:15 AM     [address] [place], [place] [address] [phone]

## 2018-12-31 ENCOUNTER — Encounter: Payer: Self-pay | Admitting: Family

## 2018-12-31 ENCOUNTER — Ambulatory Visit: Payer: Medicare HMO | Attending: Family | Admitting: Family

## 2018-12-31 ENCOUNTER — Other Ambulatory Visit: Payer: Self-pay

## 2018-12-31 ENCOUNTER — Telehealth: Payer: Self-pay | Admitting: Physician Assistant

## 2018-12-31 VITALS — BP 147/97 | HR 62 | Resp 18 | Ht 60.0 in | Wt 148.5 lb

## 2018-12-31 DIAGNOSIS — Z79899 Other long term (current) drug therapy: Secondary | ICD-10-CM | POA: Insufficient documentation

## 2018-12-31 DIAGNOSIS — I10 Essential (primary) hypertension: Secondary | ICD-10-CM

## 2018-12-31 DIAGNOSIS — F419 Anxiety disorder, unspecified: Secondary | ICD-10-CM | POA: Insufficient documentation

## 2018-12-31 DIAGNOSIS — I4819 Other persistent atrial fibrillation: Secondary | ICD-10-CM

## 2018-12-31 DIAGNOSIS — I4891 Unspecified atrial fibrillation: Secondary | ICD-10-CM | POA: Insufficient documentation

## 2018-12-31 DIAGNOSIS — Z803 Family history of malignant neoplasm of breast: Secondary | ICD-10-CM | POA: Insufficient documentation

## 2018-12-31 DIAGNOSIS — Z87891 Personal history of nicotine dependence: Secondary | ICD-10-CM | POA: Insufficient documentation

## 2018-12-31 DIAGNOSIS — Z7901 Long term (current) use of anticoagulants: Secondary | ICD-10-CM | POA: Diagnosis not present

## 2018-12-31 DIAGNOSIS — Z885 Allergy status to narcotic agent status: Secondary | ICD-10-CM | POA: Diagnosis not present

## 2018-12-31 DIAGNOSIS — I110 Hypertensive heart disease with heart failure: Secondary | ICD-10-CM | POA: Insufficient documentation

## 2018-12-31 DIAGNOSIS — Z882 Allergy status to sulfonamides status: Secondary | ICD-10-CM | POA: Diagnosis not present

## 2018-12-31 DIAGNOSIS — G4733 Obstructive sleep apnea (adult) (pediatric): Secondary | ICD-10-CM | POA: Diagnosis not present

## 2018-12-31 DIAGNOSIS — Z8249 Family history of ischemic heart disease and other diseases of the circulatory system: Secondary | ICD-10-CM | POA: Insufficient documentation

## 2018-12-31 DIAGNOSIS — I509 Heart failure, unspecified: Secondary | ICD-10-CM | POA: Diagnosis not present

## 2018-12-31 DIAGNOSIS — F329 Major depressive disorder, single episode, unspecified: Secondary | ICD-10-CM | POA: Insufficient documentation

## 2018-12-31 DIAGNOSIS — I5022 Chronic systolic (congestive) heart failure: Secondary | ICD-10-CM

## 2018-12-31 NOTE — Telephone Encounter (Signed)
    COVID-19 Pre-Screening Questions:  . In the past 7 to 10 days have you had a cough,  shortness of breath, headache, congestion, fever (100 or greater) body aches, chills, sore throat, or sudden loss of taste or sense of smell? no . Have you been around anyone with known Covid 19. . Have you been around anyone who is awaiting Covid 19 test results in the past 7 to 10 days? No . Have you been around anyone who has been exposed to Covid 19, or has mentioned symptoms of Covid 19 within the past 7 to 10 days?no  If you have any concerns/questions about symptoms patients report during screening (either on the phone or at threshold). Contact the provider seeing the patient or DOD for further guidance.  If neither are available contact a member of the leadership team.

## 2018-12-31 NOTE — Patient Instructions (Signed)
Continue weighing daily and call for an overnight weight gain of > 2 pounds or a weekly weight gain of >5 pounds. 

## 2019-01-01 ENCOUNTER — Other Ambulatory Visit: Payer: Self-pay

## 2019-01-01 ENCOUNTER — Ambulatory Visit (INDEPENDENT_AMBULATORY_CARE_PROVIDER_SITE_OTHER): Payer: Medicare HMO | Admitting: Physician Assistant

## 2019-01-01 ENCOUNTER — Encounter: Payer: Self-pay | Admitting: Physician Assistant

## 2019-01-01 VITALS — BP 140/94 | HR 58 | Temp 97.4°F | Ht 60.0 in | Wt 147.5 lb

## 2019-01-01 DIAGNOSIS — G4700 Insomnia, unspecified: Secondary | ICD-10-CM

## 2019-01-01 DIAGNOSIS — I10 Essential (primary) hypertension: Secondary | ICD-10-CM

## 2019-01-01 DIAGNOSIS — I5022 Chronic systolic (congestive) heart failure: Secondary | ICD-10-CM

## 2019-01-01 DIAGNOSIS — G4730 Sleep apnea, unspecified: Secondary | ICD-10-CM | POA: Diagnosis not present

## 2019-01-01 DIAGNOSIS — I340 Nonrheumatic mitral (valve) insufficiency: Secondary | ICD-10-CM

## 2019-01-01 DIAGNOSIS — I4819 Other persistent atrial fibrillation: Secondary | ICD-10-CM | POA: Diagnosis not present

## 2019-01-01 MED ORDER — LOSARTAN POTASSIUM 25 MG PO TABS: 12.5000 mg | ORAL_TABLET | Freq: Every day | ORAL | 3 refills | Status: DC

## 2019-01-01 MED ORDER — AMIODARONE HCL 100 MG PO TABS: 100.0000 mg | ORAL_TABLET | Freq: Every day | ORAL | 1 refills | Status: DC

## 2019-01-01 NOTE — Patient Instructions (Addendum)
Medication Instructions:  Your physician has recommended you make the following change in your medication:   1) REDUCE Amiodarone to 100mg  daily  2) START Losartan 1/2 tablet (12.5mg ) daily  If you need a refill on your cardiac medications before your next appointment, please call your pharmacy.   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 You do not need an appointment. Hours are M-F 8am-4:30pm   If you have labs (blood work) drawn today and your tests are completely normal, you will receive your results only by: Marland Kitchen MyChart Message (if you have MyChart) OR . A paper copy in the mail If you have any lab test that is abnormal or we need to change your treatment, we will call you to review the results.  Testing/Procedures: Your physician has requested that you have an echocardiogram. Echocardiography is a painless test that uses sound waves to create images of your heart. It provides your doctor with information about the size and shape of your heart and how well your heart's chambers and valves are working. This procedure takes approximately one hour. There are no restrictions for this procedure.    Follow-Up: At [facility], you and your health needs are our priority.  As part of our continuing mission to provide you with exceptional heart care, we have created designated Provider Care Teams.  These Care Teams include your primary Cardiologist (physician) and Advanced Practice Providers (APPs -  Physician Assistants and Nurse Practitioners) who all work together to provide you with the care you need, when you need it. You will need a follow up appointment in 2 months. You may see  Dr.Arida or one of the following Advanced Practice Providers on your designated Care Team:   Murray Hodgkins, NP Christell Faith, PA-C . Marrianne Mood, PA-C

## 2019-01-02 LAB — CBC WITH DIFFERENTIAL/PLATELET
Basophils Absolute: 0.1 10*3/uL (ref 0.0–0.2)
Basos: 1 %
EOS (ABSOLUTE): 0.2 10*3/uL (ref 0.0–0.4)
Eos: 4 %
Hematocrit: 42.9 % (ref 34.0–46.6)
Hemoglobin: 14.9 g/dL (ref 11.1–15.9)
Immature Grans (Abs): 0 10*3/uL (ref 0.0–0.1)
Immature Granulocytes: 0 %
Lymphocytes Absolute: 1.9 10*3/uL (ref 0.7–3.1)
Lymphs: 39 %
MCH: 30.9 pg (ref 26.6–33.0)
MCHC: 34.7 g/dL (ref 31.5–35.7)
MCV: 89 fL (ref 79–97)
Monocytes Absolute: 0.5 10*3/uL (ref 0.1–0.9)
Monocytes: 11 %
Neutrophils Absolute: 2.1 10*3/uL (ref 1.4–7.0)
Neutrophils: 45 %
Platelets: 199 10*3/uL (ref 150–450)
RBC: 4.82 x10E6/uL (ref 3.77–5.28)
RDW: 12.8 % (ref 11.7–15.4)
WBC: 4.8 10*3/uL (ref 3.4–10.8)

## 2019-01-02 LAB — BASIC METABOLIC PANEL
BUN/Creatinine Ratio: 20 (ref 12–28)
BUN: 18 mg/dL (ref 8–27)
CO2: 23 mmol/L (ref 20–29)
Calcium: 9.3 mg/dL (ref 8.7–10.3)
Chloride: 101 mmol/L (ref 96–106)
Creatinine, Ser: 0.91 mg/dL (ref 0.57–1.00)
GFR calc Af Amer: 73 mL/min/{1.73_m2} (ref >59)
GFR calc non Af Amer: 63 mL/min/{1.73_m2} (ref >59)
Glucose: 94 mg/dL (ref 65–99)
Potassium: 4.6 mmol/L (ref 3.5–5.2)
Sodium: 139 mmol/L (ref 134–144)

## 2019-01-15 ENCOUNTER — Telehealth: Payer: Self-pay | Admitting: Internal Medicine

## 2019-01-15 NOTE — Telephone Encounter (Signed)
Called patient for COVID-19 pre-screening for in office visit. ° °Have you recently traveled any where out of the local area in the last 2 weeks? No °Have you been in close contact with a person diagnosed with COVID-19 or someone awaiting results within the last 2 weeks? No ° °Do you currently have any of the following symptoms? If so, when did they start? °Cough     Diarrhea   Joint Pain °Fever      Muscle Pain   Red eyes °Shortness of breath   Abdominal pain  Vomiting °Loss of smell    Rash    Sore Throat °Headache    Weakness   Bruising or bleeding ° ° °Okay to proceed with visit 01/16/2019 °  ° ° °

## 2019-01-16 ENCOUNTER — Encounter: Payer: Self-pay | Admitting: Internal Medicine

## 2019-01-16 ENCOUNTER — Ambulatory Visit (INDEPENDENT_AMBULATORY_CARE_PROVIDER_SITE_OTHER): Payer: Medicare HMO | Admitting: Internal Medicine

## 2019-01-16 ENCOUNTER — Other Ambulatory Visit: Payer: Self-pay

## 2019-01-16 VITALS — BP 170/72 | HR 57 | Temp 97.3°F | Ht 60.0 in | Wt 148.4 lb

## 2019-01-16 DIAGNOSIS — G4719 Other hypersomnia: Secondary | ICD-10-CM

## 2019-01-16 NOTE — Patient Instructions (Signed)

## 2019-01-16 NOTE — Progress Notes (Signed)
[name]      Assessment and Plan:  Excessive daytime sleepiness. -Symptoms and signs obstructive sleep apnea. - We will send for sleep study.  Atrial fibrillation, history of congestive heart failure. - Sleep apnea can contribute to above condition, therefore treatment of sleep apnea is an important part of their management.  Orders Placed This Encounter  Procedures  . Home sleep test   Return in about 3 months (around 04/18/2019).   Date: 01/16/2019  MRN# [id number] Melissa Bonilla [birth year]    Melissa Bonilla is a 73 y.o. old female seen in consultation for chief complaint of:    Chief Complaint  Patient presents with  . Consult    Dr.R.Dunn-Having difficulty staying asleep but no longer having symptoms of snoring or breathing issues.     HPI:  Melissa Bonilla is a 73 y.o. old female with a history of atrial fibrillation as well as heart failure with reduced ejection fraction. She was in the hospital in June with Afib, acute CHF and pneumonia. She would notice at that time she would wake herself up from sleep.  She falls asleep for 30 min if she sits down to read or watching tv late night.  She notes that she does not have dyspnea any more, she is active during the day, and cleans her own home, she does a lot of walking.   **CT chest 11/25/2018>> imaging personally reviewed, there is a bilateral groundglass changes, suggestive of pneumonitis or pulmonary vascular edema. **Chest x-ray 11/18/2018>> imaging personally reviewed, mild pulmonary vascular edema **Echocardiogram transesophageal 11/28/2018>> EF is 40% **Echocardiogram 11/26/2018>> EF is 35%.  PMHX:   Past Medical History:  Diagnosis Date  . Arrhythmia    atrial fibrillation  . CHF (congestive heart failure) ([place])   . Depression   . Hyperlipidemia   . Menopausal syndrome (hot flashes) 1992   used HRT fo 2 yrs   . Obstructive sleep apnea   . Persistent atrial fibrillation    Surgical Hx:  Past Surgical History:  Procedure Laterality Date  . APPENDECTOMY  1972  . CARDIOVERSION N/A 11/28/2018   Procedure: CARDIOVERSION;  Surgeon: [name], MD;  Location: [facility] ORS;  Service: Cardiovascular;  Laterality: N/A;  . CARDIOVERSION N/A 12/23/2018   Procedure: CARDIOVERSION;  Surgeon: [name], MD;  Location: [facility] ORS;  Service: Cardiovascular;  Laterality: N/A;  . [place]  . TEE WITHOUT CARDIOVERSION N/A 11/28/2018   Procedure: TRANSESOPHAGEAL ECHOCARDIOGRAM (TEE);  Surgeon: [name], MD;  Location: [facility] ORS;  Service: Cardiovascular;  Laterality: N/A;   Family Hx:  Family History  Problem Relation Age of Onset  . Arthritis Mother        Rheumatoid  . Heart disease Mother        Valvular  . Hypertension Mother   . Heart disease Father 17       CABG  . Arthritis Maternal Grandmother        Rheumatoid  . Hypertension Maternal Grandmother   . Stroke Maternal Grandmother   . Breast cancer Paternal Aunt 68  . Breast cancer Paternal Aunt 58   Social Hx:   Social History   Tobacco Use  . Smoking status: Former Smoker    Packs/day: 0.25    Years: 15.00    Pack years: 3.75    Types: Cigarettes    Quit date: 1992    Years since quitting: 28.5  . Smokeless tobacco:  Never Used  Substance Use Topics  . Alcohol use: Yes  . Drug use: No   Medication:    Current Outpatient Medications:  .  amiodarone (PACERONE) 100 MG tablet, Take 1 tablet (100 mg total) by mouth daily., Disp: 90 tablet, Rfl: 1 .  apixaban (ELIQUIS) 5 MG TABS tablet, Take 1 tablet (5 mg total) by mouth 2 (two) times daily., Disp: 60 tablet, Rfl: 0 .  Cholecalciferol (VITAMIN D3) 1000 units CAPS, Take 1,000 Units by mouth daily., Disp: , Rfl:  .  escitalopram (LEXAPRO) 10 MG tablet, Take 1 tablet (10 mg total) by mouth daily., Disp: 90 tablet, Rfl: 1 .  furosemide (LASIX) 20 MG tablet, Take 1 tablet (20 mg total) by mouth 2 (two) times daily.,  Disp: 180 tablet, Rfl: 3 .  losartan (COZAAR) 25 MG tablet, Take 0.5 tablets (12.5 mg total) by mouth daily., Disp: 45 tablet, Rfl: 3 .  Multiple Vitamin (MULTIVITAMIN WITH MINERALS) TABS tablet, Take 1 tablet by mouth daily., Disp: , Rfl:  .  vitamin B-12 (CYANOCOBALAMIN) 1000 MCG tablet, Take 1,000 mcg by mouth daily., Disp: , Rfl:  .  vitamin C (ASCORBIC ACID) 250 MG tablet, Take 250 mg by mouth daily., Disp: , Rfl:    Allergies:  Codeine, Morphine, and Sulfa antibiotics  Review of Systems: Gen:  Denies  fever, sweats, chills HEENT: Denies blurred vision, double vision. bleeds, sore throat Cvc:  No dizziness, chest pain. Resp:   Denies cough or sputum production, shortness of breath Gi: Denies swallowing difficulty, stomach pain. Gu:  Denies bladder incontinence, burning urine Ext:   No Joint pain, stiffness. Skin: No skin rash,  hives  Endoc:  No polyuria, polydipsia. Psych: No depression, insomnia. Other:  All other systems were reviewed with the patient and were negative other that what is mentioned in the HPI.   Physical Examination:   VS: BP (!) 170/72 (BP Location: Left Arm, Cuff Size: Normal)   Pulse (!) 57   Temp (!) 97.3 F (36.3 C) (Skin)   Ht 5' (1.524 m)   Wt 148 lb 6.4 oz (67.3 kg)   SpO2 96%   BMI 28.98 kg/m   General Appearance: No distress  Neuro:without focal findings,  speech normal,  HEENT: PERRLA, EOM intact.   Pulmonary: normal breath sounds, No wheezing.  CardiovascularNormal S1,S2.  No m/r/g.   Abdomen: Benign, Soft, non-tender. Renal:  No costovertebral tenderness  GU:  No performed at this time. Endoc: No evident thyromegaly, no signs of acromegaly. Skin:   warm, no rashes, no ecchymosis  Extremities: normal, no cyanosis, clubbing.  Other findings:    LABORATORY PANEL:   CBC No results for input(s): WBC, HGB, HCT, PLT in the last 168 hours.  ------------------------------------------------------------------------------------------------------------------  Chemistries  No results for input(s): NA, K, CL, CO2, GLUCOSE, BUN, CREATININE, CALCIUM, MG, AST, ALT, ALKPHOS, BILITOT in the last 168 hours.  Invalid input(s): GFRCGP ------------------------------------------------------------------------------------------------------------------  Cardiac Enzymes No results for input(s): TROPONINI in the last 168 hours. ------------------------------------------------------------  RADIOLOGY:  No results found.     Thank  you for the consultation and for allowing [facility] to assist in the care of your patient. Our recommendations are noted above.  Please contact us if we can be of further service.   Marda Stalker, M.D., F.C.C.P.  Board Certified in Internal Medicine, Pulmonary Medicine, [place], and Sleep Medicine.  [facility] Office Number: [phone]   01/16/2019

## 2019-01-20 ENCOUNTER — Encounter: Payer: Self-pay | Admitting: Internal Medicine

## 2019-01-20 ENCOUNTER — Ambulatory Visit (INDEPENDENT_AMBULATORY_CARE_PROVIDER_SITE_OTHER): Payer: Medicare HMO | Admitting: Internal Medicine

## 2019-01-20 ENCOUNTER — Other Ambulatory Visit: Payer: Self-pay

## 2019-01-20 ENCOUNTER — Other Ambulatory Visit
Admission: RE | Admit: 2019-01-20 | Discharge: 2019-01-20 | Disposition: A | Discharge status: Home / Self Care | Payer: Medicare HMO | Source: Ambulatory Visit | Attending: Physician Assistant | Admitting: Physician Assistant

## 2019-01-20 DIAGNOSIS — I4819 Other persistent atrial fibrillation: Secondary | ICD-10-CM

## 2019-01-20 DIAGNOSIS — F4323 Adjustment disorder with mixed anxiety and depressed mood: Secondary | ICD-10-CM | POA: Diagnosis not present

## 2019-01-20 DIAGNOSIS — I5022 Chronic systolic (congestive) heart failure: Secondary | ICD-10-CM | POA: Diagnosis not present

## 2019-01-20 DIAGNOSIS — R928 Other abnormal and inconclusive findings on diagnostic imaging of breast: Secondary | ICD-10-CM

## 2019-01-20 DIAGNOSIS — I420 Dilated cardiomyopathy: Secondary | ICD-10-CM

## 2019-01-20 DIAGNOSIS — Z1231 Encounter for screening mammogram for malignant neoplasm of breast: Secondary | ICD-10-CM

## 2019-01-20 LAB — BASIC METABOLIC PANEL
Anion gap: 9 (ref 5–15)
BUN: 21 mg/dL (ref 8–23)
CO2: 26 mmol/L (ref 22–32)
Calcium: 9.2 mg/dL (ref 8.9–10.3)
Chloride: 104 mmol/L (ref 98–111)
Creatinine, Ser: 0.85 mg/dL (ref 0.44–1.00)
GFR calc Af Amer: >60 mL/min (ref >60)
GFR calc non Af Amer: >60 mL/min (ref >60)
Glucose, Bld: 94 mg/dL (ref 70–99)
Potassium: 4.1 mmol/L (ref 3.5–5.1)
Sodium: 139 mmol/L (ref 135–145)

## 2019-01-20 NOTE — Addendum Note (Signed)
Addended by: Santiago Bur on: 01/20/2019 11:37 AM   Modules accepted: Orders

## 2019-01-20 NOTE — Progress Notes (Signed)
Telephone  Note  This visit type was conducted due to national recommendations for restrictions regarding the COVID-19 pandemic (e.g. social distancing).  This format is felt to be most appropriate for this patient at this time.  All issues noted in this document were discussed and addressed.  No physical exam was performed (except for noted visual exam findings with Video Visits).   I connected with@ on 01/20/19 at  9:30 AM EDT by  telephone and verified that I am speaking with the correct person using two identifiers. Location patient: home Location provider: work or home office Persons participating in the virtual visit: patient, provider  I discussed the limitations, risks, security and privacy concerns of performing an evaluation and management service by telephone and the availability of in person appointments. I also discussed with the patient that there may be a patient responsible charge related to this service. The patient expressed understanding and agreed to proceed.  Reason for visit: follow up on anxiety,chf  HPI:  73 yr old female recently diagnosed with cardiomyopathy resulting in systolic heart failure,  Subsequent reactive depression/anxiety started on lexapro 6 weeks ago.   She states that she is feeling much better on 10 mg lexapro.  Less anxious ,  Outlook more positive,  Sleeping well.   Chronic AF: medications adjusted by End (metoprolol dc'd and amio reduced to 100 mg daily) . Energy level excellent. Walking 1 mile daily without SOB Weight stable,  Still taking lasix daily.  (did not see mychart message from cardiology dated July 15 with rec to reduce to prn use) chf weighing instructions reviewed ( resume lasix for 2 lb overnight/5 lb over week )  ROS: See pertinent positives and negatives per HPI.  Past Medical History:  Diagnosis Date  . Arrhythmia    atrial fibrillation  . CHF (congestive heart failure) ([place])   . Depression   . Hyperlipidemia   . Menopausal  syndrome (hot flashes) 1992   used HRT fo 2 yrs   . Obstructive sleep apnea   . Persistent atrial fibrillation     Past Surgical History:  Procedure Laterality Date  . APPENDECTOMY  1972  . CARDIOVERSION N/A 11/28/2018   Procedure: CARDIOVERSION;  Surgeon: [name], MD;  Location: [facility];  Service: Cardiovascular;  Laterality: N/A;  . CARDIOVERSION N/A 12/23/2018   Procedure: CARDIOVERSION;  Surgeon: [name], MD;  Location: [facility] OR[facility];  Service: Cardiovascular;  Laterality: N/A;  . [place]  . TEE WITHOUT CARDIOVERSION N/A 11/28/2018   Procedure: TRANSESOPHAGEAL ECHOCARDIOGRAM (TEE);  Surgeon: [name], MD;  Location: [facility] OR[facility];  Service: Cardiovascular;  Laterality: N/A;    Family History  Problem Relation Age of Onset  . Arthritis Mother        Rheumatoid  . Heart disease Mother        Valvular  . Hypertension Mother   . Heart disease Father 21       CABG  . Arthritis Maternal Grandmother        Rheumatoid  . Hypertension Maternal Grandmother   . Stroke Maternal Grandmother   . Breast cancer Paternal Aunt 31  . Breast cancer Paternal Aunt 69    SOCIAL HX:  reports that she quit smoking about 28 years ago. Her smoking use included cigarettes. She has a 3.75 pack-year smoking history. She has never used smokeless tobacco. She reports current alcohol use. She reports that she does not use drugs.   Current  Outpatient Medications:  .  amiodarone (PACERONE) 100 MG tablet, Take 1 tablet (100 mg total) by mouth daily., Disp: 90 tablet, Rfl: 1 .  apixaban (ELIQUIS) 5 MG TABS tablet, Take 1 tablet (5 mg total) by mouth 2 (two) times daily., Disp: 60 tablet, Rfl: 0 .  Cholecalciferol (VITAMIN D3) 1000 units CAPS, Take 1,000 Units by mouth daily., Disp: , Rfl:  .  escitalopram (LEXAPRO) 10 MG tablet, Take 1 tablet (10 mg total) by mouth daily., Disp: 90 tablet, Rfl: 1 .  furosemide (LASIX) 20 MG tablet, Take 1 tablet (20 mg total) by  mouth 2 (two) times daily., Disp: 180 tablet, Rfl: 3 .  losartan (COZAAR) 25 MG tablet, Take 0.5 tablets (12.5 mg total) by mouth daily., Disp: 45 tablet, Rfl: 3 .  Multiple Vitamin (MULTIVITAMIN WITH MINERALS) TABS tablet, Take 1 tablet by mouth daily., Disp: , Rfl:  .  vitamin B-12 (CYANOCOBALAMIN) 1000 MCG tablet, Take 1,000 mcg by mouth daily., Disp: , Rfl:  .  vitamin C (ASCORBIC ACID) 250 MG tablet, Take 250 mg by mouth daily., Disp: , Rfl:   EXAM:  General impression: alert, cooperative and articulate.  No signs of being in distress  Lungs: speech is fluent sentence length suggests that patient is not short of breath and not punctuated by cough, sneezing or sniffing. Marland Kitchen   Psych: affect normal.  speech is articulate and non pressured .  Denies suicidal thoughts   ASSESSMENT AND PLAN:  Abnormal mammogram of left breast Stable nodule noted on 2017 diagnostic mammogram.  12 month follow up bilateral diagnostic mammogram was not done and has been ordered now.   Adjustment reaction with anxiety and depression Aggravated by recent medical issues and COVID 19 pandemic .  She is feeling more positive and sleeping well with lexapro 10 mg daily  No changes today   Persistent atrial fibrillation She is currently rate controlled with amiodarone 100 mg daily .  Metoprolol has been discontinued due to bradycardia and fatigue,  both symptomsm have improved. continue xarelto ; plans to repeat cardioversion as an outpatient   Dilated cardiomyopathy ([place]) Her exercise tolerance is improving.  She has been reminded to stop daily use of lasix and follow chf protocol     I discussed the assessment and treatment plan with the patient. The patient was provided an opportunity to ask questions and all were answered. The patient agreed with the plan and demonstrated an understanding of the instructions.   The patient was advised to call back or seek an in-person evaluation if the symptoms worsen or if the  condition fails to improve as anticipated.  I provided 30 minutes of non-face-to-face time during this encounter.   Crecencio Mc, MD

## 2019-01-21 DIAGNOSIS — F4323 Adjustment disorder with mixed anxiety and depressed mood: Secondary | ICD-10-CM | POA: Insufficient documentation

## 2019-01-21 NOTE — Assessment & Plan Note (Signed)
Her exercise tolerance is improving.  She has been reminded to stop daily use of lasix and follow chf protocol

## 2019-01-21 NOTE — Assessment & Plan Note (Addendum)
She is currently rate controlled with amiodarone 100 mg daily .  Metoprolol has been discontinued due to bradycardia and fatigue,  both symptomsm have improved. continue xarelto ; plans to repeat cardioversion as an outpatient

## 2019-01-21 NOTE — Assessment & Plan Note (Signed)
Aggravated by recent medical issues and COVID 19 pandemic .  She is feeling more positive and sleeping well with lexapro 10 mg daily  No changes today

## 2019-01-21 NOTE — Assessment & Plan Note (Signed)
Stable nodule noted on 2017 diagnostic mammogram.  12 month follow up bilateral diagnostic mammogram was not done and has been ordered now.

## 2019-01-28 ENCOUNTER — Telehealth: Payer: Self-pay

## 2019-01-28 DIAGNOSIS — R928 Other abnormal and inconclusive findings on diagnostic imaging of breast: Secondary | ICD-10-CM

## 2019-01-28 NOTE — Telephone Encounter (Signed)
Mammogram ordered corrected.

## 2019-01-29 ENCOUNTER — Other Ambulatory Visit: Payer: Self-pay

## 2019-01-29 ENCOUNTER — Ambulatory Visit (INDEPENDENT_AMBULATORY_CARE_PROVIDER_SITE_OTHER): Payer: Medicare HMO

## 2019-01-29 DIAGNOSIS — I5022 Chronic systolic (congestive) heart failure: Secondary | ICD-10-CM

## 2019-01-29 MED ORDER — PERFLUTREN LIPID MICROSPHERE
1.0000 mL | INTRAVENOUS | Status: AC | PRN
  Administered 2019-01-29: 2 mL via INTRAVENOUS

## 2019-01-30 ENCOUNTER — Telehealth: Payer: Self-pay | Admitting: Cardiovascular Disease

## 2019-01-30 NOTE — Telephone Encounter (Signed)
°*  STAT* If patient is at the pharmacy, call can be transferred to refill team.   1. Which medications need to be refilled? (please list name of each medication and dose if known)    eliquis 5 mg BID   2. Which pharmacy/location (including street and city if local pharmacy) is medication to be sent to? [facility] order   3. Do they need a 30 day or 90 day supply? 90  - patient wants to make sure she is supposed to be still taking Please call to confirm

## 2019-01-31 ENCOUNTER — Telehealth: Payer: Self-pay

## 2019-01-31 MED ORDER — APIXABAN 5 MG PO TABS: 5.0000 mg | ORAL_TABLET | Freq: Two times a day (BID) | ORAL | 1 refills | Status: DC

## 2019-01-31 NOTE — Telephone Encounter (Signed)
-----   Message from Rise Mu, [place] sent at 01/30/2019  6:45 PM EDT ----- Echo showed a low normal pump function, normal pump function of the right side of the heart with a mildly to moderately leaky mitral valve. Compared to prior echo, pump function has improved and is now low normal. Leaky mitral valve can be followed up with an echo in 12 months. Please inquire how the patient is feeling. If there is any residual SOB, chest pain, or fatigue please proceed with a Lexiscan Myoview. If she is feeling well and without symptoms we can defer Myoview at this time and revisit with her when I see her in 02/2019. Please order follow up echo from 01/2020 for mitral regurgitation.

## 2019-01-31 NOTE — Telephone Encounter (Signed)
Please review for refill, Thanks !  

## 2019-01-31 NOTE — Telephone Encounter (Addendum)
Eliquis should be continued. Please let the patient know I have sent to mail order and she should continue to take  Last OV 01/01/2019 Scr 0.85 pm 01/20/2019 73 years old 67kg Eliquis 5mg  BID sent to mail order

## 2019-01-31 NOTE — Telephone Encounter (Signed)
Patient made aware of echo results and Christell Faith, PA recommendation. Patient sts that she is doing well and has no symptoms at this time. Patient states that she has just returned home from a 2 mile walk. Advised the patient to keep her 02/2019 appt. Patient verbalized understanding and voiced appreciation for the call. FYI update fwd to [employer].

## 2019-02-06 ENCOUNTER — Ambulatory Visit: Payer: Medicare HMO

## 2019-02-06 ENCOUNTER — Other Ambulatory Visit: Payer: Self-pay

## 2019-02-06 DIAGNOSIS — G4719 Other hypersomnia: Secondary | ICD-10-CM

## 2019-02-07 ENCOUNTER — Telehealth: Payer: Self-pay | Admitting: Internal Medicine

## 2019-02-07 DIAGNOSIS — G4733 Obstructive sleep apnea (adult) (pediatric): Secondary | ICD-10-CM

## 2019-02-07 NOTE — Telephone Encounter (Signed)
HST preformed 02/07/2019 confirmed moderate OSA with AHI of 16. Recommend auto 5-20cm H2O. Order has been placed, as pt wished to proceed.  Pt will call back once setup to schedule an appt.  Nothing further is needed at this time.

## 2019-02-27 DIAGNOSIS — G4733 Obstructive sleep apnea (adult) (pediatric): Secondary | ICD-10-CM | POA: Diagnosis not present

## 2019-02-28 NOTE — Progress Notes (Signed)
Cardiology Office Note    Date:  03/04/2019   ID:  Melissa Bonilla, DOB [birth year], MRN [id number]  PCP:  Crecencio Mc, MD  Cardiologist:  Nelva Bush, MD  Electrophysiologist:  None   Chief Complaint: Follow up  History of Present Illness:   Melissa Bonilla is a 73 y.o. female with history of recently diagnosed A. fib status post briefly successful cardioversion on 11/28/2018 on Eliquis as detailed below,HFrEF,hypertension, hyperlipidemia, depression, and remote tobacco abuse who presents for follow up of A. fib.  Patient was admitted in early 11/2018 with new onset Afib with RVR without any previously known cardiac history though did report undergoing a stress test approximately 20 years ago. She had reported upper chest discomfort that began 2 days prior to her admission and was worse with deep inspiration. There is also increased shortness of breath. In the ED, she was noted to be in A. fib with RVR. CT chest showed bilateral groundglass opacities as well as consolidation in the right upper lobe concerning for pneumonia. COVID-19 test was negative. Cardiac enzymes were negative. Echo on 11/26/2018 showed an EF of 35 to 40%, diffuse LV hypokinesis, normal RV systolic function, mildly to moderately dilated left atrium, mildly dilated right atrium, moderate mitral regurgitation, mild to moderate tricuspid regurgitation, tricuspid aortic valve, normal size and structure of aortic root. She was gently diuresed and underwent TEE guided cardioversion on 11/28/2018 with TEE showing an EF of 40 to 45%. Upon insertion of the echo probe the patient developed an uncontrolled cough felt to be secondary to laryngeal spasm leading to inability to perform a complete study though it was able to be determined that there was no evidence of thrombus. She underwent successful cardioversion on 6/11 and following this procedure she was noted to have frequent PVCs and what seemed to be a run of atrial  flutter with 2-1 AV block with heart rate of 160 bpm and lasted for approximately 1 to 2 minutes. In this setting, she was given a bolus of IV amiodarone and had a hypotensive episode which was successfully treated with 1 L normal saline bolus and 100 mcg of phenylephrine. Unfortunately, she was back in A. fib with RVR on 6/12. She was loaded with amiodarone and started on digoxin for rate control with plans for repeat cardioversion as an outpatient.  Follow-up digoxin level on 12/04/2018 was noted to be elevated at 2.2 with recommendation to discontinue this medication.  She was seen in the office on 12/18/2018 and was doing well.  Her weight remained stable.  She denies any symptoms.  She remained in rate controlled A. fib.  Her amiodarone was decreased to 200 mg twice daily.  She was continued on Lopressor 50 mg twice daily.  She was scheduled for repeat cardioversion on 12/23/2018 though notified our answering service on 7/3 that she was bradycardic with heart rates in the 40s bpm with stable BP.  She presented for scheduled cardioversion on 7/6 and was noted to be in sinus bradycardia with heart rate of 43 bpm leading to cancellation of cardioversion.  Her metoprolol was held and she was continued on amiodarone 200 mg once daily.  She was most recently seen in the office on 01/01/2019 and was doing well from a cardiac perspective.  She rarely noted several second lasting palpitations first thing in the morning.  She was compliant with anticoagulation.  Since discontinuing beta-blocker she felt her energy has significantly improved.  She reported stable weight.  She  was maintaining sinus rhythm with a mildly bradycardic heart rate.  Her amiodarone was decreased to 100 mg daily.  Given her cardiomyopathy and mildly elevated BP she was started on losartan 12.5 mg daily.  Following restoration of sinus rhythm she underwent repeat limited echo on 01/29/2019 which showed an improved LV systolic function of 50 to [id number] with  diastolic dysfunction, normal RV systolic function and cavity size, mild to moderate mitral regurgitation.  Given she was doing well without any symptoms concerning for angina and able to ambulate for several miles further ischemic evaluation was deferred at that time.  She comes in doing very well today.  She denies any chest pain, shortness of breath, palpitations, dizziness, presyncope, or syncope.  No lower extremity swelling, abdominal distention, orthopnea, PND, early satiety.  No falls, BRBPR, or melena.  Blood pressure typically runs in the [id number] to [id number] systolic.  She continues to walk anywhere from 2 to 4 miles each morning without any symptoms concerning for angina or recurrence of A. fib.  She has only needed as needed Lasix 1 time since she was last seen and this was in the setting of eating a fair amount of chips and salsa.  She is very pleased with her improvement and does not have any issues or concerns today.    Labs: 01/2019 - potassium 4.1, serum creatinine 0.5 12/18/2018 - Digoxin less than 0.4, Hgb 14.9, PLT 199, albumin 4.1, AST/LT normal 11/30/2018 -Magnesium 1.8 11/26/2018 -TSH normal, LDL 95  Past Medical History:  Diagnosis Date   Arrhythmia    atrial fibrillation   CHF (congestive heart failure) ([place])    Depression    Hyperlipidemia    Menopausal syndrome (hot flashes) 1992   used HRT fo 2 yrs    Obstructive sleep apnea    Persistent atrial fibrillation     Past Surgical History:  Procedure Laterality Date   APPENDECTOMY  1972   CARDIOVERSION N/A 11/28/2018   Procedure: CARDIOVERSION;  Surgeon: [name], MD;  Location: [facility] ORS;  Service: Cardiovascular;  Laterality: N/A;   CARDIOVERSION N/A 12/23/2018   Procedure: CARDIOVERSION;  Surgeon: [name], MD;  Location: [facility] ORS;  Service: Cardiovascular;  Laterality: N/A;   [place]   TEE WITHOUT CARDIOVERSION N/A 11/28/2018   Procedure: TRANSESOPHAGEAL  ECHOCARDIOGRAM (TEE);  Surgeon: [name], MD;  Location: [facility] ORS;  Service: Cardiovascular;  Laterality: N/A;    Current Medications: Current Meds  Medication Sig   amiodarone (PACERONE) 100 MG tablet Take 1 tablet (100 mg total) by mouth daily.   apixaban (ELIQUIS) 5 MG TABS tablet Take 1 tablet (5 mg total) by mouth 2 (two) times daily.   Cholecalciferol (VITAMIN D3) 1000 units CAPS Take 1,000 Units by mouth daily.   escitalopram (LEXAPRO) 10 MG tablet Take 1 tablet (10 mg total) by mouth daily.   furosemide (LASIX) 20 MG tablet Take 20 mg by mouth. Take 1 tablet (20MG ) by mouth AS NEEDED   losartan (COZAAR) 25 MG tablet Take 0.5 tablets (12.5 mg total) by mouth daily.   Multiple Vitamin (MULTIVITAMIN WITH MINERALS) TABS tablet Take 1 tablet by mouth daily.   vitamin B-12 (CYANOCOBALAMIN) 1000 MCG tablet Take 1,000 mcg by mouth daily.   vitamin C (ASCORBIC ACID) 250 MG tablet Take 250 mg by mouth daily.    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 [employer]  Social Needs   Financial resource strain: Not on file   Food insecurity    Worry: Not on file    Inability: Not on file   Transportation needs    Medical: Not on file    Non-medical: Not on file  Tobacco Use   Smoking status: Former Smoker    Packs/day: 0.25    Years: 15.00    Pack years: 3.75    Types: Cigarettes    Quit date: 1992    Years since quitting: 28.7   Smokeless tobacco: Never Used  Substance and Sexual Activity   Alcohol use: Yes   Drug use: No   Sexual activity: Not on file  Lifestyle   Physical activity    Days per week: Not on file    Minutes per session: Not on file   Stress:  Not on file  Relationships   Social connections    Talks on phone: Not on file    Gets together: Not on file    Attends religious service: Not on file    Active member of club or organization: Not on file    Attends meetings of clubs or organizations: Not on file    Relationship status: Not on file  Other Topics Concern   Not on file  Social History Narrative   Not on file     Family History:  The patient's family history includes Arthritis in her maternal grandmother and mother; Breast cancer (age of onset: 7) in her paternal aunt; Breast cancer (age of onset: 106) in her paternal aunt; Heart disease in her mother; Heart disease (age of onset: 67) in her father; Hypertension in her maternal grandmother and mother; Stroke in her maternal grandmother.  ROS:   Review of Systems  Constitutional: Negative for chills, diaphoresis, fever, malaise/fatigue and weight loss.  HENT: Negative for congestion.   Eyes: Negative for discharge and redness.  Respiratory: Negative for cough, hemoptysis, sputum production, shortness of breath and wheezing.   Cardiovascular: Negative for chest pain, palpitations, orthopnea, claudication, leg swelling and PND.  Gastrointestinal: Negative for abdominal pain, blood in stool, heartburn, melena, nausea and vomiting.  Genitourinary: Negative for hematuria.  Musculoskeletal: Negative for falls and myalgias.  Skin: Negative for rash.  Neurological: Negative for dizziness, tingling, tremors, sensory change, speech change, focal weakness, loss of consciousness and weakness.  Endo/Heme/Allergies: Does not bruise/bleed easily.  Psychiatric/Behavioral: Negative for substance abuse. The patient is not nervous/anxious.   All other systems reviewed and are negative.    EKGs/Labs/Other Studies Reviewed:    Studies reviewed were summarized above. The additional studies were reviewed today:  Limited echo 01/30/2019: 1. The left ventricle has low normal systolic  function, with an ejection fraction of 50-55%. The cavity size was normal. Left ventricular diastolic Doppler parameters are consistent with impaired relaxation.  2. The right ventricle has normal systolc function. The cavity was normal. There is no increase in right ventricular wall thickness. Unable to estimate RVSP  3. Mitral valve regurgitation is mild to moderate by color flow Doppler. __________  TEE 11/2018: 1. The left ventricle has mild-moderately reduced systolic function, with an ejection fraction of 40-45%. The cavity size was normal. Left ventricular diastolic function could not be  evaluated.  2. The right ventricle has normal systolc function. The cavity was normal. There is no increase in right ventricular wall thickness.  3. Left atrial size was mildly dilated.  4. The mitral valve is grossly normal.  5. The tricuspid valve was grossly normal.  6. No intracardiac thrombi or masses were visualized.  7. The patient developed uncontrolled cough few minutes after the probe was inserted due to suspected laryngeal spasm. Thus, a complete study was not performed but I was able to determine that there was no evidence of thrombus. __________  2D echo 11/2018:  1. The left ventricle has moderately reduced systolic function, with an ejection fraction of 35-40%. The cavity size was normal. Left ventricular diastolic function could not be evaluated secondary to atrial fibrillation. Left ventricular diffuse  hypokinesis.  2. The right ventricle has normal systolic function. The cavity was normal. There is mildly increased right ventricular wall thickness.  3. Left atrial size was mild-moderately dilated.  4. Right atrial size was mildly dilated.  5. The mitral valve was not well visualized. Mitral valve regurgitation is moderate by color flow Doppler.  6. The tricuspid valve is not well visualized. Tricuspid valve regurgitation is mild-moderate.  7. The aortic valve is tricuspid. Mild  thickening of the aortic valve.  8. The aortic root is normal in size and structure.  9. The inferior vena cava was normal in size with <50% respiratory variability.   EKG:  EKG is ordered today.  The EKG ordered today demonstrates NSR, 60 bpm, poor R wave progression along the precordial leads, nonspecific anterolateral ST-T changes (grossly unchanged from prior)  Recent Labs: 11/26/2018: TSH 1.224 11/30/2018: Magnesium 1.8 12/18/2018: ALT 25 01/01/2019: Hemoglobin 14.9; Platelets 199 01/20/2019: BUN 21; Creatinine, Ser 0.85; Potassium 4.1; Sodium 139  Recent Lipid Panel    Component Value Date/Time   CHOL 145 11/26/2018 0430   TRIG 54 11/26/2018 0430   HDL 39 (L) 11/26/2018 0430   CHOLHDL 3.7 11/26/2018 0430   VLDL 11 11/26/2018 0430   LDLCALC 95 11/26/2018 0430   LDLDIRECT 175.5 03/19/2013 0930    PHYSICAL EXAM:    VS:  BP 130/60 (BP Location: Left Arm, Patient Position: Sitting, Cuff Size: Normal)    Pulse 60    Ht 5' (1.524 m)    Wt 150 lb 8 oz (68.3 kg)    BMI 29.39 kg/m   BMI: Body mass index is 29.39 kg/m.  Physical Exam  Constitutional: She is oriented to person, place, and time. She appears well-developed and well-nourished.  HENT:  Head: Normocephalic and atraumatic.  Eyes: Right eye exhibits no discharge. Left eye exhibits no discharge.  Neck: Normal range of motion. No JVD present.  Cardiovascular: Normal rate, regular rhythm, S1 normal, S2 normal and normal heart sounds. Exam reveals no distant heart sounds, no friction rub, no midsystolic click and no opening snap.  No murmur heard. Pulses:      Posterior tibial pulses are 2+ on the right side and 2+ on the left side.  Pulmonary/Chest: Effort normal and breath sounds normal. No respiratory distress. She has no decreased breath sounds. She has no wheezes. She has no rales. She exhibits no tenderness.  Abdominal: Soft. She exhibits no distension. There is no abdominal tenderness.  Musculoskeletal:        General: No  edema.  Neurological: She is alert and oriented to person, place, and time.  Skin: Skin is warm and dry. No cyanosis. Nails show no clubbing.  Psychiatric:  She has a normal mood and affect. Her speech is normal and behavior is normal. Judgment and thought content normal.    Wt Readings from Last 3 Encounters:  03/04/19 150 lb 8 oz (68.3 kg)  03/03/19 148 lb 6 oz (67.3 kg)  01/16/19 148 lb 6.4 oz (67.3 kg)     ASSESSMENT & PLAN:   1. Persistent A. fib/flutter: She is maintaining sinus rhythm with a heart rate of 60 bpm.  She was intolerant to beta-blocker therapy secondary to significant bradycardia and fatigue.  With discontinuation of beta-blocker her energy has significantly improved.  She is ambulating anywhere from 2 to 4 miles on a daily basis without limitation.  We have subsequently decreased her amiodarone down to 100 mg daily given ongoing bradycardia.  With this she has maintained sinus rhythm.  We have ultimately decided to discontinue amiodarone therapy today.  She will keep an eye on her heart rates over the next several months as this medicine washes out of her system and if she notes heart rates consistently greater than 70 bpm she will let us know and we will likely start her on diltiazem for rate control.  She will continue Eliquis 5 mg twice daily given a CHADS2VASc of at least 3(HTN, age x 1, female).  Recent CBC demonstrated normal hemoglobin.  No symptoms concerning for bleeding.  2. HFrEF secondary to NICM: Patient has had subsequent normalization of LV systolic function by echo in 01/2019.  She does not have any symptoms concerning for angina.  Her cardiomyopathy was likely in the setting of persistent A. fib with RVR.  She is no longer on beta blockade as outlined above.  Continue losartan.  Given she is able to ambulate 2 to 4 miles on a daily basis without limitation we have agreed to defer further ischemic evaluation at this time.  3. Mitral regurgitation: Asymptomatic.   Monitor with periodic echo.  No murmur noted on exam.  4. Hypertension: Blood pressure is well controlled.  Continue current dose of losartan.  Continue to follow-up with pulmonology for recently diagnosed sleep apnea.  5. Sleep apnea: Recently diagnosed.  She continues to adjust to her CPAP.  She has follow-up with pulmonology on 03/05/2019.  Disposition: F/u with Dr. Saunders Revel or an APP in 6 months, sooner if needed.   Medication Adjustments/Labs and Tests Ordered: Current medicines are reviewed at length with the patient today.  Concerns regarding medicines are outlined above. Medication changes, Labs and Tests ordered today are summarized above and listed in the Patient Instructions accessible in Encounters.   Signed, Christell Faith, PA-C 03/04/2019 2:23 PM     [address], [place] [id number] [phone]

## 2019-03-02 NOTE — Progress Notes (Signed)
Patient ID: Melissa Bonilla, female    DOB: 26-Feb-1946, 73 y.o.   MRN: [id number]  HPI  Melissa Bonilla is a 73 y/o female with a history of atrial fibrillation, hyperlipidemia, obstructive sleep apnea, depression, anxiety and chronic heart failure.   Echo report from 01/29/2019 reviewed and showed an EF of 50-55% with mild to moderate mitral valve regurgitation. Echo report from 11/28/2018 reviewed and showed an EF of 40-45%.  Admitted 11/25/2018 due to atrial fibrillation. Cardiology consult obtained.CT was negative. Developed respiratory distress after cardioversion. Given oxygen and lasix. Discharged after 6 days.   Patient presents for her follow-up visit with a chief complaint of intermittent palpitations. She states this is chronic and will wake up in the morning occasionally with palpitations. She denies shortness of breath, fatigue, cough, chest pain, leg swelling, and dizziness. She weighs herself everyday and her weight fluctuates between 1-2 pounds. She takes her lasix as needed since August and she has only taken it once.   She states she was recently had a sleep study and received a CPAP one week ago. She is having trouble sleeping with the CPAP and has only been using it between 1-2 hours at night.   Past Medical History:  Diagnosis Date  . Arrhythmia    atrial fibrillation  . CHF (congestive heart failure) ([place])   . Depression   . Hyperlipidemia   . Menopausal syndrome (hot flashes) 1992   used HRT fo 2 yrs   . Obstructive sleep apnea   . Persistent atrial fibrillation    Past Surgical History:  Procedure Laterality Date  . APPENDECTOMY  1972  . CARDIOVERSION N/A 11/28/2018   Procedure: CARDIOVERSION;  Surgeon: [name], MD;  Location: [facility] ORS;  Service: Cardiovascular;  Laterality: N/A;  . CARDIOVERSION N/A 12/23/2018   Procedure: CARDIOVERSION;  Surgeon: [name], MD;  Location: [facility] ORS;  Service: Cardiovascular;  Laterality: N/A;  . [place]  . TEE WITHOUT CARDIOVERSION N/A 11/28/2018   Procedure: TRANSESOPHAGEAL ECHOCARDIOGRAM (TEE);  Surgeon: [name], MD;  Location: [facility] ORS;  Service: Cardiovascular;  Laterality: N/A;   Family History  Problem Relation Age of Onset  . Arthritis Mother        Rheumatoid  . Heart disease Mother        Valvular  . Hypertension Mother   . Heart disease Father 38       CABG  . Arthritis Maternal Grandmother        Rheumatoid  . Hypertension Maternal Grandmother   . Stroke Maternal Grandmother   . Breast cancer Paternal Aunt 70  . Breast cancer Paternal Aunt 73   Social History   Tobacco Use  . Smoking status: Former Smoker    Packs/day: 0.25    Years: 15.00    Pack years: 3.75    Types: Cigarettes    Quit date: 1992    Years since quitting: 28.7  . Smokeless tobacco: Never Used  Substance Use Topics  . Alcohol use: Yes   Allergies  Allergen Reactions  . Codeine Nausea Only and Rash  . Morphine Nausea And Vomiting    Pt allergy may be codeine as codeine or morphine were combined on previous health history. Pt reported upset stomach on questionnaire.  . Sulfa Antibiotics Nausea And Vomiting   Prior to Admission medications   Medication Sig Start Date End Date Taking? Authorizing Provider  amiodarone (PACERONE) 100 MG tablet Take 1 tablet (100 mg  total) by mouth daily. 01/01/19  Yes Dunn, Areta Haber, PA-C  apixaban (ELIQUIS) 5 MG TABS tablet Take 1 tablet (5 mg total) by mouth 2 (two) times daily. 01/31/19  Yes [name], MD  Cholecalciferol (VITAMIN D3) 1000 units CAPS Take 1,000 Units by mouth daily.   Yes [provider]  escitalopram (LEXAPRO) 10 MG tablet Take 1 tablet (10 mg total) by mouth daily. 12/18/18  Yes Crecencio Mc, MD  furosemide (LASIX) 20 MG tablet Take 1 tablet (20 mg total) by mouth 2 (two) times daily. Patient taking differently: Take 20 mg by mouth daily as needed.  12/18/18  Yes Dunn, Areta Haber, PA-C  losartan (COZAAR) 25 MG  tablet Take 0.5 tablets (12.5 mg total) by mouth daily. 01/01/19 04/01/19 Yes Dunn, Areta Haber, PA-C  Multiple Vitamin (MULTIVITAMIN WITH MINERALS) TABS tablet Take 1 tablet by mouth daily.   Yes [provider]  vitamin B-12 (CYANOCOBALAMIN) 1000 MCG tablet Take 1,000 mcg by mouth daily.   Yes [provider]  vitamin C (ASCORBIC ACID) 250 MG tablet Take 250 mg by mouth daily.   Yes [provider]    Review of Systems  Constitutional: Negative for appetite change and fatigue.  HENT: Negative for congestion, postnasal drip and sore throat.   Eyes: Negative.   Respiratory: Negative for cough and shortness of breath.   Cardiovascular: Positive for palpitations (at times). Negative for chest pain and leg swelling.  Gastrointestinal: Negative for abdominal distention and abdominal pain.  Endocrine: Negative.   Genitourinary: Negative.   Musculoskeletal: Negative for back pain and neck pain.  Skin: Negative.   Allergic/Immunologic: Negative.   Neurological: Negative for dizziness and light-headedness.  Hematological: Negative for adenopathy. Does not bruise/bleed easily.  Psychiatric/Behavioral: Negative for dysphoric mood and sleep disturbance (started CPAP last week). The patient is not nervous/anxious.    Vitals:   03/03/19 1059  BP: (!) 146/64  Pulse: (!) 56  Resp: 18  SpO2: 96%   Filed Weights   03/03/19 1059  Weight: 148 lb 6 oz (67.3 kg)   Lab Results  Component Value Date   CREATININE 0.85 01/20/2019   CREATININE 0.91 01/01/2019   CREATININE 0.91 12/18/2018    Physical Exam Vitals signs and nursing note reviewed.  Constitutional:      Appearance: Normal appearance.  HENT:     Head: Normocephalic and atraumatic.  Neck:     Musculoskeletal: Normal range of motion and neck supple.  Cardiovascular:     Rate and Rhythm: Normal rate and regular rhythm.  Pulmonary:     Effort: Pulmonary effort is normal. No respiratory distress.     Breath  sounds: No wheezing or rales.  Abdominal:     Palpations: Abdomen is soft.     Tenderness: There is no abdominal tenderness.  Musculoskeletal:        General: No tenderness.     Right lower leg: No edema.     Left lower leg: No edema.  Skin:    General: Skin is warm and dry.  Neurological:     General: No focal deficit present.     Mental Status: She is alert and oriented to person, place, and time.  Psychiatric:        Mood and Affect: Mood normal.        Behavior: Behavior normal.     Assessment & Plan:  1: Chronic heart failure with mildly reduced ejection fraction- - NYHA class I - euvolemic today -  weighing daily and says that her weight has been stable; instructed to call for an overnight weight gain of >2 pounds or a weekly weight gain of >5 pounds - not adding salt to her food and tries to closely follow a low sodium diet - currently asymptomatic and EF >40% so no indication for entresto  2: Atrial fibrillation- - saw cardiology (Dunn) 01/01/2019 - on amiodarone and apixaban  3: HTN- - BP 146/64 - saw PCP Derrel Nip) 01/20/2019 via telemedicine - BMP 01/20/2019 reviewed and showed sodium 139, potassium 4.1, creatinine 0.85 and GFR >60  4: Obstructive sleep apnea- - Educated patient that she could try to use the CPAP during the day when she was relaxing to help try to adjust to the CPAP and can then hopefully use more at night - Saw pulmonology Ashby Dawes) 01/16/19  Medication list reviewed.  Return in 6 months or sooner for any questions/problems before then.

## 2019-03-03 ENCOUNTER — Other Ambulatory Visit: Payer: Self-pay

## 2019-03-03 ENCOUNTER — Encounter: Payer: Self-pay | Admitting: Family

## 2019-03-03 ENCOUNTER — Ambulatory Visit: Payer: Medicare HMO | Attending: Family | Admitting: Family

## 2019-03-03 VITALS — BP 146/64 | HR 56 | Resp 18 | Ht 60.0 in | Wt 148.4 lb

## 2019-03-03 DIAGNOSIS — I5022 Chronic systolic (congestive) heart failure: Secondary | ICD-10-CM | POA: Diagnosis not present

## 2019-03-03 DIAGNOSIS — Z7901 Long term (current) use of anticoagulants: Secondary | ICD-10-CM | POA: Insufficient documentation

## 2019-03-03 DIAGNOSIS — I4819 Other persistent atrial fibrillation: Secondary | ICD-10-CM | POA: Insufficient documentation

## 2019-03-03 DIAGNOSIS — Z885 Allergy status to narcotic agent status: Secondary | ICD-10-CM | POA: Insufficient documentation

## 2019-03-03 DIAGNOSIS — Z87891 Personal history of nicotine dependence: Secondary | ICD-10-CM | POA: Insufficient documentation

## 2019-03-03 DIAGNOSIS — Z882 Allergy status to sulfonamides status: Secondary | ICD-10-CM | POA: Insufficient documentation

## 2019-03-03 DIAGNOSIS — I340 Nonrheumatic mitral (valve) insufficiency: Secondary | ICD-10-CM | POA: Insufficient documentation

## 2019-03-03 DIAGNOSIS — Z8249 Family history of ischemic heart disease and other diseases of the circulatory system: Secondary | ICD-10-CM | POA: Diagnosis not present

## 2019-03-03 DIAGNOSIS — F329 Major depressive disorder, single episode, unspecified: Secondary | ICD-10-CM | POA: Insufficient documentation

## 2019-03-03 DIAGNOSIS — Z79899 Other long term (current) drug therapy: Secondary | ICD-10-CM | POA: Diagnosis not present

## 2019-03-03 DIAGNOSIS — G4733 Obstructive sleep apnea (adult) (pediatric): Secondary | ICD-10-CM | POA: Diagnosis not present

## 2019-03-03 DIAGNOSIS — R002 Palpitations: Secondary | ICD-10-CM | POA: Diagnosis not present

## 2019-03-03 DIAGNOSIS — E785 Hyperlipidemia, unspecified: Secondary | ICD-10-CM | POA: Insufficient documentation

## 2019-03-03 DIAGNOSIS — F419 Anxiety disorder, unspecified: Secondary | ICD-10-CM | POA: Diagnosis not present

## 2019-03-03 DIAGNOSIS — I10 Essential (primary) hypertension: Secondary | ICD-10-CM

## 2019-03-03 DIAGNOSIS — I110 Hypertensive heart disease with heart failure: Secondary | ICD-10-CM | POA: Diagnosis not present

## 2019-03-03 DIAGNOSIS — G4730 Sleep apnea, unspecified: Secondary | ICD-10-CM

## 2019-03-03 NOTE — Patient Instructions (Signed)
Continue weighing daily and call for an overnight weight gain of > 2 pounds or a weekly weight gain of >5 pounds. 

## 2019-03-04 ENCOUNTER — Encounter: Payer: Self-pay | Admitting: Physician Assistant

## 2019-03-04 ENCOUNTER — Ambulatory Visit (INDEPENDENT_AMBULATORY_CARE_PROVIDER_SITE_OTHER): Payer: Medicare HMO | Admitting: Physician Assistant

## 2019-03-04 ENCOUNTER — Other Ambulatory Visit: Payer: Self-pay

## 2019-03-04 VITALS — BP 130/60 | HR 60 | Ht 60.0 in | Wt 150.5 lb

## 2019-03-04 DIAGNOSIS — I4819 Other persistent atrial fibrillation: Secondary | ICD-10-CM

## 2019-03-04 DIAGNOSIS — G4730 Sleep apnea, unspecified: Secondary | ICD-10-CM

## 2019-03-04 DIAGNOSIS — I340 Nonrheumatic mitral (valve) insufficiency: Secondary | ICD-10-CM | POA: Diagnosis not present

## 2019-03-04 DIAGNOSIS — I428 Other cardiomyopathies: Secondary | ICD-10-CM

## 2019-03-04 DIAGNOSIS — I10 Essential (primary) hypertension: Secondary | ICD-10-CM

## 2019-03-04 NOTE — Patient Instructions (Signed)
Medication Instructions:  Your physician has recommended you make the following change in your medication:  1- STOP Amiodarone. 2- Take Furosemide as needed - Take 20 mg by mouth daily as needed for shortness of breath or lower extremity swelling.    If you need a refill on your cardiac medications before your next appointment, please call your pharmacy.   Lab work: - None ordered.   If you have labs (blood work) drawn today and your tests are completely normal, you will receive your results only by: Marland Kitchen MyChart Message (if you have MyChart) OR . A paper copy in the mail If you have any lab test that is abnormal or we need to change your treatment, we will call you to review the results.  Testing/Procedures: - None ordered.   Follow-Up: At [facility], you and your health needs are our priority.  As part of our continuing mission to provide you with exceptional heart care, we have created designated Provider Care Teams.  These Care Teams include your primary Cardiologist (physician) and Advanced Practice Providers (APPs -  Physician Assistants and Nurse Practitioners) who all work together to provide you with the care you need, when you need it. You will need a follow up appointment in 6 months.  Please call our office 2 months in advance to schedule this appointment.  You may see Nelva Bush, MD or one of the following Advanced Practice Providers on your designated Care Team:   Murray Hodgkins, NP Christell Faith, PA-C . Marrianne Mood, PA-C

## 2019-03-06 ENCOUNTER — Ambulatory Visit
Admission: RE | Admit: 2019-03-06 | Discharge: 2019-03-06 | Disposition: A | Discharge status: Home / Self Care | Payer: Medicare HMO | Source: Ambulatory Visit | Attending: Internal Medicine | Admitting: Internal Medicine

## 2019-03-06 DIAGNOSIS — R928 Other abnormal and inconclusive findings on diagnostic imaging of breast: Secondary | ICD-10-CM

## 2019-03-06 DIAGNOSIS — R922 Inconclusive mammogram: Secondary | ICD-10-CM | POA: Diagnosis not present

## 2019-03-29 DIAGNOSIS — G4733 Obstructive sleep apnea (adult) (pediatric): Secondary | ICD-10-CM | POA: Diagnosis not present

## 2019-04-01 ENCOUNTER — Telehealth: Payer: Self-pay

## 2019-04-01 NOTE — Telephone Encounter (Signed)
Copied from [place] [id number]. Topic: Appointment Scheduling - Scheduling Inquiry for Clinic >> Apr 01, 2019 10:39 AM Rayann Heman wrote: Reason for CRM: pt called and stated that she would like a call back so that she can reschedule AWV

## 2019-04-03 ENCOUNTER — Ambulatory Visit: Payer: Medicare HMO | Admitting: Internal Medicine

## 2019-04-03 ENCOUNTER — Ambulatory Visit (INDEPENDENT_AMBULATORY_CARE_PROVIDER_SITE_OTHER): Payer: Medicare HMO | Admitting: Internal Medicine

## 2019-04-03 ENCOUNTER — Encounter: Payer: Self-pay | Admitting: Internal Medicine

## 2019-04-03 ENCOUNTER — Other Ambulatory Visit: Payer: Self-pay

## 2019-04-03 DIAGNOSIS — G4733 Obstructive sleep apnea (adult) (pediatric): Secondary | ICD-10-CM

## 2019-04-03 NOTE — Progress Notes (Signed)
[facility] Consultation      I connected with the patient by telephone enabled telemedicine visit and verified that I am speaking with the correct person using two identifiers.    I discussed the limitations, risks, security and privacy concerns of performing an evaluation and management service by telemedicine and the availability of in-person appointments. I also discussed with the patient that there may be a patient responsible charge related to this service. The patient expressed understanding and agreed to proceed.  PATIENT AGREES AND CONFIRMS -YES   Other persons participating in the visit and their role in the encounter: Patient, nursing  This visit type was conducted due to national recommendations for restrictions regarding the COVID-19 Pandemic (e.g. social distancing).  This format is felt to be most appropriate for this patient at this time.  All issues noted in this document were discussed and addressed.     SYNOPSIS Melissa Bonilla is a 73 y.o. old female with a history of atrial fibrillation as well as heart failure with reduced ejection fraction. She was in the hospital in June with Afib, acute CHF and pneumonia. She would notice at that time she would wake herself up from sleep.  She falls asleep for 30 min if she sits down to read or watching tv late night.  She notes that she does not have dyspnea any more, she is active during the day, and cleans her own home, she does a lot of walking.     Date: 04/03/2019  MRN# [id number] Melissa Bonilla [birth year]  **CT chest 11/25/2018>> imaging personally reviewed, there is a bilateral groundglass changes, suggestive of pneumonitis or pulmonary vascular edema. **Chest x-ray 11/18/2018>> imaging personally reviewed, mild pulmonary vascular edema **Echocardiogram transesophageal 11/28/2018>> EF is 40% **Echocardiogram 11/26/2018>> EF is 35%.   CC follow up OSA  HPI:   Sleep study 02/07/19 Findings shows AHI 16   moderate OSA Diagnosis  Started AUTO CPAP 5-20 cm h20 full face mask AHi down to 2.9 minimal leak  More energy Less fatigued More active   No evidence of heart failure at this time No evidence or signs of infection at this time No respiratory distress No fevers, chills, nausea, vomiting, diarrhea No evidence of lower extremity edema No evidence hemoptysis   PMHX:   Past Medical History:  Diagnosis Date  . Arrhythmia    atrial fibrillation  . CHF (congestive heart failure) ([place])   . Depression   . Hyperlipidemia   . Menopausal syndrome (hot flashes) 1992   used HRT fo 2 yrs   . Obstructive sleep apnea   . Persistent atrial fibrillation    Surgical Hx:  Past Surgical History:  Procedure Laterality Date  . APPENDECTOMY  1972  . CARDIOVERSION N/A 11/28/2018   Procedure: CARDIOVERSION;  Surgeon: [name], MD;  Location: [facility] ORS;  Service: Cardiovascular;  Laterality: N/A;  . CARDIOVERSION N/A 12/23/2018   Procedure: CARDIOVERSION;  Surgeon: [name], MD;  Location: [facility] ORS;  Service: Cardiovascular;  Laterality: N/A;  . [place]  . TEE WITHOUT CARDIOVERSION N/A 11/28/2018   Procedure: TRANSESOPHAGEAL ECHOCARDIOGRAM (TEE);  Surgeon: [name], MD;  Location: [facility] ORS;  Service: Cardiovascular;  Laterality: N/A;   Family Hx:  Family History  Problem Relation Age of Onset  . Arthritis Mother        Rheumatoid  . Heart disease Mother        Valvular  . Hypertension Mother   .  Heart disease Father 19       CABG  . Arthritis Maternal Grandmother        Rheumatoid  . Hypertension Maternal Grandmother   . Stroke Maternal Grandmother   . Breast cancer Paternal Aunt 66  . Breast cancer Paternal Aunt 74   Social Hx:   Social History   Tobacco Use  . Smoking status: Former Smoker    Packs/day: 0.25    Years: 15.00    Pack years: 3.75    Types: Cigarettes    Quit date: 1992    Years since quitting: 28.8  .  Smokeless tobacco: Never Used  Substance Use Topics  . Alcohol use: Yes  . Drug use: No   Medication:    Current Outpatient Medications:  .  apixaban (ELIQUIS) 5 MG TABS tablet, Take 1 tablet (5 mg total) by mouth 2 (two) times daily., Disp: 180 tablet, Rfl: 1 .  Cholecalciferol (VITAMIN D3) 1000 units CAPS, Take 1,000 Units by mouth daily., Disp: , Rfl:  .  escitalopram (LEXAPRO) 10 MG tablet, Take 1 tablet (10 mg total) by mouth daily., Disp: 90 tablet, Rfl: 1 .  furosemide (LASIX) 20 MG tablet, Take 20 mg by mouth daily as needed for shortness of breath or edema.  , Disp: , Rfl:  .  losartan (COZAAR) 25 MG tablet, Take 0.5 tablets (12.5 mg total) by mouth daily., Disp: 45 tablet, Rfl: 3 .  Multiple Vitamin (MULTIVITAMIN WITH MINERALS) TABS tablet, Take 1 tablet by mouth daily., Disp: , Rfl:  .  vitamin B-12 (CYANOCOBALAMIN) 1000 MCG tablet, Take 1,000 mcg by mouth daily., Disp: , Rfl:  .  vitamin C (ASCORBIC ACID) 250 MG tablet, Take 250 mg by mouth daily., Disp: , Rfl:    Allergies:  Codeine, Morphine, and Sulfa antibiotics   Review of Systems:  Gen:  Denies  fever, sweats, chills weight loss  HEENT: Denies blurred vision, double vision, ear pain, eye pain, hearing loss, nose bleeds, sore throat Cardiac:  No dizziness, chest pain or heaviness, chest tightness,edema, No JVD Resp:   No cough, -sputum production, -shortness of breath,-wheezing, -hemoptysis,  Gi: Denies swallowing difficulty, stomach pain, nausea or vomiting, diarrhea, constipation, bowel incontinence Gu:  Denies bladder incontinence, burning urine Ext:   Denies Joint pain, stiffness or swelling Skin: Denies  skin rash, easy bruising or bleeding or hives Endoc:  Denies polyuria, polydipsia , polyphagia or weight change Psych:   Denies depression, insomnia or hallucinations  Other:  All other systems negative     Assessment and Plan:  Excessive daytime sleepiness Moderate OSA AHI 16 Excellent compliance  report-93% days and 87% >4 hrs Patient is using and greatly benefitting from therapy Well controlled OSA at this time continue autoCPAP as prescribed   Atrial fibrillation, history of congestive heart failure. Sleeps apnea can contribure to CHf, Afib therefore treatment of sleep apnea is a very important part of management   COVID-19 EDUCATION: The signs and symptoms of COVID-19 were discussed with the patient and how to seek care for testing.  The importance of social distancing was discussed today. Hand Washing Techniques and avoid touching face was advised.     MEDICATION ADJUSTMENTS/LABS AND TESTS ORDERED: Continue CPAP as prescribed   CURRENT MEDICATIONS REVIEWED AT LENGTH WITH PATIENT TODAY   Patient satisfied with Plan of action and management. All questions answered  Follow up in 6 months  Total Time spent 23 minutes  Corrin Parker, M.D.  [facility] Pulmonary & Critical Care  Medicine  Medical Director [place] Director [facility] Cardio-Pulmonary Department

## 2019-04-03 NOTE — Patient Instructions (Signed)
Continue CPAP as prescribed. 

## 2019-04-09 ENCOUNTER — Other Ambulatory Visit: Payer: Self-pay

## 2019-04-09 ENCOUNTER — Ambulatory Visit (INDEPENDENT_AMBULATORY_CARE_PROVIDER_SITE_OTHER): Payer: Medicare HMO

## 2019-04-09 VITALS — BP 137/54 | HR 58 | Wt 148.0 lb

## 2019-04-09 DIAGNOSIS — Z780 Asymptomatic menopausal state: Secondary | ICD-10-CM

## 2019-04-09 DIAGNOSIS — Z0000 Encounter for general adult medical examination without abnormal findings: Secondary | ICD-10-CM

## 2019-04-09 NOTE — Patient Instructions (Addendum)
Melissa Bonilla , Thank you for taking time to come for your Medicare Wellness Visit. I appreciate your ongoing commitment to your health goals. Please review the following plan we discussed and let me know if I can assist you in the future.   These are the goals we discussed: Goals    . Follow up with Primary Care Provider     As needed       This is a list of the screening recommended for you and due dates:  Health Maintenance  Topic Date Due  . DEXA scan (bone density measurement)  03/14/2011  . Flu Shot  09/17/2019*  . Tetanus Vaccine  04/08/2020*  . Mammogram  03/05/2021  . Colon Cancer Screening  08/10/2021  .  Hepatitis C: One time screening is recommended by Center for Disease Control  (CDC) for  adults born from [birth year] through 1965.   Completed  . Pneumonia vaccines  Completed  *Topic was postponed. The date shown is not the original due date.    Bone Density Test The bone density test uses a special type of X-ray to measure the amount of calcium and other minerals in your bones. It can measure bone density in the hip and the spine. The test procedure is similar to having a regular X-ray. This test may also be called:  Bone densitometry.  Bone mineral density test.  Dual-energy X-ray absorptiometry (DEXA). You may have this test to:  Diagnose a condition that causes weak or thin bones (osteoporosis).  Screen you for osteoporosis.  Predict your risk for a broken bone (fracture).  Determine how well your osteoporosis treatment is working. Tell a health care provider about:  Any allergies you have.  All medicines you are taking, including vitamins, herbs, eye drops, creams, and over-the-counter medicines.  Any problems you or family members have had with anesthetic medicines.  Any blood disorders you have.  Any surgeries you have had.  Any medical conditions you have.  Whether you are pregnant or may be pregnant.  Any medical tests you have had within the  past 14 days that used contrast material. What are the risks? Generally, this is a safe procedure. However, it does expose you to a small amount of radiation, which can slightly increase your cancer risk. What happens before the procedure?  Do not take any calcium supplements starting 24 hours before your test.  Remove all metal jewelry, eyeglasses, dental appliances, and any other metal objects. What happens during the procedure?   You will lie down on an exam table. There will be an X-ray generator below you and an imaging device above you.  Other devices, such as boxes or braces, may be used to position your body properly for the scan.  The machine will slowly scan your body. You will need to keep still.  The images will show up on a screen in the room. Images will be examined by a specialist after your test is done. The procedure may vary among health care providers and hospitals. What happens after the procedure?  It is up to you to get your test results. Ask your health care provider, or the department that is doing the test, when your results will be ready. Summary  A bone density test is an imaging test that uses a type of X-ray to measure the amount of calcium and other minerals in your bones.  The test may be used to diagnose or screen you for a condition that causes weak or  thin bones (osteoporosis), predict your risk for a broken bone (fracture), or determine how well your osteoporosis treatment is working.  Do not take any calcium supplements starting 24 hours before your test.  Ask your health care provider, or the department that is doing the test, when your results will be ready. This information is not intended to replace advice given to you by your health care provider. Make sure you discuss any questions you have with your health care provider. Document Released: 06/27/2004 Document Revised: 06/21/2017 Document Reviewed: 04/09/2017 Elsevier Patient Education  2020  [employer].

## 2019-04-09 NOTE — Progress Notes (Addendum)
Subjective:   Melissa Bonilla is a 73 y.o. female who presents for Medicare Annual (Subsequent) preventive examination.  Review of Systems:  No ROS.  Medicare Wellness Virtual Visit.  Visual/audio telehealth visit, UTA vital signs.   See social history for additional risk factors.   Cardiac Risk Factors include: advanced age (>20men, >18 women)     Objective:     Vitals: BP (!) 137/54 (BP Location: Left Arm, Patient Position: Sitting, Cuff Size: Normal)   Pulse (!) 58   Wt 148 lb (67.1 kg)   BMI 28.90 kg/m   Body mass index is 28.9 kg/m.  Advanced Directives 04/09/2019 12/23/2018 11/28/2018 11/25/2018 11/25/2018  Does Patient Have a Medical Advance Directive? Yes Yes No Yes No  Type of Advance Directive Living will Living will - Living will -  Does patient want to make changes to medical advance directive? No - Patient declined No - Patient declined - No - Patient declined -  Would patient like information on creating a medical advance directive? - - No - Patient declined - -    Tobacco Social History   Tobacco Use  Smoking Status Former Smoker  . Packs/day: 0.25  . Years: 15.00  . Pack years: 3.75  . Types: Cigarettes  . Quit date: 25  . Years since quitting: 28.8  Smokeless Tobacco Never Used     Counseling given: Not Answered   Clinical Intake:  Pre-visit preparation completed: Yes        Diabetes: No  How often do you need to have someone help you when you read instructions, pamphlets, or other written materials from your doctor or pharmacy?: 1 - Never  Interpreter Needed?: No     Past Medical History:  Diagnosis Date  . Arrhythmia    atrial fibrillation  . CHF (congestive heart failure) ([place])   . Depression   . Hyperlipidemia   . Menopausal syndrome (hot flashes) 1992   used HRT fo 2 yrs   . Obstructive sleep apnea   . Persistent atrial fibrillation [facility])    Past Surgical History:  Procedure Laterality Date  . APPENDECTOMY  1972  .  CARDIOVERSION N/A 11/28/2018   Procedure: CARDIOVERSION;  Surgeon: [name], MD;  Location: [facility] ORS;  Service: Cardiovascular;  Laterality: N/A;  . CARDIOVERSION N/A 12/23/2018   Procedure: CARDIOVERSION;  Surgeon: [name], MD;  Location: [facility] ORS;  Service: Cardiovascular;  Laterality: N/A;  . [place]  . TEE WITHOUT CARDIOVERSION N/A 11/28/2018   Procedure: TRANSESOPHAGEAL ECHOCARDIOGRAM (TEE);  Surgeon: [name], MD;  Location: [facility] ORS;  Service: Cardiovascular;  Laterality: N/A;   Family History  Problem Relation Age of Onset  . Arthritis Mother        Rheumatoid  . Heart disease Mother        Valvular  . Hypertension Mother   . Heart disease Father 48       CABG  . Arthritis Maternal Grandmother        Rheumatoid  . Hypertension Maternal Grandmother   . Stroke Maternal Grandmother   . Breast cancer Paternal Aunt 6  . Breast cancer Paternal Aunt 37   Social History   Socioeconomic History  . Marital status: Married    Spouse name: Not on file  . Number of children: Not on file  . Years of education: Not on file  . 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 Financial resource strain: Not hard at all  . Food insecurity    Worry: Never true    Inability: Never true  . Transportation needs    Medical: No    Non-medical: No  Tobacco Use  . Smoking status: Former Smoker    Packs/day: 0.25    Years: 15.00    Pack years: 3.75    Types: Cigarettes    Quit date: 1992    Years since quitting: 28.8  . Smokeless tobacco: Never Used  Substance and Sexual Activity  . Alcohol use: Yes  . Drug use: No  . Sexual activity: Not on file  Lifestyle  . Physical activity    Days per week: Not on file    Minutes per session: Not on file  . Stress: Not at all  Relationships  . Social Herbalist on phone: Not on file    Gets  together: Not on file    Attends religious service: Not on file    Active member of club or organization: Not on file    Attends meetings of clubs or organizations: Not on file    Relationship status: Not on file  Other Topics Concern  . Not on file  Social History Narrative  . Not on file    Outpatient Encounter Medications as of 04/09/2019  Medication Sig  . apixaban (ELIQUIS) 5 MG TABS tablet Take 1 tablet (5 mg total) by mouth 2 (two) times daily.  . Cholecalciferol (VITAMIN D3) 1000 units CAPS Take 1,000 Units by mouth daily.  Marland Kitchen escitalopram (LEXAPRO) 10 MG tablet Take 1 tablet (10 mg total) by mouth daily.  . furosemide (LASIX) 20 MG tablet Take 20 mg by mouth daily as needed for shortness of breath or edema.    . Multiple Vitamin (MULTIVITAMIN WITH MINERALS) TABS tablet Take 1 tablet by mouth daily.  . vitamin B-12 (CYANOCOBALAMIN) 1000 MCG tablet Take 1,000 mcg by mouth daily.  . vitamin C (ASCORBIC ACID) 250 MG tablet Take 250 mg by mouth daily.  Marland Kitchen losartan (COZAAR) 25 MG tablet Take 0.5 tablets (12.5 mg total) by mouth daily.   No facility-administered encounter medications on file as of 04/09/2019.     Activities of Daily Living In your present state of health, do you have any difficulty performing the following activities: 04/09/2019 03/03/2019  Hearing? N N  Vision? N N  Difficulty concentrating or making decisions? N N  Walking or climbing stairs? N N  Dressing or bathing? N N  Doing errands, shopping? N N  Preparing Food and eating ? N -  Using the Toilet? N -  In the past six months, have you accidently leaked urine? N -  Do you have problems with loss of bowel control? N -  Managing your Medications? N -  Managing your Finances? N -  Housekeeping or managing your Housekeeping? N -  Some recent data might be hidden    [name], MD as PCP - General (Internal Medicine) End, [name], MD as PCP - Cardiology (Cardiology) [name], MD as Consulting Physician (Cardiology)    Assessment:   This is a routine wellness examination for [name].  I connected with patient 04/09/19 at  1:00 PM EDT by an audio enabled telemedicine application and verified that I am speaking with the correct person using two identifiers. Patient stated full name  and DOB. Patient gave permission to continue with virtual visit. Patient's location was at home and Nurse's location was at [place] office.   Health Maintenance Due: -Dexa Scan- ordered Update all pending maintenance due as appropriate.   See completed HM at the end of note.   Eye: Visual acuity not assessed. Virtual visit. Wears corrective lenses. Followed by their ophthalmologist every 12 months.   Dental: Visits every 6 months.    Hearing: Demonstrates normal hearing during visit.  Safety:  Patient feels safe at home- yes Patient does have smoke detectors at home- yes Patient does wear sunscreen or protective clothing when in direct sunlight - yes Patient does wear seat belt when in a moving vehicle - yes Patient drives- yes Adequate lighting in walkways free from debris- yes Grab bars and handrails used as appropriate- yes Ambulates with no assistive device Cell phone on person when ambulating outside of the home- yes  Social: Alcohol intake - yes      Smoking history- former   Smokers in home? none Illicit drug use? none  Depression: PHQ 2 &9 complete. See screening below. Denies irritability, anhedonia, sadness/tearfullness.    Falls: See screening below.    Medication: Taking as directed and without issues.   Covid-19: Precautions and sickness symptoms discussed. Wears mask, social distancing, hand hygiene as appropriate.   Activities of Daily Living Patient denies needing assistance with: household chores, feeding themselves, getting from bed to chair, getting to the toilet, bathing/showering, dressing, managing money, or preparing meals.    Memory: Patient is alert. Patient denies difficulty focusing or concentrating. Correctly identified the president of the [place], season and recall 2/3. Patient likes to read for brain stimulation.  BMI- discussed the importance of a healthy diet, water intake and the benefits of aerobic exercise.  Educational material provided.  Physical activity- walking 1.5-3 miles daily  Diet: healthy Water: good intake Caffeine: 1 cup twice a week  Other Providers [name], MD as PCP - General (Internal Medicine) End, [name], MD as PCP - Cardiology (Cardiology) [name], MD as Consulting Physician (Cardiology)  Exercise Activities and Dietary recommendations Current Exercise Habits: Home exercise routine, Type of exercise: walking, Time (Minutes): 40, Intensity: Mild  Goals    . Follow up with Primary Care Provider     As needed       Fall Risk Fall Risk  03/03/2019 12/31/2018 09/02/2015 05/26/2013  Falls in the past year? 0 0 No No  Number falls in past yr: 0 0 - -  Injury with Fall? 0 0 - -   Timed Get Up and Go performed: no, virtual visit  Depression Screen PHQ 2/9 Scores 04/09/2019 09/02/2015 05/26/2013  PHQ - 2 Score 0 0 2  PHQ- 9 Score - - 6     Cognitive Function     6CIT Screen 04/09/2019  What Year? 0 points  What month? 0 points  What time? 0 points  Count back from 20 0 points  Months in reverse 0 points  Repeat phrase 0 points  Total Score 0    Immunization History  Administered Date(s) Administered  . Influenza Split 05/16/2013  . Influenza,inj,Quad PF,6+ Mos 03/05/2015  . Influenza-Unspecified 04/19/2012  . Pneumococcal Conjugate-13 01/12/2014  . Pneumococcal Polysaccharide-23 03/05/2015  . Zoster 11/21/2011   Screening Tests Health Maintenance  Topic Date Due  . DEXA SCAN  03/14/2011  . INFLUENZA VACCINE  09/17/2019 (Originally 01/18/2019)  . TETANUS/TDAP  04/08/2020 (Originally 03/13/1965)  .  MAMMOGRAM  03/05/2021   . COLONOSCOPY  08/10/2021  . Hepatitis C Screening  Completed  . PNA vac Low Risk Adult  Completed       Plan:   Keep all routine maintenance appointments.   Medicare Attestation I have personally reviewed: The patient's medical and social history Their use of alcohol, tobacco or illicit drugs Their current medications and supplements The patient's functional ability including ADLs,fall risks, home safety risks, cognitive, and hearing and visual impairment Diet and physical activities Evidence for depression    In addition, I have reviewed and discussed with patient certain preventive protocols, quality metrics, and best practice recommendations. A written personalized care plan for preventive services as well as general preventive health recommendations were provided to patient via mail.     OBrien-Blaney, Denisa L, LPN  [phone]    I have reviewed the above information and agree with above.   Deborra Medina, MD

## 2019-04-18 ENCOUNTER — Encounter: Payer: Self-pay | Admitting: Internal Medicine

## 2019-04-29 DIAGNOSIS — G4733 Obstructive sleep apnea (adult) (pediatric): Secondary | ICD-10-CM | POA: Diagnosis not present

## 2019-05-21 ENCOUNTER — Other Ambulatory Visit: Payer: Self-pay | Admitting: Internal Medicine

## 2019-05-21 NOTE — Telephone Encounter (Signed)
°*  STAT* If patient is at the pharmacy, call can be transferred to refill team.   1. Which medications need to be refilled? (please list name of each medication and dose if known) Eliquis 5 MG  2. Which pharmacy/location (including street and city if local pharmacy) is medication to be sent to? [facility] in [place]   3. Do they need a 30 day or 90 day supply? Is waiting on a mail order - just would like a week supply sent - only has 1 pill left

## 2019-05-22 ENCOUNTER — Telehealth: Payer: Self-pay | Admitting: Physician Assistant

## 2019-05-22 MED ORDER — APIXABAN 5 MG PO TABS: 5.0000 mg | ORAL_TABLET | Freq: Two times a day (BID) | ORAL | 0 refills | Status: DC

## 2019-05-22 NOTE — Telephone Encounter (Signed)
Fwd to coumadin clinic to expedite.  Patient is out of meds .

## 2019-05-22 NOTE — Telephone Encounter (Signed)
Called patient. She is in a dilemma. She ordered her monthly supply of Eliquis from [facility] on 05/07/19 which at that time she had about 2 weeks supply left. However, the Eliquis has not arrived yet and [employer] nor [employer] know where it is or when it will arrive. She s/w [employer] yesterday and they will send her a 1 week supply that will arrive in about 5 days. Until then she is out and [facility] would charge $100 for 7 days worth. Patient requesting samples to bridge the gap. Advised I will have 1 week of samples available at the front desk for her to pick up tomorrow. She lives in [place] and can't get here this evening before we close.  Medication Samples have been provided to the patient.  Drug name: Eliquis       Strength: 5 mg        Qty: 1 box  LOT: [id number]  Exp.Date: Dec 2022

## 2019-05-22 NOTE — Telephone Encounter (Signed)
Patient states she is completely out of Eliquis and needs some samples.

## 2019-05-22 NOTE — Telephone Encounter (Signed)
Eliquis refilled on 01/31/19: Last OV 01/01/2019 Scr 0.85 pm 01/20/2019 73 years old 73kg [facility] [place] [facility] while awaiting mailorder.

## 2019-05-29 DIAGNOSIS — G4733 Obstructive sleep apnea (adult) (pediatric): Secondary | ICD-10-CM | POA: Diagnosis not present

## 2019-06-15 DIAGNOSIS — G4733 Obstructive sleep apnea (adult) (pediatric): Secondary | ICD-10-CM | POA: Diagnosis not present

## 2019-06-29 DIAGNOSIS — G4733 Obstructive sleep apnea (adult) (pediatric): Secondary | ICD-10-CM | POA: Diagnosis not present

## 2019-07-01 ENCOUNTER — Other Ambulatory Visit: Payer: Self-pay | Admitting: Internal Medicine

## 2019-07-30 DIAGNOSIS — G4733 Obstructive sleep apnea (adult) (pediatric): Secondary | ICD-10-CM | POA: Diagnosis not present

## 2019-08-04 ENCOUNTER — Other Ambulatory Visit: Payer: Self-pay | Admitting: Cardiovascular Disease

## 2019-08-04 NOTE — Telephone Encounter (Signed)
Eliquis 5mg  refill request received, pt is 74 yrs old, weight-67.1kg, Crea-0.85 on 01/20/2019, Diagnosis-Afib, and last seen by Christell Faith on 03/04/2019. Dose is appropriate based on dosing criteria. Will send in refill to requested pharmacy.

## 2019-08-04 NOTE — Telephone Encounter (Signed)
Refill request

## 2019-08-27 DIAGNOSIS — G4733 Obstructive sleep apnea (adult) (pediatric): Secondary | ICD-10-CM | POA: Diagnosis not present

## 2019-09-02 NOTE — Progress Notes (Signed)
Patient ID: Melissa Bonilla, female    DOB: [birth year], 74 y.o.   MRN: [id number]  HPI  Melissa Bonilla is a 74 y/o female with a history of atrial fibrillation, hyperlipidemia, obstructive sleep apnea, depression, anxiety and chronic heart failure.   Echo report from 01/29/2019 reviewed and showed an EF of 50-55% with mild to moderate mitral valve regurgitation. Echo report from 11/28/2018 reviewed and showed an EF of 40-45%.  Has not been admitted or been in the ED in the last 6 months.   Patient presents for her follow-up visit with a chief complaint of minimal shortness of breath upon moderate exertion. She describes this as chronic in nature having been present for several years. She has associated palpitations and gradual weight gain along with this. She denies any difficulty sleeping, dizziness, abdominal distention, pedal edema, chest pain, cough or fatigue.   Amiodarone was stopped September 2020 as patient was doing well and was on the bradycardic side. She says that she noticed that around Christmas 2020, her fitbit was showing her HR ranging from 90-130's when even at rest. She has been unable to tolerate beta-blockers due to fatigue.    Past Medical History:  Diagnosis Date  . Arrhythmia    atrial fibrillation  . CHF (congestive heart failure) ([place])   . Depression   . Hyperlipidemia   . Menopausal syndrome (hot flashes) 1992   used HRT fo 2 yrs   . Obstructive sleep apnea   . Persistent atrial fibrillation [facility])    Past Surgical History:  Procedure Laterality Date  . APPENDECTOMY  1972  . CARDIOVERSION N/A 11/28/2018   Procedure: CARDIOVERSION;  Surgeon: [name], MD;  Location: [facility] ORS;  Service: Cardiovascular;  Laterality: N/A;  . CARDIOVERSION N/A 12/23/2018   Procedure: CARDIOVERSION;  Surgeon: [name], MD;  Location: [facility] ORS;  Service: Cardiovascular;  Laterality: N/A;  . [place]  . TEE WITHOUT CARDIOVERSION N/A 11/28/2018    Procedure: TRANSESOPHAGEAL ECHOCARDIOGRAM (TEE);  Surgeon: [name], MD;  Location: [facility] ORS;  Service: Cardiovascular;  Laterality: N/A;   Family History  Problem Relation Age of Onset  . Arthritis Mother        Rheumatoid  . Heart disease Mother        Valvular  . Hypertension Mother   . Heart disease Father 89       CABG  . Arthritis Maternal Grandmother        Rheumatoid  . Hypertension Maternal Grandmother   . Stroke Maternal Grandmother   . Breast cancer Paternal Aunt 87  . Breast cancer Paternal Aunt 9   Social History   Tobacco Use  . Smoking status: Former Smoker    Packs/day: 0.25    Years: 15.00    Pack years: 3.75    Types: Cigarettes    Quit date: 1992    Years since quitting: 29.2  . Smokeless tobacco: Never Used  Substance Use Topics  . Alcohol use: Yes   Allergies  Allergen Reactions  . Codeine Nausea Only and Rash  . Morphine Nausea And Vomiting    Pt allergy may be codeine as codeine or morphine were combined on previous health history. Pt reported upset stomach on questionnaire.  . Sulfa Antibiotics Nausea And Vomiting   Prior to Admission medications   Medication Sig Start Date End Date Taking? Authorizing Provider  Cholecalciferol (VITAMIN D3) 1000 units CAPS Take 1,000 Units by mouth daily.  Yes [provider]  ELIQUIS 5 MG TABS tablet TAKE 1 TABLET TWICE DAILY 08/04/19  Yes [name], MD  escitalopram (LEXAPRO) 10 MG tablet TAKE 1 TABLET (10 MG TOTAL) BY MOUTH DAILY. 07/01/19  Yes Crecencio Mc, MD  losartan (COZAAR) 25 MG tablet Take 0.5 tablets (12.5 mg total) by mouth daily. 01/01/19 09/03/19 Yes Dunn, Areta Haber, PA-C  Multiple Vitamin (MULTIVITAMIN WITH MINERALS) TABS tablet Take 1 tablet by mouth daily.   Yes [provider]  vitamin B-12 (CYANOCOBALAMIN) 1000 MCG tablet Take 1,000 mcg by mouth daily.   Yes [provider]  vitamin C (ASCORBIC ACID) 250 MG tablet Take 250 mg by mouth daily.   Yes  [provider]     Review of Systems  Constitutional: Negative for appetite change and fatigue.  HENT: Negative for congestion, postnasal drip and sore throat.   Eyes: Negative.   Respiratory: Positive for shortness of breath (with heavy exertion). Negative for cough.   Cardiovascular: Positive for palpitations (often). Negative for chest pain and leg swelling.  Gastrointestinal: Negative for abdominal distention and abdominal pain.  Endocrine: Negative.   Genitourinary: Negative.   Musculoskeletal: Negative for back pain and neck pain.  Skin: Negative.   Allergic/Immunologic: Negative.   Neurological: Negative for dizziness and light-headedness.  Hematological: Negative for adenopathy. Does not bruise/bleed easily.  Psychiatric/Behavioral: Negative for dysphoric mood and sleep disturbance (started CPAP last week). The patient is not nervous/anxious.    Vitals:   09/03/19 1307  BP: (!) 131/97  Pulse: (!) 132  Resp: 18  SpO2: 96%  Weight: 160 lb 8 oz (72.8 kg)  Height: 5' (1.524 m)   Wt Readings from Last 3 Encounters:  09/03/19 160 lb 8 oz (72.8 kg)  04/09/19 148 lb (67.1 kg)  03/04/19 150 lb 8 oz (68.3 kg)   Lab Results  Component Value Date   CREATININE 0.85 01/20/2019   CREATININE 0.91 01/01/2019   CREATININE 0.91 12/18/2018     Physical Exam Vitals and nursing note reviewed.  Constitutional:      Appearance: Normal appearance.  HENT:     Head: Normocephalic and atraumatic.  Cardiovascular:     Rate and Rhythm: Regular rhythm. Tachycardia present.  Pulmonary:     Effort: Pulmonary effort is normal. No respiratory distress.     Breath sounds: No wheezing or rales.  Abdominal:     Palpations: Abdomen is soft.     Tenderness: There is no abdominal tenderness.  Musculoskeletal:        General: No tenderness.     Cervical back: Normal range of motion and neck supple.     Right lower leg: No edema.     Left lower leg: No edema.  Skin:    General:  Skin is warm and dry.  Neurological:     General: No focal deficit present.     Mental Status: She is alert and oriented to person, place, and time.  Psychiatric:        Mood and Affect: Mood normal.        Behavior: Behavior normal.     Assessment & Plan:  1: Chronic heart failure with mildly reduced ejection fraction- - NYHA class II - euvolemic today - weighing daily and says that her weight has gradually risen; reminded to call for an overnight weight gain of >2 pounds or a weekly weight gain of >5 pounds - weight up 12 pounds from last visit here 6 months ago; patient  admits that she's eating more and moving less due to COVID restrictions and the weather - not adding salt to her food and tries to closely follow a low sodium diet - with new entresto indication, could consider changing her losartan to entresto  2: Atrial fibrillation- - saw cardiology (Dunn) 03/04/2019 & amiodarone was stopped - tachycardic today even after sitting for 15 minutes - EKG done in the office and shows atrial flutter - after consulting with Dr. Saunders Revel, will start diltiazem ER 120mg  daily; advised patient that should her palpitations/ shortness of breath worsen, to let us or Dr. Darnelle Bos office know - she sees him next week  3: HTN- - BP mildly elevated today; adding diltiazem per above - saw PCP Derrel Nip) 01/20/2019 via telemedicine - BMP 01/20/2019 reviewed and showed sodium 139, potassium 4.1, creatinine 0.85 and GFR >60  4: Obstructive sleep apnea- - wearing her CPAP and feels like she's sleeping well - Saw pulmonology (Kasa) 04/09/19   Medication list reviewed.  Return here in 6 months or sooner for any questions/problems before then.

## 2019-09-03 ENCOUNTER — Ambulatory Visit: Payer: Medicare HMO | Attending: Family | Admitting: Family

## 2019-09-03 ENCOUNTER — Other Ambulatory Visit: Payer: Self-pay

## 2019-09-03 ENCOUNTER — Encounter: Payer: Self-pay | Admitting: Family

## 2019-09-03 VITALS — BP 131/97 | HR 132 | Resp 18 | Ht 60.0 in | Wt 160.5 lb

## 2019-09-03 DIAGNOSIS — I4819 Other persistent atrial fibrillation: Secondary | ICD-10-CM

## 2019-09-03 DIAGNOSIS — Z79899 Other long term (current) drug therapy: Secondary | ICD-10-CM | POA: Diagnosis not present

## 2019-09-03 DIAGNOSIS — E785 Hyperlipidemia, unspecified: Secondary | ICD-10-CM | POA: Diagnosis not present

## 2019-09-03 DIAGNOSIS — F329 Major depressive disorder, single episode, unspecified: Secondary | ICD-10-CM | POA: Insufficient documentation

## 2019-09-03 DIAGNOSIS — I4892 Unspecified atrial flutter: Secondary | ICD-10-CM | POA: Diagnosis not present

## 2019-09-03 DIAGNOSIS — Z87891 Personal history of nicotine dependence: Secondary | ICD-10-CM | POA: Insufficient documentation

## 2019-09-03 DIAGNOSIS — I5022 Chronic systolic (congestive) heart failure: Secondary | ICD-10-CM | POA: Insufficient documentation

## 2019-09-03 DIAGNOSIS — R002 Palpitations: Secondary | ICD-10-CM | POA: Diagnosis not present

## 2019-09-03 DIAGNOSIS — I340 Nonrheumatic mitral (valve) insufficiency: Secondary | ICD-10-CM | POA: Diagnosis not present

## 2019-09-03 DIAGNOSIS — Z7901 Long term (current) use of anticoagulants: Secondary | ICD-10-CM | POA: Diagnosis not present

## 2019-09-03 DIAGNOSIS — F419 Anxiety disorder, unspecified: Secondary | ICD-10-CM | POA: Diagnosis not present

## 2019-09-03 DIAGNOSIS — R0602 Shortness of breath: Secondary | ICD-10-CM | POA: Insufficient documentation

## 2019-09-03 DIAGNOSIS — I110 Hypertensive heart disease with heart failure: Secondary | ICD-10-CM | POA: Diagnosis not present

## 2019-09-03 DIAGNOSIS — G4733 Obstructive sleep apnea (adult) (pediatric): Secondary | ICD-10-CM | POA: Insufficient documentation

## 2019-09-03 DIAGNOSIS — G4730 Sleep apnea, unspecified: Secondary | ICD-10-CM

## 2019-09-03 DIAGNOSIS — Z8249 Family history of ischemic heart disease and other diseases of the circulatory system: Secondary | ICD-10-CM | POA: Diagnosis not present

## 2019-09-03 DIAGNOSIS — I10 Essential (primary) hypertension: Secondary | ICD-10-CM

## 2019-09-03 MED ORDER — DILTIAZEM HCL ER COATED BEADS 120 MG PO TB24: 120.0000 mg | ORAL_TABLET | Freq: Every day | ORAL | 5 refills | Status: DC

## 2019-09-03 NOTE — Patient Instructions (Addendum)
Continue weighing daily and call for an overnight weight gain of > 2 pounds or a weekly weight gain of >5 pounds.   Take cardizem once daily in the morning

## 2019-09-04 ENCOUNTER — Encounter: Payer: Self-pay | Admitting: Family

## 2019-09-10 ENCOUNTER — Other Ambulatory Visit: Payer: Self-pay

## 2019-09-10 ENCOUNTER — Ambulatory Visit (INDEPENDENT_AMBULATORY_CARE_PROVIDER_SITE_OTHER): Payer: Medicare HMO | Admitting: Internal Medicine

## 2019-09-10 ENCOUNTER — Encounter: Payer: Self-pay | Admitting: Internal Medicine

## 2019-09-10 VITALS — BP 122/78 | HR 124 | Ht 60.0 in | Wt 157.5 lb

## 2019-09-10 DIAGNOSIS — I428 Other cardiomyopathies: Secondary | ICD-10-CM | POA: Diagnosis not present

## 2019-09-10 DIAGNOSIS — I4892 Unspecified atrial flutter: Secondary | ICD-10-CM | POA: Diagnosis not present

## 2019-09-10 DIAGNOSIS — I10 Essential (primary) hypertension: Secondary | ICD-10-CM | POA: Diagnosis not present

## 2019-09-10 MED ORDER — DILTIAZEM HCL ER COATED BEADS 120 MG PO TB24: 120.0000 mg | ORAL_TABLET | Freq: Two times a day (BID) | ORAL | 4 refills | Status: DC

## 2019-09-10 MED ORDER — AMIODARONE HCL 200 MG PO TABS: 200.0000 mg | ORAL_TABLET | Freq: Three times a day (TID) | ORAL | 3 refills | Status: DC

## 2019-09-10 NOTE — Progress Notes (Signed)
Follow-up Outpatient Visit Date: 09/10/2019  Primary Care Provider: Crecencio Mc, MD [address] [place] [address]  Chief Complaint: Follow-up she will fibrillation/flutter  HPI:  Melissa Bonilla is a 74 y.o. female with history of persistent atrial fibrillation, chronic HFrEF, hypertension, hyperlipidemia, depression, and remote tobacco use, who presents for follow-up of atrial fibrillation and heart failure.  She was last seen in our office in 02/2019.  She initially was hospitalized in 11/2018 with atrial fibrillation with rapid ventricular response and moderately reduced LVEF.  She underwent TEE guided cardioversion, though TEE was complicated by respiratory distress thought to be due to laryngospasm.  Following the procedure, she went into atrial flutter and was given IV amiodarone with resultant hypotension.  The next day, she was found to be back in atrial fibrillation.  After completing amiodarone load, she spontaneously converted back to sinus rhythm.  At her last visit in September, she was doing well without chest pain, shortness of breath, or palpitations.  Follow-up echo in 01/2019 demonstrated improved systolic function with an LVEF of 50-55%.  Melissa Bonilla was seen by Darylene Price, NP, last week and was noted to be in atrial flutter with ventricular rates around 130 bpm.  She was placed on diltiazem 120 mg daily.  Today, Melissa Bonilla reports that she feels relatively well.  She has been having palpitations leading up to her visit with Melissa Bonilla last week but notes that those have ceased with addition of diltiazem.  She denies chest pain, shortness of breath, lightheadedness, and edema.  She remains compliant with her medications, including apixaban, which she has not missed since temporarily running out of the medication in 05/2019.  She has not had any significant  bleeding.  --------------------------------------------------------------------------------------------------  Past Medical History:  Diagnosis Date  . Arrhythmia    atrial fibrillation  . CHF (congestive heart failure) ([place])   . Depression   . Hyperlipidemia   . Menopausal syndrome (hot flashes) 1992   used HRT fo 2 yrs   . Obstructive sleep apnea   . Persistent atrial fibrillation [facility])    Past Surgical History:  Procedure Laterality Date  . APPENDECTOMY  1972  . CARDIOVERSION N/A 11/28/2018   Procedure: CARDIOVERSION;  Surgeon: [name], MD;  Location: [facility] ORS;  Service: Cardiovascular;  Laterality: N/A;  . CARDIOVERSION N/A 12/23/2018   Procedure: CARDIOVERSION;  Surgeon: [name], MD;  Location: [facility] ORS;  Service: Cardiovascular;  Laterality: N/A;  . [place]  . TEE WITHOUT CARDIOVERSION N/A 11/28/2018   Procedure: TRANSESOPHAGEAL ECHOCARDIOGRAM (TEE);  Surgeon: [name], MD;  Location: [facility] ORS;  Service: Cardiovascular;  Laterality: N/A;    Current Meds  Medication Sig  . Cholecalciferol (VITAMIN D3) 1000 units CAPS Take 1,000 Units by mouth daily.  Marland Kitchen diltiazem (CARDIZEM LA) 120 MG 24 hr tablet Take 1 tablet (120 mg total) by mouth in the morning and at bedtime.  Marland Kitchen ELIQUIS 5 MG TABS tablet TAKE 1 TABLET TWICE DAILY  . escitalopram (LEXAPRO) 10 MG tablet TAKE 1 TABLET (10 MG TOTAL) BY MOUTH DAILY.  . Multiple Vitamin (MULTIVITAMIN WITH MINERALS) TABS tablet Take 1 tablet by mouth daily.  Deborah Chalk ER 120 MG 24 hr capsule Take 120 mg by mouth daily.   . vitamin B-12 (CYANOCOBALAMIN) 1000 MCG tablet Take 1,000 mcg by mouth daily.  . vitamin C (ASCORBIC ACID) 250 MG tablet Take 250 mg by mouth daily.  . [DISCONTINUED]  diltiazem (CARDIZEM LA) 120 MG 24 hr tablet Take 1 tablet (120 mg total) by mouth daily.  . [DISCONTINUED] losartan (COZAAR) 25 MG tablet Take 0.5 tablets (12.5 mg total) by mouth daily.    Allergies: Codeine,  Morphine, and Sulfa antibiotics  Social History   Tobacco Use  . Smoking status: Former Smoker    Packs/day: 0.25    Years: 15.00    Pack years: 3.75    Types: Cigarettes    Quit date: 1992    Years since quitting: 29.2  . Smokeless tobacco: Never Used  Substance Use Topics  . Alcohol use: Yes  . Drug use: No    Family History  Problem Relation Age of Onset  . Arthritis Mother        Rheumatoid  . Heart disease Mother        Valvular  . Hypertension Mother   . Heart disease Father 65       CABG  . Arthritis Maternal Grandmother        Rheumatoid  . Hypertension Maternal Grandmother   . Stroke Maternal Grandmother   . Breast cancer Paternal Aunt 67  . Breast cancer Paternal Aunt 71    Review of Systems: A 12-system review of systems was performed and was negative except as noted in the HPI.  --------------------------------------------------------------------------------------------------  Physical Exam: BP 122/78 (BP Location: Left Arm, Patient Position: Sitting, Cuff Size: Normal)   Pulse (!) 124   Ht 5' (1.524 m)   Wt 157 lb 8 oz (71.4 kg)   SpO2 98%   BMI 30.76 kg/m   General: NAD. HEENT: No conjunctival pallor or scleral icterus. Facemask in place. Neck: Supple without lymphadenopathy, thyromegaly, JVD, or HJR. Lungs: Normal work of breathing. Clear to auscultation bilaterally without wheezes or crackles. Heart: Tachycardic but regular without murmurs. Abd: Bowel sounds present. Soft, NT/ND without hepatosplenomegaly Ext: No lower extremity edema. Radial, PT, and DP pulses are 2+ bilaterally. Skin: Warm and dry without rash.  EKG: Atrial flutter with variable block.  Ventricular rate 124 bpm.  Nonspecific ST/T changes noted.  Lab Results  Component Value Date   WBC 4.8 01/01/2019   HGB 14.9 01/01/2019   HCT 42.9 01/01/2019   MCV 89 01/01/2019   PLT 199 01/01/2019    Lab Results  Component Value Date   NA 139 01/20/2019   K 4.1 01/20/2019    CL 104 01/20/2019   CO2 26 01/20/2019   BUN 21 01/20/2019   CREATININE 0.85 01/20/2019   GLUCOSE 94 01/20/2019   ALT 25 12/18/2018    Lab Results  Component Value Date   CHOL 145 11/26/2018   HDL 39 (L) 11/26/2018   LDLCALC 95 11/26/2018   LDLDIRECT 175.5 03/19/2013   TRIG 54 11/26/2018   CHOLHDL 3.7 11/26/2018    --------------------------------------------------------------------------------------------------  ASSESSMENT AND PLAN: Atrial flutter: Patient still in atrial flutter, which was first noted again yesterday after having maintained sinus rhythm since last summer.  As she is minimally symptomatic, I do not believe that Melissa Bonilla needs admission at this time.  We have agreed to increase diltiazem to 120 mg twice daily and restart amiodarone 200 mg 3 times daily.  I will have her return in 1 week to reassess her rhythm and to arrange for cardioversion if she remains in atrial flutter.  I will also refer her to electrophysiology to discuss long-term antiarrhythmic therapy versus flutter ablation.  We will continue with apixaban 5 mg twice daily.  I will check  a CBC, CMP, and TSH today.  Nonischemic cardiomyopathy: Melissa Bonilla appears euvolemic and is noted to have had normalization of her LVEF on most recent echo.  Thought is that her cardiomyopathy was tachycardia mediated.  I will discontinue losartan in the setting of increasing diltiazem.  Long-term, we will try to get her back on evidence-based heart failure therapy.  She is not on a beta-blocker, as she reported fatigue with this in the past.  Hypertension: Blood pressure normal today.  In the setting of increasing diltiazem, I will hold losartan.  Follow-up: Return to clinic in 1 week.  Nelva Bush, MD 09/10/2019 3:18 PM

## 2019-09-10 NOTE — Patient Instructions (Signed)
Medication Instructions:  Your physician has recommended you make the following change in your medication:  1- STOP Losartan. 2- START Amiodarone 200 mg (1 tablet) by mouth three times a day. 3- INCREASE Diltiazem to 120 mg (1 tablet) by mouth two times a day.  *If you need a refill on your cardiac medications before your next appointment, please call your pharmacy*   Lab Work: Your physician recommends that you return for lab work in: today - CBC, CMP, TSH.  If you have labs (blood work) drawn today and your tests are completely normal, you will receive your results only by: Marland Kitchen MyChart Message (if you have MyChart) OR . A paper copy in the mail If you have any lab test that is abnormal or we need to change your treatment, we will call you to review the results.   Testing/Procedures: none  Follow-Up: You have been referred to Electrophysiology - Dr Caryl Comes for atrial flutter.   At [facility], you and your health needs are our priority.  As part of our continuing mission to provide you with exceptional heart care, we have created designated Provider Care Teams.  These Care Teams include your primary Cardiologist (physician) and Advanced Practice Providers (APPs -  Physician Assistants and Nurse Practitioners) who all work together to provide you with the care you need, when you need it.  We recommend signing up for the patient portal called "MyChart".  Sign up information is provided on this After Visit Summary.  MyChart is used to connect with patients for Virtual Visits (Telemedicine).  Patients are able to view lab/test results, encounter notes, upcoming appointments, etc.  Non-urgent messages can be sent to your provider as well.   To learn more about what you can do with MyChart, go to [email or web address].    Your next appointment:   1 week(s) with APP  The format for your next appointment:   In Person  Provider:    You may see one of the following Advanced Practice  Providers on your designated Care Team:    Murray Hodgkins, NP  Christell Faith, PA-C  Marrianne Mood, PA-C

## 2019-09-11 LAB — CBC
Hematocrit: 43.1 % (ref 34.0–46.6)
Hemoglobin: 14.5 g/dL (ref 11.1–15.9)
MCH: 31 pg (ref 26.6–33.0)
MCHC: 33.6 g/dL (ref 31.5–35.7)
MCV: 92 fL (ref 79–97)
Platelets: 212 10*3/uL (ref 150–450)
RBC: 4.67 x10E6/uL (ref 3.77–5.28)
RDW: 12.1 % (ref 11.7–15.4)
WBC: 5.8 10*3/uL (ref 3.4–10.8)

## 2019-09-11 LAB — COMPREHENSIVE METABOLIC PANEL
ALT: 15 IU/L (ref 0–32)
AST: 21 IU/L (ref 0–40)
Albumin/Globulin Ratio: 1.9 (ref 1.2–2.2)
Albumin: 4.3 g/dL (ref 3.7–4.7)
Alkaline Phosphatase: 113 IU/L (ref 39–117)
BUN/Creatinine Ratio: 18 (ref 12–28)
BUN: 16 mg/dL (ref 8–27)
Bilirubin Total: 0.4 mg/dL (ref 0.0–1.2)
CO2: 24 mmol/L (ref 20–29)
Calcium: 9.2 mg/dL (ref 8.7–10.3)
Chloride: 100 mmol/L (ref 96–106)
Creatinine, Ser: 0.88 mg/dL (ref 0.57–1.00)
GFR calc Af Amer: 75 mL/min/{1.73_m2} (ref >59)
GFR calc non Af Amer: 65 mL/min/{1.73_m2} (ref >59)
Globulin, Total: 2.3 g/dL (ref 1.5–4.5)
Glucose: 81 mg/dL (ref 65–99)
Potassium: 4.9 mmol/L (ref 3.5–5.2)
Sodium: 138 mmol/L (ref 134–144)
Total Protein: 6.6 g/dL (ref 6.0–8.5)

## 2019-09-11 LAB — TSH: TSH: 2.03 u[IU]/mL (ref 0.450–4.500)

## 2019-09-17 ENCOUNTER — Encounter: Payer: Self-pay | Admitting: Internal Medicine

## 2019-09-17 ENCOUNTER — Ambulatory Visit (INDEPENDENT_AMBULATORY_CARE_PROVIDER_SITE_OTHER): Payer: Medicare HMO | Admitting: Internal Medicine

## 2019-09-17 ENCOUNTER — Other Ambulatory Visit: Payer: Self-pay

## 2019-09-17 VITALS — BP 130/80 | HR 111 | Ht 60.0 in | Wt 160.2 lb

## 2019-09-17 DIAGNOSIS — I428 Other cardiomyopathies: Secondary | ICD-10-CM | POA: Diagnosis not present

## 2019-09-17 DIAGNOSIS — I4892 Unspecified atrial flutter: Secondary | ICD-10-CM | POA: Diagnosis not present

## 2019-09-17 MED ORDER — AMIODARONE HCL 200 MG PO TABS: ORAL_TABLET | ORAL | 3 refills | Status: DC

## 2019-09-17 NOTE — H&P (View-Only) (Signed)
Follow-up Outpatient Visit Date: 09/17/2019  Primary Care Provider: Crecencio Mc, MD [address] [place] [address]  Chief Complaint: Follow-up atrial flutter and heart failure  HPI:  Melissa. Melissa Bonilla is a 74 y.o. female with history of persistent atrial fibrillation and atrial flutter, chronic HFrEF, hypertension, hyperlipidemia, depression, and remote tobacco use, who presents for follow-up of atrial flutter.  I saw her a week ago, which time Melissa Bonilla was noted to be back in atrial flutter.  She had previously been placed on a low-dose diltiazem by Darylene Price during her heart failure visit, which we increased due to suboptimal ventricular rate control.  As she previously required amiodarone to maintain sinus rhythm following cardioversion, we agreed to initiate amiodarone 200 mg 3 times daily and also increase diltiazem to 120 mg twice daily.  Referral to electrophysiology was made to discuss long-term management of atrial flutter.  Today, Melissa. Bonilla reports that she has been feeling well.  She denies chest pain, shortness of breath, lightheadedness, and edema, though she has put on ~3 pounds over the last week.  She endorses mild palpitations when she first wakes up in the morning but otherwise has been asymptomatic.  She notes that her heart rate has been a little slower per or Fitbit.  She is tolerating diltiazem and amiodarone well.  She has not missed any doses of rivaroxaban.  --------------------------------------------------------------------------------------------------  Past Medical History:  Diagnosis Date  . Arrhythmia    atrial fibrillation  . CHF (congestive heart failure) ([place])   . Depression   . Hyperlipidemia   . Menopausal syndrome (hot flashes) 1992   used HRT fo 2 yrs   . Obstructive sleep apnea   . Persistent atrial fibrillation [facility])    Past Surgical History:  Procedure Laterality Date  . APPENDECTOMY  1972  . CARDIOVERSION N/A 11/28/2018    Procedure: CARDIOVERSION;  Surgeon: [name], MD;  Location: [facility] ORS;  Service: Cardiovascular;  Laterality: N/A;  . CARDIOVERSION N/A 12/23/2018   Procedure: CARDIOVERSION;  Surgeon: [name], MD;  Location: [facility] ORS;  Service: Cardiovascular;  Laterality: N/A;  . [place]  . TEE WITHOUT CARDIOVERSION N/A 11/28/2018   Procedure: TRANSESOPHAGEAL ECHOCARDIOGRAM (TEE);  Surgeon: [name], MD;  Location: [facility] ORS;  Service: Cardiovascular;  Laterality: N/A;    Current Meds  Medication Sig  . amiodarone (PACERONE) 200 MG tablet Take 200 mg (1 tablet) by mouth 3 times a day for 1 week, then change to 200 mg (1 tablet) two times a day starting in 1 week on 09/24/19.  Marland Kitchen Cholecalciferol (VITAMIN D3) 1000 units CAPS Take 1,000 Units by mouth daily.  Marland Kitchen diltiazem (CARDIZEM LA) 120 MG 24 hr tablet Take 1 tablet (120 mg total) by mouth in the morning and at bedtime.  Marland Kitchen ELIQUIS 5 MG TABS tablet TAKE 1 TABLET TWICE DAILY  . escitalopram (LEXAPRO) 10 MG tablet TAKE 1 TABLET (10 MG TOTAL) BY MOUTH DAILY.  . Multiple Vitamin (MULTIVITAMIN WITH MINERALS) TABS tablet Take 1 tablet by mouth daily.  . vitamin B-12 (CYANOCOBALAMIN) 1000 MCG tablet Take 1,000 mcg by mouth daily.  . vitamin C (ASCORBIC ACID) 250 MG tablet Take 250 mg by mouth daily.  . [DISCONTINUED] amiodarone (PACERONE) 200 MG tablet Take 1 tablet (200 mg total) by mouth in the morning, at noon, and at bedtime.    Allergies: Codeine, Morphine, and Sulfa antibiotics  Social History   Tobacco Use  .  Smoking status: Former Smoker    Packs/day: 0.25    Years: 15.00    Pack years: 3.75    Types: Cigarettes    Quit date: 1992    Years since quitting: 29.2  . Smokeless tobacco: Never Used  Substance Use Topics  . Alcohol use: Yes  . Drug use: No    Family History  Problem Relation Age of Onset  . Arthritis Mother        Rheumatoid  . Heart disease Mother        Valvular  . Hypertension  Mother   . Heart disease Father 56       CABG  . Arthritis Maternal Grandmother        Rheumatoid  . Hypertension Maternal Grandmother   . Stroke Maternal Grandmother   . Breast cancer Paternal Aunt 46  . Breast cancer Paternal Aunt 64    Review of Systems: A 12-system review of systems was performed and was negative except as noted in the HPI.  --------------------------------------------------------------------------------------------------  Physical Exam: BP 130/80 (BP Location: Left Arm, Patient Position: Sitting, Cuff Size: Normal)   Pulse (!) 111   Ht 5' (1.524 m)   Wt 160 lb 3.2 oz (72.7 kg)   SpO2 96%   BMI 31.29 kg/m   General:  NAD. Neck: No JVD or HJR. Lungs: CTA bilaterally. Heart: Tachycardic and intermittently irregular.  No murmurs. Abd: Soft, NT/ND. Ext: No LE edema.  EKG:  Atrial flutter with 2:1 AV block (ventricular rate 111 bpm).  Nonspecific ST/T changes.  Lab Results  Component Value Date   WBC 5.8 09/10/2019   HGB 14.5 09/10/2019   HCT 43.1 09/10/2019   MCV 92 09/10/2019   PLT 212 09/10/2019    Lab Results  Component Value Date   NA 138 09/10/2019   K 4.9 09/10/2019   CL 100 09/10/2019   CO2 24 09/10/2019   BUN 16 09/10/2019   CREATININE 0.88 09/10/2019   GLUCOSE 81 09/10/2019   ALT 15 09/10/2019    Lab Results  Component Value Date   CHOL 145 11/26/2018   HDL 39 (L) 11/26/2018   LDLCALC 95 11/26/2018   LDLDIRECT 175.5 03/19/2013   TRIG 54 11/26/2018   CHOLHDL 3.7 11/26/2018    --------------------------------------------------------------------------------------------------  ASSESSMENT AND PLAN: Atrial flutter: Ventricular rate improved with escalation of diltiazem and addition of amiodarone.  We discussed proceeding with DCCV next week, though Melissa. Bonilla would like to see if she converts with amiodarone a little longer.  We will therefore continue amiodarone 200 mg TID x 1 more week, then transition to 200 mg BID.  We will  tentatively plan for DCCV in ~2 weeks, though she should contact us if she notices that her heart rate has decreased significantly so that we can perform an EKG in the office to determine if she has converted to sinus rhythm.  I will continue diltiazem 120 mg BID for now.  Importance of continued anticoagulation with rivaroxaban was stressed.  NICM: Melissa. Gann appears euvolemic on exam with class I symptoms.  No medication changes will be made at this time.  Follow-up: Return to clinic 2 weeks after cardioversion.  Nelva Bush, MD 09/18/2019 9:40 AM

## 2019-09-17 NOTE — Patient Instructions (Addendum)
If you feel lightheaded or see that your heart rate has slowed down (such as into the 60's), please call us to let us know as you may need to come in for EKG check.   Medication Instructions:  Your physician has recommended you make the following change in your medication:  1- Take Amiodarone 200 mg (1 tablet) by mouth 3 times a day for 1 week, then change to 200 mg (1 tablet) by mouth 2 times a day starting on 09/24/19.  *If you need a refill on your cardiac medications before your next appointment, please call your pharmacy*   Lab Work: 1- COVID PRE- TEST: You will need a COVID TEST prior to the procedure:  LOCATION: [place] [facility] site.  DATE/TIME: Friday, September 26, 2019 sometime between 8 am and 1 pm.  If you have labs (blood work) drawn today and your tests are completely normal, you will receive your results only by: Marland Kitchen MyChart Message (if you have MyChart) OR . A paper copy in the mail If you have any lab test that is abnormal or we need to change your treatment, we will call you to review the results.   Testing/Procedures: You are scheduled for a Cardioversion on __04/13/21____ with Dr.____END__ Please arrive at the [place] [facility] at __06:30__ a.m. on the day of your procedure.  DIET INSTRUCTIONS:  Nothing to eat or drink after midnight except your medications with a small sip of water.         1) Labs: _____n/a______  2) Medications:  YOU MAY TAKE ALL of your remaining medications with a small amount of water.  3) Must have a responsible person to drive you home.  4) Bring a current list of your medications and current insurance cards.    If you have any questions after you get home, please call the office at [phone]   Follow-Up: At [facility], you and your health needs are our priority.  As part of our continuing mission to provide you with exceptional heart care, we have created designated Provider Care Teams.  These Care Teams  include your primary Cardiologist (physician) and Advanced Practice Providers (APPs -  Physician Assistants and Nurse Practitioners) who all work together to provide you with the care you need, when you need it.  We recommend signing up for the patient portal called "MyChart".  Sign up information is provided on this After Visit Summary.  MyChart is used to connect with patients for Virtual Visits (Telemedicine).  Patients are able to view lab/test results, encounter notes, upcoming appointments, etc.  Non-urgent messages can be sent to your provider as well.   To learn more about what you can do with MyChart, go to [email or web address].    Your next appointment:   2 week(s) after cardioversion which will be on 09/30/19.  The format for your next appointment:   In Person  Provider:    You may see Nelva Bush, MD or one of the following Advanced Practice Providers on your designated Care Team:    Murray Hodgkins, NP  Christell Faith, PA-C  Marrianne Mood, PA-C    Electrical Cardioversion Electrical cardioversion is the delivery of a jolt of electricity to restore a normal rhythm to the heart. A rhythm that is too fast or is not regular keeps the heart from pumping well. In this procedure, sticky patches or metal paddles are placed on the chest to deliver electricity to the heart from a device. This  procedure may be done in an emergency if:  There is low or no blood pressure as a result of the heart rhythm.  Normal rhythm must be restored as fast as possible to protect the brain and heart from further damage.  It may save a life. This may also be a scheduled procedure for irregular or fast heart rhythms that are not immediately life-threatening. Tell a health care provider about:  Any allergies you have.  All medicines you are taking, including vitamins, herbs, eye drops, creams, and over-the-counter medicines.  Any problems you or family members have had with anesthetic  medicines.  Any blood disorders you have.  Any surgeries you have had.  Any medical conditions you have.  Whether you are pregnant or may be pregnant. What are the risks? Generally, this is a safe procedure. However, problems may occur, including:  Allergic reactions to medicines.  A blood clot that breaks free and travels to other parts of your body.  The possible return of an abnormal heart rhythm within hours or days after the procedure.  Your heart stopping (cardiac arrest). This is rare. What happens before the procedure? Medicines  Your health care provider may have you start taking: ? Blood-thinning medicines (anticoagulants) so your blood does not clot as easily. ? Medicines to help stabilize your heart rate and rhythm.  Ask your health care provider about: ? Changing or stopping your regular medicines. This is especially important if you are taking diabetes medicines or blood thinners. ? Taking medicines such as aspirin and ibuprofen. These medicines can thin your blood. Do not take these medicines unless your health care provider tells you to take them. ? Taking over-the-counter medicines, vitamins, herbs, and supplements. General instructions  Follow instructions from your health care provider about eating or drinking restrictions.  Plan to have someone take you home from the hospital or clinic.  If you will be going home right after the procedure, plan to have someone with you for 24 hours.  Ask your health care provider what steps will be taken to help prevent infection. These may include washing your skin with a germ-killing soap. What happens during the procedure?   An IV will be inserted into one of your veins.  Sticky patches (electrodes) or metal paddles may be placed on your chest.  You will be given a medicine to help you relax (sedative).  An electrical shock will be delivered. The procedure may vary among health care providers and  hospitals. What can I expect after the procedure?  Your blood pressure, heart rate, breathing rate, and blood oxygen level will be monitored until you leave the hospital or clinic.  Your heart rhythm will be watched to make sure it does not change.  You may have some redness on the skin where the shocks were given. Follow these instructions at home:  Do not drive for 24 hours if you were given a sedative during your procedure.  Take over-the-counter and prescription medicines only as told by your health care provider.  Ask your health care provider how to check your pulse. Check it often.  Rest for 48 hours after the procedure or as told by your health care provider.  Avoid or limit your caffeine use as told by your health care provider.  Keep all follow-up visits as told by your health care provider. This is important. Contact a health care provider if:  You feel like your heart is beating too quickly or your pulse is not regular.  You have a serious muscle cramp that does not go away. Get help right away if:  You have discomfort in your chest.  You are dizzy or you feel faint.  You have trouble breathing or you are short of breath.  Your speech is slurred.  You have trouble moving an arm or leg on one side of your body.  Your fingers or toes turn cold or blue. Summary  Electrical cardioversion is the delivery of a jolt of electricity to restore a normal rhythm to the heart.  This procedure may be done right away in an emergency or may be a scheduled procedure if the condition is not an emergency.  Generally, this is a safe procedure.  After the procedure, check your pulse often as told by your health care provider. This information is not intended to replace advice given to you by your health care provider. Make sure you discuss any questions you have with your health care provider. Document Revised: 01/06/2019 Document Reviewed: 01/06/2019 Elsevier Patient  Education  [place].

## 2019-09-17 NOTE — Progress Notes (Signed)
Follow-up Outpatient Visit Date: 09/17/2019  Primary Care Provider: Crecencio Mc, MD [address] [place] [address]  Chief Complaint: Follow-up atrial flutter and heart failure  HPI:  Ms. Norcutt is a 74 y.o. female with history of persistent atrial fibrillation and atrial flutter, chronic HFrEF, hypertension, hyperlipidemia, depression, and remote tobacco use, who presents for follow-up of atrial flutter.  I saw her a week ago, which time Ms Wilbourn was noted to be back in atrial flutter.  She had previously been placed on a low-dose diltiazem by Darylene Price during her heart failure visit, which we increased due to suboptimal ventricular rate control.  As she previously required amiodarone to maintain sinus rhythm following cardioversion, we agreed to initiate amiodarone 200 mg 3 times daily and also increase diltiazem to 120 mg twice daily.  Referral to electrophysiology was made to discuss long-term management of atrial flutter.  Today, Ms. Arista reports that she has been feeling well.  She denies chest pain, shortness of breath, lightheadedness, and edema, though she has put on ~3 pounds over the last week.  She endorses mild palpitations when she first wakes up in the morning but otherwise has been asymptomatic.  She notes that her heart rate has been a little slower per or Fitbit.  She is tolerating diltiazem and amiodarone well.  She has not missed any doses of rivaroxaban.  --------------------------------------------------------------------------------------------------  Past Medical History:  Diagnosis Date  . Arrhythmia    atrial fibrillation  . CHF (congestive heart failure) ([place])   . Depression   . Hyperlipidemia   . Menopausal syndrome (hot flashes) 1992   used HRT fo 2 yrs   . Obstructive sleep apnea   . Persistent atrial fibrillation [facility])    Past Surgical History:  Procedure Laterality Date  . APPENDECTOMY  1972  . CARDIOVERSION N/A 11/28/2018    Procedure: CARDIOVERSION;  Surgeon: [name], MD;  Location: [facility] ORS;  Service: Cardiovascular;  Laterality: N/A;  . CARDIOVERSION N/A 12/23/2018   Procedure: CARDIOVERSION;  Surgeon: [name], MD;  Location: [facility] ORS;  Service: Cardiovascular;  Laterality: N/A;  . [place]  . TEE WITHOUT CARDIOVERSION N/A 11/28/2018   Procedure: TRANSESOPHAGEAL ECHOCARDIOGRAM (TEE);  Surgeon: [name], MD;  Location: [facility] ORS;  Service: Cardiovascular;  Laterality: N/A;    Current Meds  Medication Sig  . amiodarone (PACERONE) 200 MG tablet Take 200 mg (1 tablet) by mouth 3 times a day for 1 week, then change to 200 mg (1 tablet) two times a day starting in 1 week on 09/24/19.  Marland Kitchen Cholecalciferol (VITAMIN D3) 1000 units CAPS Take 1,000 Units by mouth daily.  Marland Kitchen diltiazem (CARDIZEM LA) 120 MG 24 hr tablet Take 1 tablet (120 mg total) by mouth in the morning and at bedtime.  Marland Kitchen ELIQUIS 5 MG TABS tablet TAKE 1 TABLET TWICE DAILY  . escitalopram (LEXAPRO) 10 MG tablet TAKE 1 TABLET (10 MG TOTAL) BY MOUTH DAILY.  . Multiple Vitamin (MULTIVITAMIN WITH MINERALS) TABS tablet Take 1 tablet by mouth daily.  . vitamin B-12 (CYANOCOBALAMIN) 1000 MCG tablet Take 1,000 mcg by mouth daily.  . vitamin C (ASCORBIC ACID) 250 MG tablet Take 250 mg by mouth daily.  . [DISCONTINUED] amiodarone (PACERONE) 200 MG tablet Take 1 tablet (200 mg total) by mouth in the morning, at noon, and at bedtime.    Allergies: Codeine, Morphine, and Sulfa antibiotics  Social History   Tobacco Use  .  Smoking status: Former Smoker    Packs/day: 0.25    Years: 15.00    Pack years: 3.75    Types: Cigarettes    Quit date: 1992    Years since quitting: 29.2  . Smokeless tobacco: Never Used  Substance Use Topics  . Alcohol use: Yes  . Drug use: No    Family History  Problem Relation Age of Onset  . Arthritis Mother        Rheumatoid  . Heart disease Mother        Valvular  . Hypertension  Mother   . Heart disease Father 75       CABG  . Arthritis Maternal Grandmother        Rheumatoid  . Hypertension Maternal Grandmother   . Stroke Maternal Grandmother   . Breast cancer Paternal Aunt 65  . Breast cancer Paternal Aunt 40    Review of Systems: A 12-system review of systems was performed and was negative except as noted in the HPI.  --------------------------------------------------------------------------------------------------  Physical Exam: BP 130/80 (BP Location: Left Arm, Patient Position: Sitting, Cuff Size: Normal)   Pulse (!) 111   Ht 5' (1.524 m)   Wt 160 lb 3.2 oz (72.7 kg)   SpO2 96%   BMI 31.29 kg/m   General:  NAD. Neck: No JVD or HJR. Lungs: CTA bilaterally. Heart: Tachycardic and intermittently irregular.  No murmurs. Abd: Soft, NT/ND. Ext: No LE edema.  EKG:  Atrial flutter with 2:1 AV block (ventricular rate 111 bpm).  Nonspecific ST/T changes.  Lab Results  Component Value Date   WBC 5.8 09/10/2019   HGB 14.5 09/10/2019   HCT 43.1 09/10/2019   MCV 92 09/10/2019   PLT 212 09/10/2019    Lab Results  Component Value Date   NA 138 09/10/2019   K 4.9 09/10/2019   CL 100 09/10/2019   CO2 24 09/10/2019   BUN 16 09/10/2019   CREATININE 0.88 09/10/2019   GLUCOSE 81 09/10/2019   ALT 15 09/10/2019    Lab Results  Component Value Date   CHOL 145 11/26/2018   HDL 39 (L) 11/26/2018   LDLCALC 95 11/26/2018   LDLDIRECT 175.5 03/19/2013   TRIG 54 11/26/2018   CHOLHDL 3.7 11/26/2018    --------------------------------------------------------------------------------------------------  ASSESSMENT AND PLAN: Atrial flutter: Ventricular rate improved with escalation of diltiazem and addition of amiodarone.  We discussed proceeding with DCCV next week, though Ms. Marsh would like to see if she converts with amiodarone a little longer.  We will therefore continue amiodarone 200 mg TID x 1 more week, then transition to 200 mg BID.  We will  tentatively plan for DCCV in ~2 weeks, though she should contact us if she notices that her heart rate has decreased significantly so that we can perform an EKG in the office to determine if she has converted to sinus rhythm.  I will continue diltiazem 120 mg BID for now.  Importance of continued anticoagulation with rivaroxaban was stressed.  NICM: Ms. Stiegler appears euvolemic on exam with class I symptoms.  No medication changes will be made at this time.  Follow-up: Return to clinic 2 weeks after cardioversion.  Nelva Bush, MD 09/18/2019 9:40 AM

## 2019-09-18 ENCOUNTER — Encounter: Payer: Self-pay | Admitting: Internal Medicine

## 2019-09-22 ENCOUNTER — Other Ambulatory Visit: Payer: Self-pay | Admitting: *Deleted

## 2019-09-22 ENCOUNTER — Other Ambulatory Visit: Payer: Self-pay

## 2019-09-22 MED ORDER — DILTIAZEM HCL ER COATED BEADS 120 MG PO TB24: 120.0000 mg | ORAL_TABLET | Freq: Two times a day (BID) | ORAL | 0 refills | Status: DC

## 2019-09-22 NOTE — Telephone Encounter (Signed)
Diltiazem 120 mg tablet was sent in this morning for 1 in the am and 1 in the pm. Quantity 14  I spoke with pharmacy and they mentioned bc of the increase they needed verification from our office to confirm dose increase.  They have deleted previous Rx for dilitiazem 120 mg qd and will refill the 14 day new Rx.

## 2019-09-22 NOTE — Telephone Encounter (Signed)
Pt requesting refill and mentioned that Dr.End increased her dose to 1 tablet bid.  Pt doesn't understand if she will be continuing this medication and doesn't know if she should have this refilled for 30 days or 90 days. Pt mentioned that she took her last pill this morning and doesn't have anymore.  Pt is having issues with pharmacy not wanting to fill her medication. Please advise correct quantity for pt is pending cardioversion.

## 2019-09-22 NOTE — Telephone Encounter (Signed)
*  STAT* If patient is at the pharmacy, call can be transferred to refill team.   1. Which medications need to be refilled? (please list name of each medication and dose if known)  Daltiazem 2. Which pharmacy/location (including street and city if local pharmacy) is medication to be sent to? [facility] [address]   3. Do they need a 30 day or 90 day supply? [facility] only needs enough to get her through the 12th

## 2019-09-26 ENCOUNTER — Other Ambulatory Visit: Payer: Self-pay

## 2019-09-26 ENCOUNTER — Other Ambulatory Visit
Admission: RE | Admit: 2019-09-26 | Discharge: 2019-09-26 | Disposition: A | Discharge status: Home / Self Care | Payer: Medicare HMO | Source: Ambulatory Visit | Attending: Internal Medicine | Admitting: Internal Medicine

## 2019-09-26 DIAGNOSIS — Z01812 Encounter for preprocedural laboratory examination: Secondary | ICD-10-CM | POA: Insufficient documentation

## 2019-09-26 DIAGNOSIS — Z20822 Contact with and (suspected) exposure to covid-19: Secondary | ICD-10-CM | POA: Insufficient documentation

## 2019-09-26 LAB — SARS CORONAVIRUS 2 (TAT 6-24 HRS): SARS Coronavirus 2: NEGATIVE

## 2019-09-27 DIAGNOSIS — G4733 Obstructive sleep apnea (adult) (pediatric): Secondary | ICD-10-CM | POA: Diagnosis not present

## 2019-09-30 ENCOUNTER — Encounter: Payer: Self-pay | Admitting: Internal Medicine

## 2019-09-30 ENCOUNTER — Encounter
Admission: RE | Disposition: A | Discharge status: Home / Self Care | Payer: Self-pay | Source: Home / Self Care | Attending: Internal Medicine

## 2019-09-30 ENCOUNTER — Ambulatory Visit: Payer: Medicare HMO | Admitting: Anesthesiology

## 2019-09-30 ENCOUNTER — Ambulatory Visit
Admission: RE | Admit: 2019-09-30 | Discharge: 2019-09-30 | Disposition: A | Discharge status: Home / Self Care | Payer: Medicare HMO | Attending: Internal Medicine | Admitting: Internal Medicine

## 2019-09-30 ENCOUNTER — Other Ambulatory Visit: Payer: Self-pay

## 2019-09-30 DIAGNOSIS — I4892 Unspecified atrial flutter: Secondary | ICD-10-CM | POA: Diagnosis not present

## 2019-09-30 DIAGNOSIS — Z79899 Other long term (current) drug therapy: Secondary | ICD-10-CM | POA: Insufficient documentation

## 2019-09-30 DIAGNOSIS — Z87891 Personal history of nicotine dependence: Secondary | ICD-10-CM | POA: Diagnosis not present

## 2019-09-30 DIAGNOSIS — Z8249 Family history of ischemic heart disease and other diseases of the circulatory system: Secondary | ICD-10-CM | POA: Insufficient documentation

## 2019-09-30 DIAGNOSIS — I509 Heart failure, unspecified: Secondary | ICD-10-CM | POA: Diagnosis not present

## 2019-09-30 DIAGNOSIS — I4819 Other persistent atrial fibrillation: Secondary | ICD-10-CM | POA: Diagnosis not present

## 2019-09-30 DIAGNOSIS — I484 Atypical atrial flutter: Secondary | ICD-10-CM

## 2019-09-30 DIAGNOSIS — G4733 Obstructive sleep apnea (adult) (pediatric): Secondary | ICD-10-CM | POA: Insufficient documentation

## 2019-09-30 DIAGNOSIS — Z7901 Long term (current) use of anticoagulants: Secondary | ICD-10-CM | POA: Insufficient documentation

## 2019-09-30 DIAGNOSIS — I110 Hypertensive heart disease with heart failure: Secondary | ICD-10-CM | POA: Diagnosis not present

## 2019-09-30 DIAGNOSIS — F329 Major depressive disorder, single episode, unspecified: Secondary | ICD-10-CM | POA: Diagnosis not present

## 2019-09-30 DIAGNOSIS — I428 Other cardiomyopathies: Secondary | ICD-10-CM | POA: Diagnosis not present

## 2019-09-30 DIAGNOSIS — E785 Hyperlipidemia, unspecified: Secondary | ICD-10-CM | POA: Diagnosis not present

## 2019-09-30 HISTORY — PX: CARDIOVERSION: SHX1299

## 2019-09-30 SURGERY — CARDIOVERSION
Anesthesia: General

## 2019-09-30 MED ORDER — SODIUM CHLORIDE 0.9 % IV SOLN: INTRAVENOUS | Status: DC | PRN

## 2019-09-30 MED ORDER — PROPOFOL 10 MG/ML IV BOLUS
INTRAVENOUS | Status: DC | PRN
  Administered 2019-09-30: 50 mg via INTRAVENOUS
  Administered 2019-09-30: 20 mg via INTRAVENOUS

## 2019-09-30 MED ORDER — AMIODARONE HCL 200 MG PO TABS: 200.0000 mg | ORAL_TABLET | Freq: Every day | ORAL | Status: DC

## 2019-09-30 NOTE — Anesthesia Preprocedure Evaluation (Signed)
Anesthesia Evaluation  Patient identified by MRN, date of birth, ID band Patient awake    Reviewed: Allergy & Precautions, NPO status , Patient's Chart, lab work & pertinent test results  History of Anesthesia Complications Negative for: history of anesthetic complications  Airway Mallampati: II  TM Distance: >3 FB Neck ROM: Full    Dental  (+) Lower Dentures, Upper Dentures   Pulmonary sleep apnea and Continuous Positive Airway Pressure Ventilation , neg COPD, former smoker,    breath sounds clear to auscultation- rhonchi (-) wheezing      Cardiovascular hypertension, Pt. on medications +CHF (preserved EF)  (-) CAD, (-) Past MI, (-) Cardiac Stents and (-) CABG + dysrhythmias Atrial Fibrillation  Rhythm:Regular Rate:Normal - Systolic murmurs and - Diastolic murmurs Echo [id number]: 1. The left ventricle has low normal systolic function, with an ejection  fraction of 50-55%. The cavity size was normal. Left ventricular diastolic  Doppler parameters are consistent with impaired relaxation.  2. The right ventricle has normal systolc function. The cavity was  normal. There is no increase in right ventricular wall thickness. Unable  to estimate RVSP  3. Mitral valve regurgitation is mild to moderate by color flow Doppler.    Neuro/Psych neg Seizures PSYCHIATRIC DISORDERS Depression negative neurological ROS     GI/Hepatic negative GI ROS, Neg liver ROS,   Endo/Other  negative endocrine ROSneg diabetes  Renal/GU negative Renal ROS     Musculoskeletal negative musculoskeletal ROS (+)   Abdominal (+) + obese,   Peds  Hematology negative hematology ROS (+)   Anesthesia Other Findings Past Medical History: No date: Arrhythmia     Comment:  atrial fibrillation No date: CHF (congestive heart failure) (HCC) No date: Depression No date: Hyperlipidemia 1992: Menopausal syndrome (hot flashes)     Comment:  used HRT fo 2 yrs   No date: Obstructive sleep apnea No date: Persistent atrial fibrillation (HCC)   Reproductive/Obstetrics                             Anesthesia Physical Anesthesia Plan  ASA: III  Anesthesia Plan: General   Post-op Pain Management:    Induction: Intravenous  PONV Risk Score and Plan: 2 and Propofol infusion  Airway Management Planned: Natural Airway  Additional Equipment:   Intra-op Plan:   Post-operative Plan:   Informed Consent: I have reviewed the patients History and Physical, chart, labs and discussed the procedure including the risks, benefits and alternatives for the proposed anesthesia with the patient or authorized representative who has indicated his/her understanding and acceptance.     Dental advisory given  Plan Discussed with: CRNA and Anesthesiologist  Anesthesia Plan Comments:         Anesthesia Quick Evaluation

## 2019-09-30 NOTE — CV Procedure (Signed)
    Cardioversion Note  Melissa Bonilla [id number] [birth year]  Procedure: DC Cardioversion Indications: Atrial flutter  Procedure Details Consent: Obtained Time Out: Verified patient identification, verified procedure, site/side was marked, verified correct patient position, special equipment/implants available, Radiology Safety Procedures followed,  medications/allergies/relevent history reviewed, required imaging and test results available.  Performed  The patient has been on adequate anticoagulation.  The patient received IV propofol by anesthesia services for sedation.  Synchronous cardioversion was performed at 120 joules x 1.  The cardioversion was successful with restoration of normal sinus rhythm.  Complications: No apparent complications Patient did tolerate procedure well.  Nelva Bush., MD 09/30/2019, 7:52 AM

## 2019-09-30 NOTE — Anesthesia Postprocedure Evaluation (Signed)
Anesthesia Post Note  Patient: ALITZEL SIEVER  Procedure(s) Performed: CARDIOVERSION (N/A )  Patient location during evaluation: Other (specials recovery) Anesthesia Type: General Level of consciousness: awake and alert and oriented Pain management: pain level controlled Vital Signs Assessment: post-procedure vital signs reviewed and stable Respiratory status: spontaneous breathing, nonlabored ventilation and respiratory function stable Cardiovascular status: blood pressure returned to baseline and stable Postop Assessment: no signs of nausea or vomiting Anesthetic complications: no     Last Vitals:  Vitals:   09/30/19 0815 09/30/19 0830  BP: 124/77 127/75  Pulse: 66 68  Resp: 18 16  Temp:    SpO2: 95% 95%    Last Pain:  Vitals:   09/30/19 0830  PainSc: 0-No pain                 Justis Closser

## 2019-09-30 NOTE — Discharge Instructions (Signed)
Electrical Cardioversion Electrical cardioversion is the delivery of a jolt of electricity to restore a normal rhythm to the heart. A rhythm that is too fast or is not regular keeps the heart from pumping well. In this procedure, sticky patches or metal paddles are placed on the chest to deliver electricity to the heart from a device. This procedure may be done in an emergency if:  There is low or no blood pressure as a result of the heart rhythm.  Normal rhythm must be restored as fast as possible to protect the brain and heart from further damage.  It may save a life. This may also be a scheduled procedure for irregular or fast heart rhythms that are not immediately life-threatening. Tell a health care provider about:  Any allergies you have.  All medicines you are taking, including vitamins, herbs, eye drops, creams, and over-the-counter medicines.  Any problems you or family members have had with anesthetic medicines.  Any blood disorders you have.  Any surgeries you have had.  Any medical conditions you have.  Whether you are pregnant or may be pregnant. What are the risks? Generally, this is a safe procedure. However, problems may occur, including:  Allergic reactions to medicines.  A blood clot that breaks free and travels to other parts of your body.  The possible return of an abnormal heart rhythm within hours or days after the procedure.  Your heart stopping (cardiac arrest). This is rare. What happens before the procedure? Medicines  Your health care provider may have you start taking: ? Blood-thinning medicines (anticoagulants) so your blood does not clot as easily. ? Medicines to help stabilize your heart rate and rhythm.  Ask your health care provider about: ? Changing or stopping your regular medicines. This is especially important if you are taking diabetes medicines or blood thinners. ? Taking medicines such as aspirin and ibuprofen. These medicines can  thin your blood. Do not take these medicines unless your health care provider tells you to take them. ? Taking over-the-counter medicines, vitamins, herbs, and supplements. General instructions  Follow instructions from your health care provider about eating or drinking restrictions.  Plan to have someone take you home from the hospital or clinic.  If you will be going home right after the procedure, plan to have someone with you for 24 hours.  Ask your health care provider what steps will be taken to help prevent infection. These may include washing your skin with a germ-killing soap. What happens during the procedure?   An IV will be inserted into one of your veins.  Sticky patches (electrodes) or metal paddles may be placed on your chest.  You will be given a medicine to help you relax (sedative).  An electrical shock will be delivered. The procedure may vary among health care providers and hospitals. What can I expect after the procedure?  Your blood pressure, heart rate, breathing rate, and blood oxygen level will be monitored until you leave the hospital or clinic.  Your heart rhythm will be watched to make sure it does not change.  You may have some redness on the skin where the shocks were given. Follow these instructions at home:  Do not drive for 24 hours if you were given a sedative during your procedure.  Take over-the-counter and prescription medicines only as told by your health care provider.  Ask your health care provider how to check your pulse. Check it often.  Rest for 48 hours after the procedure or   as told by your health care provider.  Avoid or limit your caffeine use as told by your health care provider.  Keep all follow-up visits as told by your health care provider. This is important. Contact a health care provider if:  You feel like your heart is beating too quickly or your pulse is not regular.  You have a serious muscle cramp that does not go  away. Get help right away if:  You have discomfort in your chest.  You are dizzy or you feel faint.  You have trouble breathing or you are short of breath.  Your speech is slurred.  You have trouble moving an arm or leg on one side of your body.  Your fingers or toes turn cold or blue. Summary  Electrical cardioversion is the delivery of a jolt of electricity to restore a normal rhythm to the heart.  This procedure may be done right away in an emergency or may be a scheduled procedure if the condition is not an emergency.  Generally, this is a safe procedure.  After the procedure, check your pulse often as told by your health care provider. This information is not intended to replace advice given to you by your health care provider. Make sure you discuss any questions you have with your health care provider. Document Revised: 01/06/2019 Document Reviewed: 01/06/2019 Elsevier Patient Education  2020 [employer]  

## 2019-09-30 NOTE — Interval H&P Note (Signed)
History and Physical Interval Note:  09/30/2019 7:04 AM  Melissa Bonilla  has presented today for surgery, with the diagnosis of atrial flutter.  The various methods of treatment have been discussed with the patient and family. After consideration of risks, benefits and other options for treatment, the patient has consented to  Procedure(s): CARDIOVERSION (N/A) as a surgical intervention.  The patient's history has been reviewed, patient examined, no change in status, stable for surgery.  I have reviewed the patient's chart and labs.  Questions were answered to the patient's satisfaction.     Tinesha Siegrist

## 2019-09-30 NOTE — Transfer of Care (Signed)
Immediate Anesthesia Transfer of Care Note  Patient: Melissa Bonilla  Procedure(s) Performed: CARDIOVERSION (N/A )  Patient Location: Cath Lab  Anesthesia Type:General  Level of Consciousness: drowsy and responds to stimulation  Airway & Oxygen Therapy: Patient Spontanous Breathing and Patient connected to face mask oxygen  Post-op Assessment: Report given to RN and Post -op Vital signs reviewed and stable  Post vital signs: Reviewed and stable  Last Vitals:  Vitals Value Taken Time  BP 119/66 09/30/19 0742  Temp    Pulse 65 09/30/19 0743  Resp 21 09/30/19 0743  SpO2 95 % 09/30/19 0743    Last Pain:  Vitals:   09/30/19 0715  PainSc: 0-No pain         Complications: No apparent anesthesia complications

## 2019-10-08 DIAGNOSIS — G4733 Obstructive sleep apnea (adult) (pediatric): Secondary | ICD-10-CM | POA: Diagnosis not present

## 2019-10-15 ENCOUNTER — Encounter: Payer: Self-pay | Admitting: Internal Medicine

## 2019-10-15 ENCOUNTER — Ambulatory Visit (INDEPENDENT_AMBULATORY_CARE_PROVIDER_SITE_OTHER): Payer: Medicare HMO | Admitting: Internal Medicine

## 2019-10-15 ENCOUNTER — Other Ambulatory Visit: Payer: Self-pay

## 2019-10-15 VITALS — BP 140/90 | HR 67 | Ht 60.0 in | Wt 157.5 lb

## 2019-10-15 DIAGNOSIS — I4891 Unspecified atrial fibrillation: Secondary | ICD-10-CM

## 2019-10-15 DIAGNOSIS — I428 Other cardiomyopathies: Secondary | ICD-10-CM

## 2019-10-15 DIAGNOSIS — IMO0002 Reserved for concepts with insufficient information to code with codable children: Secondary | ICD-10-CM

## 2019-10-15 DIAGNOSIS — I10 Essential (primary) hypertension: Secondary | ICD-10-CM

## 2019-10-15 DIAGNOSIS — Z79899 Other long term (current) drug therapy: Secondary | ICD-10-CM | POA: Diagnosis not present

## 2019-10-15 DIAGNOSIS — I4892 Unspecified atrial flutter: Secondary | ICD-10-CM

## 2019-10-15 MED ORDER — LOSARTAN POTASSIUM 25 MG PO TABS: 12.5000 mg | ORAL_TABLET | Freq: Every day | ORAL | 1 refills | Status: DC

## 2019-10-15 NOTE — Progress Notes (Signed)
Follow-up Outpatient Visit Date: 10/15/2019  Primary Care Provider: Crecencio Mc, MD [address] [place] [address]  Chief Complaint: Follow-up atrial flutter  HPI:  Melissa Bonilla is a 74 y.o. female with history of persistent atrial fibrillation and atrial flutter, chronic HFrEF, hypertension, hyperlipidemia, depression, and remote tobacco use, who presents for follow-up of atrial flutter and chronic HFrEF.  I last saw Melissa Bonilla a month ago, which time she remained in atrial flutter with suboptimal rate control.  She subsequently underwent cardioversion with restoration of sinus rhythm 2 weeks ago.  Today, Melissa Bonilla reports that she is feeling much better following restoration of sinus rhythm.  She has more energy.  She denies chest pain, shortness of breath, palpitations, edema, and bleeding.  She had mild headache and lightheadedness over the weekend, wondering if it may have been due to pollen.  Home blood pressures have been 130-135/70-75.  She remains compliant with anticoagulation as well as amiodarone 200 mg daily.  --------------------------------------------------------------------------------------------------  Past Medical History:  Diagnosis Date  . Arrhythmia    atrial fibrillation  . CHF (congestive heart failure) ([place])   . Depression   . Hyperlipidemia   . Menopausal syndrome (hot flashes) 1992   used HRT fo 2 yrs   . Obstructive sleep apnea   . Persistent atrial fibrillation [facility])    Past Surgical History:  Procedure Laterality Date  . APPENDECTOMY  1972  . CARDIOVERSION N/A 11/28/2018   Procedure: CARDIOVERSION;  Surgeon: [name], MD;  Location: [facility] ORS;  Service: Cardiovascular;  Laterality: N/A;  . CARDIOVERSION N/A 12/23/2018   Procedure: CARDIOVERSION;  Surgeon: [name], MD;  Location: [facility] ORS;  Service: Cardiovascular;  Laterality: N/A;  . CARDIOVERSION N/A 09/30/2019   Procedure: CARDIOVERSION;  Surgeon: Nelva Bush, MD;  Location: [facility];  Service: Cardiovascular;  Laterality: N/A;  . CESAREAN SECTION     [id number], 1972  . TEE WITHOUT CARDIOVERSION N/A 11/28/2018   Procedure: TRANSESOPHAGEAL ECHOCARDIOGRAM (TEE);  Surgeon: [name], MD;  Location: [facility] ORS;  Service: Cardiovascular;  Laterality: N/A;    Current Meds  Medication Sig  . amiodarone (PACERONE) 200 MG tablet Take 1 tablet (200 mg total) by mouth daily.  . Cholecalciferol (VITAMIN D3) 1000 units CAPS Take 1,000 Units by mouth daily.  Marland Kitchen ELIQUIS 5 MG TABS tablet TAKE 1 TABLET TWICE DAILY (Patient taking differently: Take 5 mg by mouth 2 (two) times daily. )  . escitalopram (LEXAPRO) 10 MG tablet TAKE 1 TABLET (10 MG TOTAL) BY MOUTH DAILY. (Patient taking differently: Take 10 mg by mouth at bedtime. )  . Multiple Vitamin (MULTIVITAMIN WITH MINERALS) TABS tablet Take 1 tablet by mouth daily.  . vitamin B-12 (CYANOCOBALAMIN) 1000 MCG tablet Take 1,000 mcg by mouth daily.  . vitamin C (ASCORBIC ACID) 250 MG tablet Take 250 mg by mouth daily.    Allergies: Codeine, Morphine, and Sulfa antibiotics  Social History   Tobacco Use  . Smoking status: Former Smoker    Packs/day: 0.25    Years: 15.00    Pack years: 3.75    Types: Cigarettes    Quit date: 1992    Years since quitting: 29.3  . Smokeless tobacco: Never Used  Substance Use Topics  . Alcohol use: Not Currently  . Drug use: No    Family History  Problem Relation Age of Onset  . Arthritis Mother        Rheumatoid  . Heart disease Mother  Valvular  . Hypertension Mother   . Heart disease Father 47       CABG  . Arthritis Maternal Grandmother        Rheumatoid  . Hypertension Maternal Grandmother   . Stroke Maternal Grandmother   . Breast cancer Paternal Aunt 52  . Breast cancer Paternal Aunt 29    Review of Systems: A 12-system review of systems was performed and was negative except as noted in the  HPI.  --------------------------------------------------------------------------------------------------  Physical Exam: BP 140/90 (BP Location: Left Arm, Patient Position: Sitting, Cuff Size: Normal)   Pulse 67   Ht 5' (1.524 m)   Wt 157 lb 8 oz (71.4 kg)   SpO2 93%   BMI 30.76 kg/m   Repeat BP 142/78  General: NAD. Neck: No JVD or HJR. Lungs: Clear to auscultation bilaterally without wheezes or crackles. Heart: Regular rate and rhythm without murmurs. Abdomen: Soft, nontender, nondistended. Extremities: No lower extremity edema.  EKG: Normal sinus rhythm with anterior ST/T changes.  No significant change from prior tracing on 09/30/2019.  Lab Results  Component Value Date   WBC 5.8 09/10/2019   HGB 14.5 09/10/2019   HCT 43.1 09/10/2019   MCV 92 09/10/2019   PLT 212 09/10/2019    Lab Results  Component Value Date   NA 138 09/10/2019   K 4.9 09/10/2019   CL 100 09/10/2019   CO2 24 09/10/2019   BUN 16 09/10/2019   CREATININE 0.88 09/10/2019   GLUCOSE 81 09/10/2019   ALT 15 09/10/2019    Lab Results  Component Value Date   CHOL 145 11/26/2018   HDL 39 (L) 11/26/2018   LDLCALC 95 11/26/2018   LDLDIRECT 175.5 03/19/2013   TRIG 54 11/26/2018   CHOLHDL 3.7 11/26/2018    --------------------------------------------------------------------------------------------------  ASSESSMENT AND PLAN: Atrial fibrillation/flutter: Melissa Bonilla is maintaining sinus rhythm following cardioversion 2 weeks ago.  She reports improved energy with restoration of sinus rhythm.  We will continue amiodarone 200 mg daily as well as indefinite anticoagulation with apixaban.  Melissa Bonilla is scheduled for consultation with Dr. Caryl Comes next month to assist with long-term rhythm management, given recurrent atrial fibrillation/flutter.  Hopefully, we can avoid indefinite amiodarone use.  Cardiomyopathy: LVEF had been severely reduced at the time that atrial fibrillation was first diagnosed last  summer, though LVEF improved with restoration of sinus rhythm and goal-directed medical therapy.  Given some lightheadedness and recurrent atrial flutter, I have recommended that we repeat an echocardiogram to reassess her systolic function.  If it is normal, I would favor performing a myocardial perfusion stress test or cardiac CTA to exclude concurrent ischemic heart disease, particularly since she has persistent anterior ST/T changes on her EKG following recent DCCV.  If LVEF is low, I would favor catheterization instead.  We will restart low-dose losartan, as outlined below.  Hypertension: Blood pressure mildly elevated today.  We have agreed to start losartan 12.5 mg daily and check a basic metabolic panel when Melissa Bonilla is seen for EP evaluation with Dr. Caryl Comes in mid May.  Follow-up: Return to clinic in 3 months.  Nelva Bush, MD 10/15/2019 3:00 PM

## 2019-10-15 NOTE — Patient Instructions (Addendum)
Medication Instructions:  Your physician has recommended you make the following change in your medication:  1- START Losartan 12.5 mg (0.5 tablet) by mouth once a day.  *If you need a refill on your cardiac medications before your next appointment, please call your pharmacy*   Lab Work: Your physician recommends that you return for lab work at appointment when you see Dr Caryl Comes in May for BMET.   If you have labs (blood work) drawn today and your tests are completely normal, you will receive your results only by: Marland Kitchen MyChart Message (if you have MyChart) OR . A paper copy in the mail If you have any lab test that is abnormal or we need to change your treatment, we will call you to review the results.   Testing/Procedures: Your physician has requested that you have an echocardiogram. Echocardiography is a painless test that uses sound waves to create images of your heart. It provides your doctor with information about the size and shape of your heart and how well your heart's chambers and valves are working. This procedure takes approximately one hour. There are no restrictions for this procedure. You may get an IV, if needed, to receive an ultrasound enhancing agent through to better visualize your heart.    Follow-Up: At [facility], you and your health needs are our priority.  As part of our continuing mission to provide you with exceptional heart care, we have created designated Provider Care Teams.  These Care Teams include your primary Cardiologist (physician) and Advanced Practice Providers (APPs -  Physician Assistants and Nurse Practitioners) who all work together to provide you with the care you need, when you need it.  We recommend signing up for the patient portal called "MyChart".  Sign up information is provided on this After Visit Summary.  MyChart is used to connect with patients for Virtual Visits (Telemedicine).  Patients are able to view lab/test results, encounter notes,  upcoming appointments, etc.  Non-urgent messages can be sent to your provider as well.   To learn more about what you can do with MyChart, go to [email or web address].    Your next appointment:   3 month(s)  The format for your next appointment:   In Person  Provider:    You may see Nelva Bush, MD or one of the following Advanced Practice Providers on your designated Care Team:    Murray Hodgkins, NP  Christell Faith, PA-C  Marrianne Mood, PA-C

## 2019-10-16 ENCOUNTER — Encounter: Payer: Self-pay | Admitting: Internal Medicine

## 2019-10-27 DIAGNOSIS — G4733 Obstructive sleep apnea (adult) (pediatric): Secondary | ICD-10-CM | POA: Diagnosis not present

## 2019-10-29 NOTE — Progress Notes (Signed)
ELECTROPHYSIOLOGY CONSULT NOTE  Patient ID: Melissa Bonilla, MRN: [id number], DOB/AGE: [birth year] 74 y.o. Admit date: (Not on file) Date of Consult: 10/30/2019  Primary Physician: Crecencio Mc, MD Primary Cardiologist: CE     Melissa Bonilla is a 74 y.o. female who is being seen today for the evaluation of atrial fib at the request of End.    HPI Melissa Bonilla is a 74 y.o. female with a history of recurrent atrial fibrillation/flutter associated with rapid ventricular rate originally diagnosed 6/20 when she presented with dyspnea and fatigue with some palpitations.  Underwent cardioversion with rapid return to atrial fibrillation/flutter.  Had spontaneous conversion 7/20.  Had recurrent tachycardia 3/21 and was started on amiodarone and underwent repeat cardioversion 4/21 and is holding sinus rhythm.  She actually said that she felt better and tachycardia with her rates at 130 that she did following cardioversion.  She has felt better over the last few weeks, back to her normal.  She has been maintained on amiodarone.  Patient denies symptoms of GI intolerance, neurological symptoms attributable to amiodarone.  Does have symptoms of sun sensitivity surveillance laboratories were in normal limits at the time of drug initiation  The patient denies chest pain, shortness of breath, nocturnal dyspnea, orthopnea or peripheral edema.  There have been no palpitations, lightheadedness or syncope. \\  Great deal of stress as she takes care of her mom who lives next door was in her 46s  No known snoring  DATE TEST EF   6/20 Echo   35-40 %   8/20 Echo   55-60 % LA/RA size normal        Date Cr K Hgb  3/21 0.88 4.9 14.5         Thromboembolic risk factors ( age -53, CHF-1, Gender-1) for a CHADSVASc Score of >=3    Past Medical History:  Diagnosis Date  . Arrhythmia    atrial fibrillation  . CHF (congestive heart failure) ([place])   . Depression   . Hyperlipidemia   . Menopausal  syndrome (hot flashes) 1992   used HRT fo 2 yrs   . Obstructive sleep apnea   . Persistent atrial fibrillation [facility])       Surgical History:  Past Surgical History:  Procedure Laterality Date  . APPENDECTOMY  1972  . CARDIOVERSION N/A 11/28/2018   Procedure: CARDIOVERSION;  Surgeon: [name], MD;  Location: [facility] ORS;  Service: Cardiovascular;  Laterality: N/A;  . CARDIOVERSION N/A 12/23/2018   Procedure: CARDIOVERSION;  Surgeon: [name], MD;  Location: [facility] ORS;  Service: Cardiovascular;  Laterality: N/A;  . CARDIOVERSION N/A 09/30/2019   Procedure: CARDIOVERSION;  Surgeon: Nelva Bush, MD;  Location: [place] ORS;  Service: Cardiovascular;  Laterality: N/A;  . CESAREAN SECTION     [id number], 1972  . TEE WITHOUT CARDIOVERSION N/A 11/28/2018   Procedure: TRANSESOPHAGEAL ECHOCARDIOGRAM (TEE);  Surgeon: [name], MD;  Location: [facility] ORS;  Service: Cardiovascular;  Laterality: N/A;     Home Meds: Current Meds  Medication Sig  . amiodarone (PACERONE) 200 MG tablet Take 1 tablet (200 mg total) by mouth daily.  . Cholecalciferol (VITAMIN D3) 1000 units CAPS Take 1,000 Units by mouth daily.  Marland Kitchen ELIQUIS 5 MG TABS tablet TAKE 1 TABLET TWICE DAILY (Patient taking differently: Take 5 mg by mouth 2 (two) times daily. )  . escitalopram (LEXAPRO) 10 MG tablet TAKE 1 TABLET (10 MG TOTAL) BY MOUTH DAILY. (Patient taking differently: Take  10 mg by mouth at bedtime. )  . losartan (COZAAR) 25 MG tablet Take 0.5 tablets (12.5 mg total) by mouth daily.  . Multiple Vitamin (MULTIVITAMIN WITH MINERALS) TABS tablet Take 1 tablet by mouth daily.  . vitamin B-12 (CYANOCOBALAMIN) 1000 MCG tablet Take 1,000 mcg by mouth daily.  . vitamin C (ASCORBIC ACID) 250 MG tablet Take 250 mg by mouth daily.    Allergies:  Allergies  Allergen Reactions  . Codeine Nausea Only and Rash  . Morphine Nausea And Vomiting    Pt allergy may be codeine as codeine or morphine were combined on previous  health history. Pt reported upset stomach on questionnaire.  . Sulfa Antibiotics Nausea And Vomiting    Social History   Socioeconomic History  . Marital status: Married    Spouse name: Not on file  . Number of children: Not on file  . Years of education: Not on file  . Highest education level: Not on file  Occupational History    Employer: self    Comment: Owner of a cleaning Business  Tobacco Use  . Smoking status: Former Smoker    Packs/day: 0.25    Years: 15.00    Pack years: 3.75    Types: Cigarettes    Quit date: 1992    Years since quitting: 29.3  . Smokeless tobacco: Never Used  Substance and Sexual Activity  . Alcohol use: Not Currently  . Drug use: No  . Sexual activity: Not on file  Other Topics Concern  . Not on file  Social History Narrative  . Not on file   Social Determinants of Health   Financial Resource Strain: Low Risk   . Difficulty of Paying Living Expenses: Not hard at all  Food Insecurity: No Food Insecurity  . Worried About Charity fundraiser in the Last Year: Never true  . Ran Out of Food in the Last Year: Never true  Transportation Needs: No Transportation Needs  . Lack of Transportation (Medical): No  . Lack of Transportation (Non-Medical): No  Physical Activity:   . Days of Exercise per Week:   . Minutes of Exercise per Session:   Stress: No Stress Concern Present  . Feeling of Stress : Not at all  Social Connections:   . Frequency of Communication with Friends and Family:   . Frequency of Social Gatherings with Friends and Family:   . Attends Religious Services:   . Active Member of Clubs or Organizations:   . Attends Archivist Meetings:   Marland Kitchen Marital Status:   Intimate Partner Violence: Not At Risk  . Fear of Current or Ex-Partner: No  . Emotionally Abused: No  . Physically Abused: No  . Sexually Abused: No     Family History  Problem Relation Age of Onset  . Arthritis Mother        Rheumatoid  . Heart disease  Mother        Valvular  . Hypertension Mother   . Heart disease Father 68       CABG  . Arthritis Maternal Grandmother        Rheumatoid  . Hypertension Maternal Grandmother   . Stroke Maternal Grandmother   . Breast cancer Paternal Aunt 3  . Breast cancer Paternal Aunt 65     ROS:  Please see the history of present illness.     All other systems reviewed and negative.    Physical Exam: Blood pressure (!) 160/72, pulse (!) 54, height  5' (1.524 m), weight 154 lb (69.9 kg). General: Well developed, well nourished female in no acute distress. Head: Normocephalic, atraumatic, sclera non-icteric, no xanthomas, nares are without discharge. EENT: normal  Lymph Nodes:  none Neck: Negative for carotid bruits. JVD not elevated. Back:without scoliosis kyphosis  Lungs: Clear bilaterally to auscultation without wheezes, rales, or rhonchi. Breathing is unlabored. Heart: RRR with S1 S2. No  murmur . No rubs, or gallops appreciated. Abdomen: Soft, non-tender, non-distended with normoactive bowel sounds. No hepatomegaly. No rebound/guarding. No obvious abdominal masses. Msk:  Strength and tone appear normal for age. Extremities: No clubbing or cyanosis. No edema.  Distal pedal pulses are 2+ and equal bilaterally. Skin: Warm and Dry Neuro: Alert and oriented X 3. CN III-XII intact Grossly normal sensory and motor function . Psych:  Responds to questions appropriately with a normal affect.      Labs: Cardiac Enzymes No results for input(s): CKTOTAL, CKMB, TROPONINI in the last 72 hours. CBC Lab Results  Component Value Date   WBC 5.8 09/10/2019   HGB 14.5 09/10/2019   HCT 43.1 09/10/2019   MCV 92 09/10/2019   PLT 212 09/10/2019   PROTIME: No results for input(s): LABPROT, INR in the last 72 hours. Chemistry No results for input(s): NA, K, CL, CO2, BUN, CREATININE, CALCIUM, PROT, BILITOT, ALKPHOS, ALT, AST, GLUCOSE in the last 168 hours.  Invalid input(s): LABALBU Lipids Lab Results   Component Value Date   CHOL 145 11/26/2018   HDL 39 (L) 11/26/2018   LDLCALC 95 11/26/2018   TRIG 54 11/26/2018   BNP No results found for: PROBNP Thyroid Function Tests: No results for input(s): TSH, T4TOTAL, T3FREE, THYROIDAB in the last 72 hours.  Invalid input(s): FREET3 Miscellaneous No results found for: DDIMER  Radiology/Studies:  No results found.  EKG: Sinus at 54 Interval 17/08/45 Axis 0 T wave inversions V2-V4 4/21--Aflutter Atypical ACL 260 msec 3/21 Aflutter Atypical Upright V1 and 2,3,f  ACL 240 msec 6/20--aFIB RVR 165  Assessment and Plan:  Atrial fib/flutter  Cardiomyopathy--reversible--rate related  High Risk Medication Surveillance Amiodarone  Sinus bradycardia  Abnormal ECG    The patient has had a few episodes of atrial fibrillation/atypical flutter over the last year.  Currently she is on amiodarone.  Interestingly, she felt better in her atrial flutter at a rate of 130 that she does in sinus rhythm.  It may be that her sinus bradycardia is chronotropically limited.  We discussed alternative strategies to the management of her atrial fibrillation that were not pharmacological.  Specifically we discussed catheter ablation.  She would like to pursue such, however, she would like to lose weight first and see if this has any impact on her atrial fibrillation burden.  Hence, we have agreed to was to allow her to do so, we will revisit things in about 3 months.  At that juncture we will decide whether to pursue catheter ablation or not.  Indeed her episodes of atrial fibrillation are sufficiently infrequent that it alternative strategy would be to stop the drug altogether and allow her self to to be treated with repeat cardioversion as necessary and let the frequency of that determine the need for further steps.  She is inclined toward that strategy.  We also discussed the option of continuing amiodarone or changing her to dofetilide.  Reviewed the risks  and benefits of the amiodarone and will check her surveillance laboratories today.    Virl Axe

## 2019-10-30 ENCOUNTER — Ambulatory Visit (INDEPENDENT_AMBULATORY_CARE_PROVIDER_SITE_OTHER): Payer: Medicare HMO | Admitting: Internal Medicine

## 2019-10-30 ENCOUNTER — Encounter: Payer: Self-pay | Admitting: Internal Medicine

## 2019-10-30 ENCOUNTER — Other Ambulatory Visit: Payer: Self-pay

## 2019-10-30 VITALS — BP 160/72 | HR 54 | Ht 60.0 in | Wt 154.0 lb

## 2019-10-30 DIAGNOSIS — I4891 Unspecified atrial fibrillation: Secondary | ICD-10-CM

## 2019-10-30 DIAGNOSIS — I4892 Unspecified atrial flutter: Secondary | ICD-10-CM

## 2019-10-30 DIAGNOSIS — Z79899 Other long term (current) drug therapy: Secondary | ICD-10-CM | POA: Diagnosis not present

## 2019-10-30 DIAGNOSIS — IMO0002 Reserved for concepts with insufficient information to code with codable children: Secondary | ICD-10-CM

## 2019-10-30 NOTE — Patient Instructions (Signed)
Medication Instructions:  - Your physician recommends that you continue on your current medications as directed. Please refer to the Current Medication list given to you today.  *If you need a refill on your cardiac medications before your next appointment, please call your pharmacy*   Lab Work: - Your physician recommends that you have lab work today: CMET/ TSH  If you have labs (blood work) drawn today and your tests are completely normal, you will receive your results only by: Marland Kitchen MyChart Message (if you have MyChart) OR . A paper copy in the mail If you have any lab test that is abnormal or we need to change your treatment, we will call you to review the results.   Testing/Procedures: - none ordered   Follow-Up: At [facility], you and your health needs are our priority.  As part of our continuing mission to provide you with exceptional heart care, we have created designated Provider Care Teams.  These Care Teams include your primary Cardiologist (physician) and Advanced Practice Providers (APPs -  Physician Assistants and Nurse Practitioners) who all work together to provide you with the care you need, when you need it.  We recommend signing up for the patient portal called "MyChart".  Sign up information is provided on this After Visit Summary.  MyChart is used to connect with patients for Virtual Visits (Telemedicine).  Patients are able to view lab/test results, encounter notes, upcoming appointments, etc.  Non-urgent messages can be sent to your provider as well.   To learn more about what you can do with MyChart, go to [email or web address].    Your next appointment:   3 month(s)  The format for your next appointment:   In Person  Provider:   Virl Axe, MD   Other Instructions n/a

## 2019-10-31 LAB — COMPREHENSIVE METABOLIC PANEL
ALT: 16 IU/L (ref 0–32)
AST: 22 IU/L (ref 0–40)
Albumin/Globulin Ratio: 1.3 (ref 1.2–2.2)
Albumin: 3.9 g/dL (ref 3.7–4.7)
Alkaline Phosphatase: 130 IU/L — ABNORMAL HIGH (ref 39–117)
BUN/Creatinine Ratio: 17 (ref 12–28)
BUN: 14 mg/dL (ref 8–27)
Bilirubin Total: 0.3 mg/dL (ref 0.0–1.2)
CO2: 25 mmol/L (ref 20–29)
Calcium: 9.3 mg/dL (ref 8.7–10.3)
Chloride: 105 mmol/L (ref 96–106)
Creatinine, Ser: 0.83 mg/dL (ref 0.57–1.00)
GFR calc Af Amer: 81 mL/min/{1.73_m2} (ref >59)
GFR calc non Af Amer: 70 mL/min/{1.73_m2} (ref >59)
Globulin, Total: 3 g/dL (ref 1.5–4.5)
Glucose: 85 mg/dL (ref 65–99)
Potassium: 4.9 mmol/L (ref 3.5–5.2)
Sodium: 140 mmol/L (ref 134–144)
Total Protein: 6.9 g/dL (ref 6.0–8.5)

## 2019-10-31 LAB — TSH: TSH: 2.29 u[IU]/mL (ref 0.450–4.500)

## 2019-11-20 ENCOUNTER — Other Ambulatory Visit: Payer: Self-pay | Admitting: Internal Medicine

## 2019-11-20 MED ORDER — AMIODARONE HCL 200 MG PO TABS: 200.0000 mg | ORAL_TABLET | Freq: Every day | ORAL | 0 refills | Status: DC

## 2019-11-20 NOTE — Telephone Encounter (Signed)
*  STAT* If patient is at the pharmacy, call can be transferred to refill team.   1. Which medications need to be refilled? (please list name of each medication and dose if known) amiodarone 200 mg  2. Which pharmacy/location (including street and city if local pharmacy) is medication to be sent to? [facility]  3. Do they need a 30 day or 90 day supply? [place]

## 2019-11-20 NOTE — Telephone Encounter (Signed)
Requested Prescriptions   Signed Prescriptions Disp Refills   amiodarone (PACERONE) 200 MG tablet 90 tablet 0    Sig: Take 1 tablet (200 mg total) by mouth daily.    Authorizing Provider: END, CHRISTOPHER    Ordering User: Demarious Kapur C    

## 2019-11-26 ENCOUNTER — Other Ambulatory Visit: Payer: Self-pay

## 2019-11-26 ENCOUNTER — Ambulatory Visit (INDEPENDENT_AMBULATORY_CARE_PROVIDER_SITE_OTHER): Payer: Medicare HMO

## 2019-11-26 DIAGNOSIS — IMO0002 Reserved for concepts with insufficient information to code with codable children: Secondary | ICD-10-CM

## 2019-11-26 DIAGNOSIS — I4891 Unspecified atrial fibrillation: Secondary | ICD-10-CM

## 2019-11-26 DIAGNOSIS — I428 Other cardiomyopathies: Secondary | ICD-10-CM

## 2019-11-26 DIAGNOSIS — I4892 Unspecified atrial flutter: Secondary | ICD-10-CM

## 2019-11-27 DIAGNOSIS — G4733 Obstructive sleep apnea (adult) (pediatric): Secondary | ICD-10-CM | POA: Diagnosis not present

## 2019-12-01 ENCOUNTER — Telehealth: Payer: Self-pay | Admitting: *Deleted

## 2019-12-01 DIAGNOSIS — I4891 Unspecified atrial fibrillation: Secondary | ICD-10-CM

## 2019-12-01 NOTE — Telephone Encounter (Signed)
-----   Message from [place].Daleen Bo, RN sent at 12/01/2019  9:08 AM EDT -----  ----- Message ----- From: Nelva Bush, MD Sent: 11/27/2019  12:05 PM EDT To: Rebeca Alert [facility] Triage  Please let Ms. Birchard know that her echo shows that her heart is stiff but contracting normally.  I recommend that we obtain a cardiac CTA to exclude concomitant CAD.  As her resting HR has been in the 50's and 60's, I do not recommend any oral metoprolol to be taken prior to CTA.

## 2019-12-01 NOTE — Telephone Encounter (Signed)
Results called to pt. Pt verbalized understanding. She is agreeable to the coronary CTA.  She verbalized understanding of the following instructions. Message sent to scheduler and precert.    Your cardiac CT will be scheduled at one of the below locations:   [facility] [address] [place], [place] [address] [phone]  [place] [address] [place],  [address] [phone]  If scheduled at [facility], please arrive at the [facility] main entrance of [facility] 30 minutes prior to test start time. Proceed to the [facility] Radiology Department (first floor) to check-in and test prep.  If scheduled at [facility], please arrive 15 mins early for check-in and test prep.  Please follow these instructions carefully (unless otherwise directed):  On the Night Before the Test: . Be sure to Drink plenty of water. . Do not consume any caffeinated/decaffeinated beverages or chocolate 12 hours prior to your test. . Do not take any antihistamines 12 hours prior to your test.  On the Day of the Test: . Drink plenty of water. Do not drink any water within one hour of the test. . Do not eat any food 4 hours prior to the test. . You may take your regular medications prior to the test.  . HOLD Furosemide/Hydrochlorothiazide morning of the test. . FEMALES- please wear underwire-free bra if available      After the Test: . Drink plenty of water. . After receiving IV contrast, you may experience a mild flushed feeling. This is normal. . On occasion, you may experience a mild rash up to 24 hours after the test. This is not dangerous. If this occurs, you can take Benadryl 25 mg and increase your fluid intake. . If you experience trouble breathing, this can be serious. If it is severe call 911 IMMEDIATELY. If it is mild, please call our office. . If you take any of these  medications: Glipizide/Metformin, Avandament, Glucavance, please do not take 48 hours after completing test unless otherwise instructed.   Once we have confirmed authorization from your insurance company, we will call you to set up a date and time for your test.   For non-scheduling related questions, please contact the cardiac imaging nurse navigator should you have any questions/concerns: Marchia Bond, Cardiac Imaging Nurse Navigator Burley Saver, Interim Cardiac Imaging Nurse [place] [facility] Direct Office Dial: [phone]   For scheduling needs, including cancellations and rescheduling, please call [phone].

## 2019-12-17 ENCOUNTER — Other Ambulatory Visit: Payer: Self-pay | Admitting: *Deleted

## 2019-12-17 DIAGNOSIS — I4819 Other persistent atrial fibrillation: Secondary | ICD-10-CM

## 2019-12-17 DIAGNOSIS — Z01810 Encounter for preprocedural cardiovascular examination: Secondary | ICD-10-CM

## 2019-12-19 ENCOUNTER — Other Ambulatory Visit
Admission: RE | Admit: 2019-12-19 | Discharge: 2019-12-19 | Disposition: A | Discharge status: Home / Self Care | Payer: Medicare HMO | Source: Ambulatory Visit | Attending: Internal Medicine | Admitting: Internal Medicine

## 2019-12-19 DIAGNOSIS — I4819 Other persistent atrial fibrillation: Secondary | ICD-10-CM

## 2019-12-19 DIAGNOSIS — Z01810 Encounter for preprocedural cardiovascular examination: Secondary | ICD-10-CM | POA: Insufficient documentation

## 2019-12-19 LAB — BASIC METABOLIC PANEL
Anion gap: 11 (ref 5–15)
BUN: 14 mg/dL (ref 8–23)
CO2: 24 mmol/L (ref 22–32)
Calcium: 8.9 mg/dL (ref 8.9–10.3)
Chloride: 102 mmol/L (ref 98–111)
Creatinine, Ser: 1.03 mg/dL — ABNORMAL HIGH (ref 0.44–1.00)
GFR calc Af Amer: >60 mL/min (ref >60)
GFR calc non Af Amer: 54 mL/min — ABNORMAL LOW (ref >60)
Glucose, Bld: 87 mg/dL (ref 70–99)
Potassium: 4.7 mmol/L (ref 3.5–5.1)
Sodium: 137 mmol/L (ref 135–145)

## 2019-12-24 ENCOUNTER — Telehealth (HOSPITAL_COMMUNITY): Payer: Self-pay | Admitting: *Deleted

## 2019-12-24 NOTE — Telephone Encounter (Signed)

## 2019-12-25 ENCOUNTER — Other Ambulatory Visit: Payer: Self-pay

## 2019-12-25 ENCOUNTER — Ambulatory Visit
Admission: RE | Admit: 2019-12-25 | Discharge: 2019-12-25 | Disposition: A | Discharge status: Home / Self Care | Payer: Medicare HMO | Source: Ambulatory Visit | Attending: Internal Medicine | Admitting: Internal Medicine

## 2019-12-25 DIAGNOSIS — I4891 Unspecified atrial fibrillation: Secondary | ICD-10-CM

## 2019-12-25 DIAGNOSIS — I429 Cardiomyopathy, unspecified: Secondary | ICD-10-CM | POA: Diagnosis not present

## 2019-12-25 HISTORY — DX: Essential (primary) hypertension: I10

## 2019-12-25 MED ORDER — NITROGLYCERIN 0.4 MG SL SUBL
0.8000 mg | SUBLINGUAL_TABLET | Freq: Once | SUBLINGUAL | Status: AC
  Administered 2019-12-25: 0.8 mg via SUBLINGUAL

## 2019-12-25 MED ORDER — IOHEXOL 350 MG/ML SOLN
100.0000 mL | Freq: Once | INTRAVENOUS | Status: AC | PRN
  Administered 2019-12-25: 100 mL via INTRAVENOUS

## 2019-12-25 NOTE — Progress Notes (Signed)
Patient tolerated CT well. Drank decaffeinated coffee and gave a bottle to water to drink. Ambulated to exit steady gait.

## 2019-12-27 DIAGNOSIS — G4733 Obstructive sleep apnea (adult) (pediatric): Secondary | ICD-10-CM | POA: Diagnosis not present

## 2020-01-07 DIAGNOSIS — G4733 Obstructive sleep apnea (adult) (pediatric): Secondary | ICD-10-CM | POA: Diagnosis not present

## 2020-01-15 DIAGNOSIS — G4733 Obstructive sleep apnea (adult) (pediatric): Secondary | ICD-10-CM | POA: Diagnosis not present

## 2020-01-21 ENCOUNTER — Ambulatory Visit: Payer: Medicare HMO | Admitting: Physician Assistant

## 2020-01-27 DIAGNOSIS — G4733 Obstructive sleep apnea (adult) (pediatric): Secondary | ICD-10-CM | POA: Diagnosis not present

## 2020-01-28 ENCOUNTER — Other Ambulatory Visit: Payer: Self-pay | Admitting: Internal Medicine

## 2020-02-05 ENCOUNTER — Ambulatory Visit: Payer: Medicare HMO | Admitting: Internal Medicine

## 2020-02-05 ENCOUNTER — Other Ambulatory Visit: Payer: Self-pay

## 2020-02-05 ENCOUNTER — Encounter: Payer: Self-pay | Admitting: Internal Medicine

## 2020-02-05 VITALS — BP 144/66 | HR 56 | Ht 60.0 in | Wt 154.0 lb

## 2020-02-05 DIAGNOSIS — I5022 Chronic systolic (congestive) heart failure: Secondary | ICD-10-CM | POA: Diagnosis not present

## 2020-02-05 DIAGNOSIS — I428 Other cardiomyopathies: Secondary | ICD-10-CM

## 2020-02-05 DIAGNOSIS — I4891 Unspecified atrial fibrillation: Secondary | ICD-10-CM

## 2020-02-05 DIAGNOSIS — I4892 Unspecified atrial flutter: Secondary | ICD-10-CM

## 2020-02-05 DIAGNOSIS — IMO0002 Reserved for concepts with insufficient information to code with codable children: Secondary | ICD-10-CM

## 2020-02-05 DIAGNOSIS — Z79899 Other long term (current) drug therapy: Secondary | ICD-10-CM

## 2020-02-05 NOTE — Patient Instructions (Addendum)
Medication Instructions:  - Your physician recommends that you continue on your current medications as directed. Please refer to the Current Medication list given to you today.  *If you need a refill on your cardiac medications before your next appointment, please call your pharmacy*   Lab Work: - none ordered  If you have labs (blood work) drawn today and your tests are completely normal, you will receive your results only by: Marland Kitchen MyChart Message (if you have MyChart) OR . A paper copy in the mail If you have any lab test that is abnormal or we need to change your treatment, we will call you to review the results.   Testing/Procedures: - none ordered   Follow-Up: At [facility], you and your health needs are our priority.  As part of our continuing mission to provide you with exceptional heart care, we have created designated Provider Care Teams.  These Care Teams include your primary Cardiologist (physician) and Advanced Practice Providers (APPs -  Physician Assistants and Nurse Practitioners) who all work together to provide you with the care you need, when you need it.  We recommend signing up for the patient portal called "MyChart".  Sign up information is provided on this After Visit Summary.  MyChart is used to connect with patients for Virtual Visits (Telemedicine).  Patients are able to view lab/test results, encounter notes, upcoming appointments, etc.  Non-urgent messages can be sent to your provider as well.   To learn more about what you can do with MyChart, go to [email or web address].    Your next appointment:   - You have been referred to : Dr. Lars Mage for consideration of atrial fibrillation ablation    The format for your next appointment:   In Person  Provider:   As above   Other Instructions n/a

## 2020-02-05 NOTE — Progress Notes (Signed)
[name], MD as PCP - General (Internal Medicine) End, [name], MD as PCP - Cardiology (Cardiology) [name], MD as Consulting Physician (Cardiology)   HPI  Melissa Bonilla is a 74 y.o. female Seen in follow-up for history of recurrent atrial arrhythmias originally diagnosed 6/20.  Most recently underwent cardioversion 4/21 and is holding sinus rhythm.  Was started on amiodarone because of rapid recurrence of atrial arrhythmias.  Her impression that she felt better with rates in the 130s that she did in sinus.  Question chronotropic incompetence.  We discussed previously catheter ablation; she wanted to try to lose weight first.  She is here to review that.  Infrequent recurrent atrial fib The patient denies chest pain, shortness of breath, nocturnal dyspnea, orthopnea or peripheral edema.  There have been no, lightheadedness or syncope  Treated sleep apnea  Patient denies symptoms of GI intolerance, neurological symptoms attributable to amiodarone.  She has however having problem with sun sensitivity    DATE TEST EF   6/20 Echo   35-40 %   8/20 Echo   55-60 % LA/RA size normal        Date Cr K TSH LFTs Hgb  5/21 0.83 4.9 2.29 16 14.5        Thromboembolic risk factors ( age -79, CHF-1, Gender-1) for a CHADSVASc Score of >=3     Finding a better balance and helping to care for her mother.  Her son and grandson were involved.   Records and Results Reviewed   Past Medical History:  Diagnosis Date  . Arrhythmia    atrial fibrillation  . CHF (congestive heart failure) ([place])   . Depression   . Hyperlipidemia   . Hypertension   . Menopausal syndrome (hot flashes) 1992   used HRT fo 2 yrs   . Obstructive sleep apnea   . Persistent atrial fibrillation [facility]     Past Surgical History:  Procedure Laterality Date  . APPENDECTOMY  1972  . CARDIOVERSION N/A 11/28/2018   Procedure: CARDIOVERSION;  Surgeon: [name], MD;  Location: [facility] ORS;  Service: Cardiovascular;  Laterality: N/A;  . CARDIOVERSION N/A 12/23/2018   Procedure: CARDIOVERSION;  Surgeon: [name], MD;  Location: [facility] ORS;  Service: Cardiovascular;  Laterality: N/A;  . CARDIOVERSION N/A 09/30/2019   Procedure: CARDIOVERSION;  Surgeon: Nelva Bush, MD;  Location: [place] ORS;  Service: Cardiovascular;  Laterality: N/A;  . CESAREAN SECTION     [id number], 1972  . TEE WITHOUT CARDIOVERSION N/A 11/28/2018   Procedure: TRANSESOPHAGEAL ECHOCARDIOGRAM (TEE);  Surgeon: [name], MD;  Location: [facility] ORS;  Service: Cardiovascular;  Laterality: N/A;    Current Meds  Medication Sig  . amiodarone (PACERONE) 200 MG tablet Take 1 tablet (200 mg total) by mouth daily.  . Cholecalciferol (VITAMIN D3) 1000 units CAPS Take 1,000 Units by mouth daily.  Marland Kitchen ELIQUIS 5 MG TABS tablet TAKE 1 TABLET TWICE DAILY (Patient taking differently: Take 5 mg by mouth 2 (two) times daily. )  . escitalopram (LEXAPRO) 10 MG tablet TAKE 1 TABLET (10 MG TOTAL) BY MOUTH DAILY.  Marland Kitchen losartan (COZAAR) 25 MG tablet Take 0.5 tablets (12.5 mg total) by mouth daily.  . Multiple Vitamin (MULTIVITAMIN WITH MINERALS) TABS tablet Take 1 tablet by mouth daily.  . vitamin B-12 (CYANOCOBALAMIN) 1000 MCG tablet Take 1,000 mcg by mouth daily.  . vitamin C (ASCORBIC ACID) 250 MG tablet Take 250 mg by mouth daily.  Allergies  Allergen Reactions  . Codeine Nausea Only and Rash  . Morphine Nausea And Vomiting    Pt allergy may be codeine as codeine or morphine were combined on previous health history. Pt reported upset stomach on questionnaire.  . Sulfa Antibiotics Nausea And Vomiting      Review of Systems negative except from HPI and PMH  Physical Exam BP (!) 144/66 (BP Location: Left Arm, Patient Position: Sitting, Cuff Size: Normal)   Pulse (!) 56   Ht 5' (1.524 m)   Wt 154 lb (69.9 kg)   SpO2 96%   BMI 30.08 kg/m  Well developed and well nourished in  no acute distress HENT normal E scleral and icterus clear Neck Supple JVP flat; carotids brisk and full Clear to ausculation  Regular rate and rhythm, no murmurs gallops or rub Soft with active bowel sounds No clubbing cyanosis  Edema Alert and oriented, grossly normal motor and sensory function Skin Warm and Dry  ECG sinus at 56 Interval 17/08/43  CrCl cannot be calculated (Patient's most recent lab result is older than the maximum 21 days allowed.).   Assessment and  Plan  Atrial fib/flutter  Cardiomyopathy--reversible--rate related  High Risk Medication Surveillance Amiodarone  Sinus bradycardia  Patient has had no recurrent atrial arrhythmias.  Some problems with amiodarone in terms of some sensitivity; also would like not to take medications long-term.  Hence, will ask her to me Dr. Quentin Ore to consider PVI.  Discussed that there are no consensus guidelines as relates to anticoagulation following procedure but sometimes but sometimes loop recorder can be used to detect recurrence of atrial fibrillation and anticoagulation will be held for some time. Procedural outcomes were discussed and there mitigation some degree by obesity.  Her sleep apnea is treated.  Sinus bradycardia it seems is less of a problem than I thought given her lack of limitations for her ADLs.  Perhaps she will find that coming off of amiodarone and improvement in her heart rate that there is a benefit to be had nonetheless  Discussed also strategies related to boundary setting with her mother Current medicines are reviewed at length with the patient today .  The patient does not  have concerns regarding medicines.

## 2020-02-09 ENCOUNTER — Other Ambulatory Visit: Payer: Self-pay | Admitting: Cardiovascular Disease

## 2020-02-10 NOTE — Telephone Encounter (Signed)
Please review for refill. Thanks!  

## 2020-02-10 NOTE — Progress Notes (Signed)
Cardiology Office Note:    Date:  02/11/2020   ID:  ANIYA JOLICOEUR, DOB 06/06/1946, MRN [id number]  PCP:  Crecencio Mc, MD  [facility] Cardiologist:  Nelva Bush, MD  [facility] Electrophysiologist:  None   Referring MD: Crecencio Mc, MD   Chief Complaint  Patient presents with  . office visit    Referred by Dr. Caryl Comes for possible ablation-A Fib; Meds verbally reviewed with patient.  AF  History of Present Illness:    Melissa Bonilla is a 74 y.o. female with a hx of paroxysmal atrial fibrillation on amiodarone who is being seen in clinic for consideration of atrial fibrillation ablation.  She is previously been seen by my partner Dr. Caryl Comes.  Given the patient's desire to not take medications long-term to control her rhythm and due to some problems with amiodarone (itchiness), she is seeking an invasive approach to management of her atrial fibrillation.    Past Medical History:  Diagnosis Date  . Arrhythmia    atrial fibrillation  . CHF (congestive heart failure) ([place])   . Depression   . Hyperlipidemia   . Hypertension   . Menopausal syndrome (hot flashes) 1992   used HRT fo 2 yrs   . Obstructive sleep apnea   . Persistent atrial fibrillation [facility])     Past Surgical History:  Procedure Laterality Date  . APPENDECTOMY  1972  . CARDIOVERSION N/A 11/28/2018   Procedure: CARDIOVERSION;  Surgeon: [name], MD;  Location: [facility] ORS;  Service: Cardiovascular;  Laterality: N/A;  . CARDIOVERSION N/A 12/23/2018   Procedure: CARDIOVERSION;  Surgeon: [name], MD;  Location: [facility] ORS;  Service: Cardiovascular;  Laterality: N/A;  . CARDIOVERSION N/A 09/30/2019   Procedure: CARDIOVERSION;  Surgeon: Nelva Bush, MD;  Location: [place] ORS;  Service: Cardiovascular;  Laterality: N/A;  . CESAREAN SECTION     [id number], 1972  . TEE WITHOUT CARDIOVERSION N/A 11/28/2018   Procedure: TRANSESOPHAGEAL ECHOCARDIOGRAM (TEE);  Surgeon: [name], MD;   Location: [facility] ORS;  Service: Cardiovascular;  Laterality: N/A;    Current Medications: Current Meds  Medication Sig  . amiodarone (PACERONE) 200 MG tablet Take 1 tablet (200 mg total) by mouth daily.  . Cholecalciferol (VITAMIN D3) 1000 units CAPS Take 1,000 Units by mouth daily.  Marland Kitchen ELIQUIS 5 MG TABS tablet TAKE 1 TABLET TWICE DAILY  . escitalopram (LEXAPRO) 10 MG tablet TAKE 1 TABLET (10 MG TOTAL) BY MOUTH DAILY.  Marland Kitchen losartan (COZAAR) 25 MG tablet Take 0.5 tablets (12.5 mg total) by mouth daily.  . Multiple Vitamin (MULTIVITAMIN WITH MINERALS) TABS tablet Take 1 tablet by mouth daily.  . vitamin B-12 (CYANOCOBALAMIN) 1000 MCG tablet Take 1,000 mcg by mouth daily.  . vitamin C (ASCORBIC ACID) 250 MG tablet Take 250 mg by mouth daily.     Allergies:   Codeine, Morphine, and Sulfa antibiotics   Social History   Socioeconomic History  . Marital status: Married    Spouse name: Not on file  . Number of children: Not on file  . Years of education: Not on file  . Highest education level: Not on file  Occupational History    Employer: self    Comment: Owner of a cleaning Business  Tobacco Use  . Smoking status: Former Smoker    Packs/day: 0.25    Years: 15.00    Pack years: 3.75    Types: Cigarettes    Quit date: 1992    Years since quitting: 29.6  .  Smokeless tobacco: Never Used  Vaping Use  . Vaping Use: Never used  Substance and Sexual Activity  . Alcohol use: Not Currently  . Drug use: No  . Sexual activity: Not on file  Other Topics Concern  . Not on file  Social History Narrative  . Not on file   Social Determinants of Health   Financial Resource Strain: Low Risk   . Difficulty of Paying Living Expenses: Not hard at all  Food Insecurity: No Food Insecurity  . Worried About Charity fundraiser in the Last Year: Never true  . Ran Out of Food in the Last Year: Never true  Transportation Needs: No Transportation Needs  . Lack of Transportation (Medical): No  . Lack  of Transportation (Non-Medical): No  Physical Activity:   . Days of Exercise per Week: Not on file  . Minutes of Exercise per Session: Not on file  Stress: No Stress Concern Present  . Feeling of Stress : Not at all  Social Connections:   . Frequency of Communication with Friends and Family: Not on file  . Frequency of Social Gatherings with Friends and Family: Not on file  . Attends Religious Services: Not on file  . Active Member of Clubs or Organizations: Not on file  . Attends Archivist Meetings: Not on file  . Marital Status: Not on file     Family History: The patient's family history includes Arthritis in her maternal grandmother and mother; Breast cancer (age of onset: 10) in her paternal aunt; Breast cancer (age of onset: 46) in her paternal aunt; Heart disease in her mother; Heart disease (age of onset: 54) in her father; Hypertension in her maternal grandmother and mother; Stroke in her maternal grandmother.  ROS:   Please see the history of present illness.     All other systems reviewed and are negative.  EKGs/Labs/Other Studies Reviewed:    The following studies were reviewed today: Echo, ECG  11/26/2019 Echo: 1. Left ventricular ejection fraction, by estimation, is 55 to 60%. The  left ventricle has normal function. The left ventricle has no regional  wall motion abnormalities. Left ventricular diastolic parameters are  consistent with Grade II diastolic  dysfunction (pseudonormalization).  2. Right ventricular systolic function is normal. The right ventricular  size is normal.  3. Left atrial size was severely dilated. 4.1cm. 4. The mitral valve is normal in structure. Mild mitral valve  regurgitation.  5. The aortic valve is tricuspid. Aortic valve regurgitation is not  visualized. Mild aortic valve sclerosis is present, with no evidence of  aortic valve stenosis.  EKG:  EKG is ordered today.  The ekg ordered today demonstrates sinus  rhythm.  Recent Labs: 09/10/2019: Hemoglobin 14.5; Platelets 212 10/30/2019: ALT 16; TSH 2.290 12/19/2019: BUN 14; Creatinine, Ser 1.03; Potassium 4.7; Sodium 137  Recent Lipid Panel    Component Value Date/Time   CHOL 145 11/26/2018 0430   TRIG 54 11/26/2018 0430   HDL 39 (L) 11/26/2018 0430   CHOLHDL 3.7 11/26/2018 0430   VLDL 11 11/26/2018 0430   LDLCALC 95 11/26/2018 0430   LDLDIRECT 175.5 03/19/2013 0930    Physical Exam:    VS:  BP (!) 150/84 (BP Location: Left Arm, Patient Position: Sitting, Cuff Size: Normal)   Pulse 60   Ht 5' (1.524 m)   Wt 153 lb 8 oz (69.6 kg)   SpO2 94%   BMI 29.98 kg/m     Wt Readings from Last 3 Encounters:  02/11/20 153 lb 8 oz (69.6 kg)  02/05/20 154 lb (69.9 kg)  10/30/19 154 lb (69.9 kg)     GEN:  Well nourished, well developed in no acute distress HEENT: Normal NECK: No JVD; No carotid bruits LYMPHATICS: No lymphadenopathy CARDIAC: RRR, no murmurs, rubs, gallops RESPIRATORY:  Clear to auscultation without rales, wheezing or rhonchi  ABDOMEN: Soft, non-tender, non-distended MUSCULOSKELETAL:  No edema; No deformity  SKIN: Warm and dry NEUROLOGIC:  Alert and oriented x 3 PSYCHIATRIC:  Normal affect   ASSESSMENT:    1. Persistent atrial fibrillation ([place])   2. Atrial fibrillation/flutter ([place])    PLAN:    In order of problems listed above:  1. Persistent atrial fibrillation The patient is currently managed with amiodarone and Eliquis twice daily.  Given the patient's desire to not take medications long-term and her intolerance to amiodarone, an invasive approach to the management of her atrial fibrillation is reasonable.  Her left atrial size is 4.1 cm which is in large but not prohibitively so.  We discussed A. fib ablation versus continued antiarrhythmic medications and the patient has elected to proceed with atrial fibrillation ablation.  The approach will be pulmonary vein isolation and posterior wall isolation.   Risk,  benefits, and alternatives to EP study and radiofrequency ablation for afib were also discussed in detail today. These risks include but are not limited to stroke, bleeding, vascular damage, tamponade, perforation, damage to the esophagus, lungs, and other structures, pulmonary vein stenosis, worsening renal function, and death. The patient understands these risk and wishes to proceed.  We will therefore proceed with catheter ablation at the next available time.  Carto, ICE, anesthesia are requested for the procedure.  Will also obtain CT PV protocol prior to the procedure to exclude LAA thrombus and further evaluate atrial anatomy.     Medication Adjustments/Labs and Tests Ordered: Current medicines are reviewed at length with the patient today.  Concerns regarding medicines are outlined above.  Orders Placed This Encounter  Procedures  . EKG 12-Lead   No orders of the defined types were placed in this encounter.   Patient Instructions  Medication Instructions:  Your physician recommends that you continue on your current medications as directed. Please refer to the Current Medication list given to you today.  Labwork: None ordered.  Testing/Procedures: None ordered.  Follow-Up:  The following dates are available in October for an atrial fibrillation ablation:  October 1, 8, 15, 22, 25, 28   Any Other Special Instructions Will Be Listed Below (If Applicable).  If you need a refill on your cardiac medications before your next appointment, please call your pharmacy.    Cardiac Ablation Cardiac ablation is a procedure to disable (ablate) a small amount of heart tissue in very specific places. The heart has many electrical connections. Sometimes these connections are abnormal and can cause the heart to beat very fast or irregularly. Ablating some of the problem areas can improve the heart rhythm or return it to normal. Ablation may be done for people who:  Have  Wolff-Parkinson-White syndrome.  Have fast heart rhythms (tachycardia).  Have taken medicines for an abnormal heart rhythm (arrhythmia) that were not effective or caused side effects.  Have a high-risk heartbeat that may be life-threatening. During the procedure, a small incision is made in the neck or the groin, and a long, thin, flexible tube (catheter) is inserted into the incision and moved to the heart. Small devices (electrodes) on the tip of the catheter will  send out electrical currents. A type of X-ray (fluoroscopy) will be used to help guide the catheter and to provide images of the heart. Tell a health care provider about:  Any allergies you have.  All medicines you are taking, including vitamins, herbs, eye drops, creams, and over-the-counter medicines.  Any problems you or family members have had with anesthetic medicines.  Any blood disorders you have.  Any surgeries you have had.  Any medical conditions you have, such as kidney failure.  Whether you are pregnant or may be pregnant. What are the risks? Generally, this is a safe procedure. However, problems may occur, including:  Infection.  Bruising and bleeding at the catheter insertion site.  Bleeding into the chest, especially into the sac that surrounds the heart. This is a serious complication.  Stroke or blood clots.  Damage to other structures or organs.  Allergic reaction to medicines or dyes.  Need for a permanent pacemaker if the normal electrical system is damaged. A pacemaker is a small computer that sends electrical signals to the heart and helps your heart beat normally.  The procedure not being fully effective. This may not be recognized until months later. Repeat ablation procedures are sometimes required. What happens before the procedure?  Follow instructions from your health care provider about eating or drinking restrictions.  Ask your health care provider about: ? Changing or stopping  your regular medicines. This is especially important if you are taking diabetes medicines or blood thinners. ? Taking medicines such as aspirin and ibuprofen. These medicines can thin your blood. Do not take these medicines before your procedure if your health care provider instructs you not to.  Plan to have someone take you home from the hospital or clinic.  If you will be going home right after the procedure, plan to have someone with you for 24 hours. What happens during the procedure?  To lower your risk of infection: ? Your health care team will wash or sanitize their hands. ? Your skin will be washed with soap. ? Hair may be removed from the incision area.  An IV tube will be inserted into one of your veins.  You will be given a medicine to help you relax (sedative).  The skin on your neck or groin will be numbed.  An incision will be made in your neck or your groin.  A needle will be inserted through the incision and into a large vein in your neck or groin.  A catheter will be inserted into the needle and moved to your heart.  Dye may be injected through the catheter to help your surgeon see the area of the heart that needs treatment.  Electrical currents will be sent from the catheter to ablate heart tissue in desired areas. There are three types of energy that may be used to ablate heart tissue: ? Heat (radiofrequency energy). ? Laser energy. ? Extreme cold (cryoablation).  When the necessary tissue has been ablated, the catheter will be removed.  Pressure will be held on the catheter insertion area to prevent excessive bleeding.  A bandage (dressing) will be placed over the catheter insertion area. The procedure may vary among health care providers and hospitals. What happens after the procedure?  Your blood pressure, heart rate, breathing rate, and blood oxygen level will be monitored until the medicines you were given have worn off.  Your catheter insertion area  will be monitored for bleeding. You will need to lie still for a few hours  to ensure that you do not bleed from the catheter insertion area.  Do not drive for 24 hours or as long as directed by your health care provider. Summary  Cardiac ablation is a procedure to disable (ablate) a small amount of heart tissue in very specific places. Ablating some of the problem areas can improve the heart rhythm or return it to normal.  During the procedure, electrical currents will be sent from the catheter to ablate heart tissue in desired areas. This information is not intended to replace advice given to you by your health care provider. Make sure you discuss any questions you have with your health care provider. Document Revised: 11/26/2017 Document Reviewed: 04/24/2016 Elsevier Patient Education  2020 Abrams, Vickie Epley, MD  02/11/2020 12:58 PM    [facility]   Anticoagulation instructions:  Pt to hold Eliquis for 1 dose(s) prior to procedure and we will plan to resume after the procedure unless otherwise instructed after surgery.  Medication instructions morning of: The patient should hold ALL medications the morning of the procedure   Discharge: Our plan will be to discharge the patient same day after a period of observation

## 2020-02-10 NOTE — Telephone Encounter (Signed)
Eliquis 5mg  refill request received. Patient is 74 years old, weight-69.9kg, Crea-1.03 on 12/19/2019, Diagnosis-Afib, and last seen by Dr. Caryl Comes on 02/05/2020. Dose is appropriate based on dosing criteria. Will send in refill to requested pharmacy.

## 2020-02-11 ENCOUNTER — Encounter: Payer: Self-pay | Admitting: Cardiology

## 2020-02-11 ENCOUNTER — Other Ambulatory Visit: Payer: Self-pay

## 2020-02-11 ENCOUNTER — Telehealth: Payer: Self-pay | Admitting: Cardiology

## 2020-02-11 ENCOUNTER — Ambulatory Visit: Payer: Medicare HMO | Admitting: Cardiology

## 2020-02-11 VITALS — BP 150/84 | HR 60 | Ht 60.0 in | Wt 153.5 lb

## 2020-02-11 DIAGNOSIS — I4819 Other persistent atrial fibrillation: Secondary | ICD-10-CM

## 2020-02-11 DIAGNOSIS — I4892 Unspecified atrial flutter: Secondary | ICD-10-CM

## 2020-02-11 DIAGNOSIS — IMO0002 Reserved for concepts with insufficient information to code with codable children: Secondary | ICD-10-CM

## 2020-02-11 DIAGNOSIS — I4891 Unspecified atrial fibrillation: Secondary | ICD-10-CM | POA: Diagnosis not present

## 2020-02-11 NOTE — Patient Instructions (Addendum)
Medication Instructions:  Your physician recommends that you continue on your current medications as directed. Please refer to the Current Medication list given to you today.  Labwork: None ordered.  Testing/Procedures: None ordered.  Follow-Up:  The following dates are available in October for an atrial fibrillation ablation:  October 1, 8, 15, 22, 25, 28   Any Other Special Instructions Will Be Listed Below (If Applicable).  If you need a refill on your cardiac medications before your next appointment, please call your pharmacy.    Cardiac Ablation Cardiac ablation is a procedure to disable (ablate) a small amount of heart tissue in very specific places. The heart has many electrical connections. Sometimes these connections are abnormal and can cause the heart to beat very fast or irregularly. Ablating some of the problem areas can improve the heart rhythm or return it to normal. Ablation may be done for people who:  Have Wolff-Parkinson-White syndrome.  Have fast heart rhythms (tachycardia).  Have taken medicines for an abnormal heart rhythm (arrhythmia) that were not effective or caused side effects.  Have a high-risk heartbeat that may be life-threatening. During the procedure, a small incision is made in the neck or the groin, and a long, thin, flexible tube (catheter) is inserted into the incision and moved to the heart. Small devices (electrodes) on the tip of the catheter will send out electrical currents. A type of X-ray (fluoroscopy) will be used to help guide the catheter and to provide images of the heart. Tell a health care provider about:  Any allergies you have.  All medicines you are taking, including vitamins, herbs, eye drops, creams, and over-the-counter medicines.  Any problems you or family members have had with anesthetic medicines.  Any blood disorders you have.  Any surgeries you have had.  Any medical conditions you have, such as kidney  failure.  Whether you are pregnant or may be pregnant. What are the risks? Generally, this is a safe procedure. However, problems may occur, including:  Infection.  Bruising and bleeding at the catheter insertion site.  Bleeding into the chest, especially into the sac that surrounds the heart. This is a serious complication.  Stroke or blood clots.  Damage to other structures or organs.  Allergic reaction to medicines or dyes.  Need for a permanent pacemaker if the normal electrical system is damaged. A pacemaker is a small computer that sends electrical signals to the heart and helps your heart beat normally.  The procedure not being fully effective. This may not be recognized until months later. Repeat ablation procedures are sometimes required. What happens before the procedure?  Follow instructions from your health care provider about eating or drinking restrictions.  Ask your health care provider about: ? Changing or stopping your regular medicines. This is especially important if you are taking diabetes medicines or blood thinners. ? Taking medicines such as aspirin and ibuprofen. These medicines can thin your blood. Do not take these medicines before your procedure if your health care provider instructs you not to.  Plan to have someone take you home from the hospital or clinic.  If you will be going home right after the procedure, plan to have someone with you for 24 hours. What happens during the procedure?  To lower your risk of infection: ? Your health care team will wash or sanitize their hands. ? Your skin will be washed with soap. ? Hair may be removed from the incision area.  An IV tube will be inserted into  one of your veins.  You will be given a medicine to help you relax (sedative).  The skin on your neck or groin will be numbed.  An incision will be made in your neck or your groin.  A needle will be inserted through the incision and into a large vein  in your neck or groin.  A catheter will be inserted into the needle and moved to your heart.  Dye may be injected through the catheter to help your surgeon see the area of the heart that needs treatment.  Electrical currents will be sent from the catheter to ablate heart tissue in desired areas. There are three types of energy that may be used to ablate heart tissue: ? Heat (radiofrequency energy). ? Laser energy. ? Extreme cold (cryoablation).  When the necessary tissue has been ablated, the catheter will be removed.  Pressure will be held on the catheter insertion area to prevent excessive bleeding.  A bandage (dressing) will be placed over the catheter insertion area. The procedure may vary among health care providers and hospitals. What happens after the procedure?  Your blood pressure, heart rate, breathing rate, and blood oxygen level will be monitored until the medicines you were given have worn off.  Your catheter insertion area will be monitored for bleeding. You will need to lie still for a few hours to ensure that you do not bleed from the catheter insertion area.  Do not drive for 24 hours or as long as directed by your health care provider. Summary  Cardiac ablation is a procedure to disable (ablate) a small amount of heart tissue in very specific places. Ablating some of the problem areas can improve the heart rhythm or return it to normal.  During the procedure, electrical currents will be sent from the catheter to ablate heart tissue in desired areas. This information is not intended to replace advice given to you by your health care provider. Make sure you discuss any questions you have with your health care provider. Document Revised: 11/26/2017 Document Reviewed: 04/24/2016 Elsevier Patient Education  [place].

## 2020-02-11 NOTE — Telephone Encounter (Signed)
Patient calling in wanting to schedule her ablation procedure. Patient would like to do 10/25  Please call when able to further discuss

## 2020-02-16 NOTE — Telephone Encounter (Signed)
Pt scheduled for afib ablation on 04/09/20  Will get labs/covid test at [facility] on 04/07/20  No cardiac CT needed-had one 12/2019  Work up complete.  Instruction letter mailed.

## 2020-02-27 DIAGNOSIS — G4733 Obstructive sleep apnea (adult) (pediatric): Secondary | ICD-10-CM | POA: Diagnosis not present

## 2020-03-02 NOTE — Progress Notes (Signed)
Patient ID: Melissa Bonilla, female    DOB: 1945/12/04, 74 y.o.   MRN: [id number]  HPI  [name] is a 74 y/o female with a history of atrial fibrillation, hyperlipidemia, obstructive sleep apnea, depression, anxiety and chronic heart failure.   Echo report from 01/29/2019 reviewed and showed an EF of 50-55% with mild to moderate mitral valve regurgitation. Echo report from 11/28/2018 reviewed and showed an EF of 40-45%.  Has not been admitted or been in the ED in the last 6 months.   Patient presents with a chief complaint of a follow-up visit. She currently doesn't have any symptoms and specifically denies any difficulty sleeping, dizziness, abdominal distention, palpitations, pedal edema, chest pain, shortness of breath, cough, fatigue or weight gain.   Scheduled for afib ablation on 04/09/20 with subsequent plans to then be followed in the afib clinic.   Past Medical History:  Diagnosis Date  . Arrhythmia    atrial fibrillation  . CHF (congestive heart failure) ([place])   . Depression   . Hyperlipidemia   . Hypertension   . Menopausal syndrome (hot flashes) 1992   used HRT fo 2 yrs   . Obstructive sleep apnea   . Persistent atrial fibrillation [facility])    Past Surgical History:  Procedure Laterality Date  . APPENDECTOMY  1972  . CARDIOVERSION N/A 11/28/2018   Procedure: CARDIOVERSION;  Surgeon: [name], MD;  Location: [facility] ORS;  Service: Cardiovascular;  Laterality: N/A;  . CARDIOVERSION N/A 12/23/2018   Procedure: CARDIOVERSION;  Surgeon: [name], MD;  Location: [facility] ORS;  Service: Cardiovascular;  Laterality: N/A;  . CARDIOVERSION N/A 09/30/2019   Procedure: CARDIOVERSION;  Surgeon: Nelva Bush, MD;  Location: [place] ORS;  Service: Cardiovascular;  Laterality: N/A;  . CESAREAN SECTION     [id number], 1972  . TEE WITHOUT CARDIOVERSION N/A 11/28/2018   Procedure: TRANSESOPHAGEAL ECHOCARDIOGRAM (TEE);  Surgeon: [name], MD;  Location: [facility] ORS;  Service:  Cardiovascular;  Laterality: N/A;   Family History  Problem Relation Age of Onset  . Arthritis Mother        Rheumatoid  . Heart disease Mother        Valvular  . Hypertension Mother   . Heart disease Father 29       CABG  . Arthritis Maternal Grandmother        Rheumatoid  . Hypertension Maternal Grandmother   . Stroke Maternal Grandmother   . Breast cancer Paternal Aunt 73  . Breast cancer Paternal Aunt 47   Social History   Tobacco Use  . Smoking status: Former Smoker    Packs/day: 0.25    Years: 15.00    Pack years: 3.75    Types: Cigarettes    Quit date: 1992    Years since quitting: 29.7  . Smokeless tobacco: Never Used  Substance Use Topics  . Alcohol use: Not Currently   Allergies  Allergen Reactions  . Codeine Nausea Only and Rash  . Morphine Nausea And Vomiting    Pt allergy may be codeine as codeine or morphine were combined on previous health history. Pt reported upset stomach on questionnaire.  . Sulfa Antibiotics Nausea And Vomiting   Prior to Admission medications   Medication Sig Start Date End Date Taking? Authorizing Provider  amiodarone (PACERONE) 200 MG tablet Take 1 tablet (200 mg total) by mouth daily. 11/20/19  Yes End, [name], MD  Cholecalciferol (VITAMIN D3) 1000 units CAPS Take 1,000 Units by mouth daily.   Yes  [provider]  ELIQUIS 5 MG TABS tablet TAKE 1 TABLET TWICE DAILY 02/10/20  Yes Kathlyn Sacramento A, MD  escitalopram (LEXAPRO) 10 MG tablet TAKE 1 TABLET (10 MG TOTAL) BY MOUTH DAILY. 01/28/20  Yes Crecencio Mc, MD  losartan (COZAAR) 25 MG tablet Take 0.5 tablets (12.5 mg total) by mouth daily. 10/15/19  Yes End, [name], MD  Multiple Vitamin (MULTIVITAMIN WITH MINERALS) TABS tablet Take 1 tablet by mouth daily.   Yes [provider]  vitamin B-12 (CYANOCOBALAMIN) 1000 MCG tablet Take 1,000 mcg by mouth daily.   Yes [provider]  vitamin C (ASCORBIC ACID) 250 MG tablet Take 250 mg by mouth daily.    Yes [provider]    Review of Systems  Constitutional: Negative for appetite change and fatigue.  HENT: Negative for congestion, postnasal drip and sore throat.   Eyes: Negative.   Respiratory: Negative for cough and shortness of breath.   Cardiovascular: Negative for chest pain, palpitations and leg swelling.  Gastrointestinal: Negative for abdominal distention and abdominal pain.  Endocrine: Negative.   Genitourinary: Negative.   Musculoskeletal: Negative for back pain and neck pain.  Skin: Negative.   Allergic/Immunologic: Negative.   Neurological: Negative for dizziness and light-headedness.  Hematological: Negative for adenopathy. Does not bruise/bleed easily.  Psychiatric/Behavioral: Negative for dysphoric mood and sleep disturbance (started CPAP last week). The patient is not nervous/anxious.    Vitals:   03/03/20 1304  BP: (!) 158/60  Pulse: 60  Resp: 16  SpO2: 99%  Weight: 152 lb 8 oz (69.2 kg)  Height: 5' (1.524 m)   Wt Readings from Last 3 Encounters:  03/03/20 152 lb 8 oz (69.2 kg)  02/11/20 153 lb 8 oz (69.6 kg)  02/05/20 154 lb (69.9 kg)   Lab Results  Component Value Date   CREATININE 1.03 (H) 12/19/2019   CREATININE 0.83 10/30/2019   CREATININE 0.88 09/10/2019    Physical Exam Vitals and nursing note reviewed.  Constitutional:      Appearance: Normal appearance.  HENT:     Head: Normocephalic and atraumatic.  Cardiovascular:     Rate and Rhythm: Normal rate and regular rhythm.  Pulmonary:     Effort: Pulmonary effort is normal. No respiratory distress.     Breath sounds: No wheezing or rales.  Abdominal:     Palpations: Abdomen is soft.     Tenderness: There is no abdominal tenderness.  Musculoskeletal:        General: No tenderness.     Cervical back: Normal range of motion and neck supple.     Right lower leg: No edema.     Left lower leg: No edema.  Skin:    General: Skin is warm and dry.  Neurological:     General: No focal  deficit present.     Mental Status: She is alert and oriented to person, place, and time.  Psychiatric:        Mood and Affect: Mood normal.        Behavior: Behavior normal.     Assessment & Plan:  1: Chronic heart failure with mildly reduced ejection fraction- - NYHA class I - euvolemic today - weighing daily; reminded to call for an overnight weight gain of >2 pounds or a weekly weight gain of >5 pounds - weight down 8 pounds from last visit here 6 months ago - not adding salt to her food and tries to closely follow a low sodium diet - has received  first covid vaccine  2: Atrial fibrillation- - saw cardiology Quentin Ore) 02/11/20 - cardioverted April 2021 - ablation scheduled for 04/09/20  3: HTN- - BP mildly elevated today; says her home BP tends to be in the 324'N systolic - saw PCP Derrel Nip) 01/20/2019 via telemedicine - BMP 12/19/19 reviewed and showed sodium 137, potassium 4.7, creatinine 1.03 and GFR 54   Medication list reviewed.  Due to HF stability, will not make a return appointment for patient at this time. Advised patient that she could call back at anytime to schedule another appointment and she was comfortable with this plan.

## 2020-03-03 ENCOUNTER — Encounter: Payer: Self-pay | Admitting: Family

## 2020-03-03 ENCOUNTER — Other Ambulatory Visit: Payer: Self-pay

## 2020-03-03 ENCOUNTER — Ambulatory Visit: Payer: Medicare HMO | Attending: Family | Admitting: Family

## 2020-03-03 VITALS — BP 158/60 | HR 60 | Resp 16 | Ht 60.0 in | Wt 152.5 lb

## 2020-03-03 DIAGNOSIS — I110 Hypertensive heart disease with heart failure: Secondary | ICD-10-CM | POA: Diagnosis not present

## 2020-03-03 DIAGNOSIS — I340 Nonrheumatic mitral (valve) insufficiency: Secondary | ICD-10-CM | POA: Insufficient documentation

## 2020-03-03 DIAGNOSIS — Z7901 Long term (current) use of anticoagulants: Secondary | ICD-10-CM | POA: Diagnosis not present

## 2020-03-03 DIAGNOSIS — F329 Major depressive disorder, single episode, unspecified: Secondary | ICD-10-CM | POA: Diagnosis not present

## 2020-03-03 DIAGNOSIS — Z8249 Family history of ischemic heart disease and other diseases of the circulatory system: Secondary | ICD-10-CM | POA: Diagnosis not present

## 2020-03-03 DIAGNOSIS — I5022 Chronic systolic (congestive) heart failure: Secondary | ICD-10-CM

## 2020-03-03 DIAGNOSIS — F419 Anxiety disorder, unspecified: Secondary | ICD-10-CM | POA: Insufficient documentation

## 2020-03-03 DIAGNOSIS — Z87891 Personal history of nicotine dependence: Secondary | ICD-10-CM | POA: Insufficient documentation

## 2020-03-03 DIAGNOSIS — I4819 Other persistent atrial fibrillation: Secondary | ICD-10-CM | POA: Diagnosis not present

## 2020-03-03 DIAGNOSIS — I509 Heart failure, unspecified: Secondary | ICD-10-CM | POA: Diagnosis not present

## 2020-03-03 DIAGNOSIS — E785 Hyperlipidemia, unspecified: Secondary | ICD-10-CM | POA: Diagnosis not present

## 2020-03-03 DIAGNOSIS — Z79899 Other long term (current) drug therapy: Secondary | ICD-10-CM | POA: Insufficient documentation

## 2020-03-03 DIAGNOSIS — G4733 Obstructive sleep apnea (adult) (pediatric): Secondary | ICD-10-CM | POA: Insufficient documentation

## 2020-03-03 DIAGNOSIS — I10 Essential (primary) hypertension: Secondary | ICD-10-CM

## 2020-03-03 NOTE — Patient Instructions (Addendum)
Continue weighing daily and call for an overnight weight gain of > 2 pounds or a weekly weight gain of >5 pounds.   Call us in the future if you'd like to schedule another appointment 

## 2020-03-12 DIAGNOSIS — G4733 Obstructive sleep apnea (adult) (pediatric): Secondary | ICD-10-CM | POA: Diagnosis not present

## 2020-03-18 DIAGNOSIS — J0100 Acute maxillary sinusitis, unspecified: Secondary | ICD-10-CM | POA: Diagnosis not present

## 2020-03-18 DIAGNOSIS — Z03818 Encounter for observation for suspected exposure to other biological agents ruled out: Secondary | ICD-10-CM | POA: Diagnosis not present

## 2020-03-23 ENCOUNTER — Telehealth: Payer: Self-pay

## 2020-03-23 ENCOUNTER — Other Ambulatory Visit: Payer: Self-pay

## 2020-03-23 DIAGNOSIS — G4733 Obstructive sleep apnea (adult) (pediatric): Secondary | ICD-10-CM | POA: Diagnosis not present

## 2020-03-23 MED ORDER — APIXABAN 5 MG PO TABS: 5.0000 mg | ORAL_TABLET | Freq: Two times a day (BID) | ORAL | 0 refills | Status: DC

## 2020-03-23 NOTE — Telephone Encounter (Signed)
Patient would like Eliquis samples to last 1 week until she gets her mail order rx. States [facility] would not refill until the end of October

## 2020-03-23 NOTE — Telephone Encounter (Signed)
*  STAT* If patient is at the pharmacy, call can be transferred to refill team.   1. Which medications need to be refilled? (please list name of each medication and dose if known) Eliquis  2. Which pharmacy/location (including street and city if local pharmacy) is medication to be sent to? [facility] [address]  3. Do they need a 30 day or 90 day supply? Has an order through mail order, only needs 1 week

## 2020-03-23 NOTE — Telephone Encounter (Signed)
Rx request sent to pharmacy.  

## 2020-03-23 NOTE — Telephone Encounter (Signed)
Pt aware Eliquis 5 mg samples placed upfront for pick up.  1 box Lot# [id number] Ex. 3/23

## 2020-04-04 ENCOUNTER — Other Ambulatory Visit: Payer: Self-pay | Admitting: Physician Assistant

## 2020-04-06 MED ORDER — AMIODARONE HCL 200 MG PO TABS: 200.0000 mg | ORAL_TABLET | Freq: Every day | ORAL | 0 refills | Status: DC

## 2020-04-06 NOTE — Telephone Encounter (Signed)
Patient called in to state that [facility] called patient to tell her this refill was denied   Please advise

## 2020-04-06 NOTE — Addendum Note (Signed)
Addended by: Anselm Pancoast on: 04/06/2020 03:00 PM   Modules accepted: Orders

## 2020-04-06 NOTE — Telephone Encounter (Signed)
Spoke with Donata Duff with [facility] regarding a denial for Amiodarone.  Nah stated, she does see the refill has been approved.

## 2020-04-07 ENCOUNTER — Other Ambulatory Visit: Payer: Self-pay

## 2020-04-07 ENCOUNTER — Other Ambulatory Visit
Admission: RE | Admit: 2020-04-07 | Discharge: 2020-04-07 | Disposition: A | Discharge status: Home / Self Care | Payer: Medicare HMO | Source: Ambulatory Visit | Attending: Ophthalmology | Admitting: Ophthalmology

## 2020-04-07 DIAGNOSIS — Z20822 Contact with and (suspected) exposure to covid-19: Secondary | ICD-10-CM | POA: Diagnosis not present

## 2020-04-07 DIAGNOSIS — Z01812 Encounter for preprocedural laboratory examination: Secondary | ICD-10-CM | POA: Diagnosis not present

## 2020-04-07 LAB — SARS CORONAVIRUS 2 (TAT 6-24 HRS): SARS Coronavirus 2: NEGATIVE

## 2020-04-08 ENCOUNTER — Other Ambulatory Visit
Admission: RE | Admit: 2020-04-08 | Discharge: 2020-04-08 | Disposition: A | Discharge status: Home / Self Care | Payer: Medicare HMO | Attending: Cardiology | Admitting: Cardiology

## 2020-04-08 DIAGNOSIS — I4819 Other persistent atrial fibrillation: Secondary | ICD-10-CM | POA: Diagnosis not present

## 2020-04-08 LAB — CBC WITH DIFFERENTIAL/PLATELET
Abs Immature Granulocytes: 0.02 10*3/uL (ref 0.00–0.07)
Basophils Absolute: 0 10*3/uL (ref 0.0–0.1)
Basophils Relative: 1 %
Eosinophils Absolute: 0.2 10*3/uL (ref 0.0–0.5)
Eosinophils Relative: 4 %
HCT: 41.8 % (ref 36.0–46.0)
Hemoglobin: 14.2 g/dL (ref 12.0–15.0)
Immature Granulocytes: 0 %
Lymphocytes Relative: 31 %
Lymphs Abs: 1.4 10*3/uL (ref 0.7–4.0)
MCH: 31.3 pg (ref 26.0–34.0)
MCHC: 34 g/dL (ref 30.0–36.0)
MCV: 92.3 fL (ref 80.0–100.0)
Monocytes Absolute: 0.8 10*3/uL (ref 0.1–1.0)
Monocytes Relative: 17 %
Neutro Abs: 2.2 10*3/uL (ref 1.7–7.7)
Neutrophils Relative %: 47 %
Platelets: 183 10*3/uL (ref 150–400)
RBC: 4.53 MIL/uL (ref 3.87–5.11)
RDW: 12.5 % (ref 11.5–15.5)
WBC: 4.7 10*3/uL (ref 4.0–10.5)
nRBC: 0 % (ref 0.0–0.2)

## 2020-04-08 LAB — BASIC METABOLIC PANEL
Anion gap: 11 (ref 5–15)
BUN: 22 mg/dL (ref 8–23)
CO2: 25 mmol/L (ref 22–32)
Calcium: 9.2 mg/dL (ref 8.9–10.3)
Chloride: 101 mmol/L (ref 98–111)
Creatinine, Ser: 0.99 mg/dL (ref 0.44–1.00)
GFR, Estimated: 60 mL/min — ABNORMAL LOW (ref >60)
Glucose, Bld: 99 mg/dL (ref 70–99)
Potassium: 4.4 mmol/L (ref 3.5–5.1)
Sodium: 137 mmol/L (ref 135–145)

## 2020-04-08 NOTE — Progress Notes (Signed)
Attempted to call patient regarding procedure instructions.  No answer 

## 2020-04-08 NOTE — Anesthesia Preprocedure Evaluation (Addendum)
Anesthesia Evaluation  Patient identified by MRN, date of birth, ID band Patient awake    Reviewed: Allergy & Precautions, NPO status , Patient's Chart, lab work & pertinent test results  History of Anesthesia Complications Negative for: history of anesthetic complications  Airway Mallampati: II  TM Distance: >3 FB Neck ROM: Full    Dental  (+) Upper Dentures, Partial Lower, Dental Advisory Given   Pulmonary sleep apnea and Continuous Positive Airway Pressure Ventilation , neg COPD, former smoker,    Pulmonary exam normal breath sounds clear to auscultation- rhonchi (-) wheezing      Cardiovascular hypertension, Pt. on medications +CHF (preserved EF)  (-) CAD, (-) Past MI, (-) Cardiac Stents and (-) CABG Normal cardiovascular exam+ dysrhythmias Atrial Fibrillation  - Systolic murmurs and - Diastolic murmurs IMPRESSIONS   1. Left ventricular ejection fraction, by estimation, is 55 to 60%. The left ventricle has normal function. The left ventricle has no regional wall motion abnormalities. Left ventricular diastolic parameters are consistent with Grade II diastolic  dysfunction (pseudonormalization). 2. Right ventricular systolic function is normal. The right ventricular size is normal. 3. Left atrial size was severely dilated. 4. The mitral valve is normal in structure. Mild mitral valve regurgitation. 5. The aortic valve is tricuspid. Aortic valve regurgitation is not visualized. Mild aortic valve sclerosis is present, with no evidence of aortic valve stenosis.   Neuro/Psych neg Seizures PSYCHIATRIC DISORDERS Depression negative neurological ROS     GI/Hepatic negative GI ROS, Neg liver ROS,   Endo/Other  negative endocrine ROSneg diabetes  Renal/GU negative Renal ROS     Musculoskeletal negative musculoskeletal ROS (+)   Abdominal (+) + obese,   Peds  Hematology negative hematology ROS (+)   Anesthesia Other  Findings   Reproductive/Obstetrics                            Anesthesia Physical  Anesthesia Plan  ASA: III  Anesthesia Plan: General   Post-op Pain Management:    Induction: Intravenous  PONV Risk Score and Plan: 3 and Ondansetron and Dexamethasone  Airway Management Planned: Oral ETT  Additional Equipment:   Intra-op Plan:   Post-operative Plan: Extubation in OR  Informed Consent: I have reviewed the patients History and Physical, chart, labs and discussed the procedure including the risks, benefits and alternatives for the proposed anesthesia with the patient or authorized representative who has indicated his/her understanding and acceptance.     Dental advisory given  Plan Discussed with: Anesthesiologist and CRNA  Anesthesia Plan Comments:        Anesthesia Quick Evaluation

## 2020-04-09 ENCOUNTER — Ambulatory Visit (HOSPITAL_COMMUNITY): Payer: Medicare HMO | Admitting: Anesthesiology

## 2020-04-09 ENCOUNTER — Ambulatory Visit: Payer: Medicare HMO

## 2020-04-09 ENCOUNTER — Other Ambulatory Visit: Payer: Self-pay

## 2020-04-09 ENCOUNTER — Ambulatory Visit (HOSPITAL_COMMUNITY)
Admission: RE | Admit: 2020-04-09 | Discharge: 2020-04-09 | Disposition: A | Discharge status: Home / Self Care | Payer: Medicare HMO | Attending: Cardiology | Admitting: Cardiology

## 2020-04-09 ENCOUNTER — Ambulatory Visit (HOSPITAL_COMMUNITY)
Admission: RE | Disposition: A | Discharge status: Home / Self Care | Payer: Self-pay | Source: Home / Self Care | Attending: Cardiology

## 2020-04-09 DIAGNOSIS — I509 Heart failure, unspecified: Secondary | ICD-10-CM | POA: Diagnosis not present

## 2020-04-09 DIAGNOSIS — F329 Major depressive disorder, single episode, unspecified: Secondary | ICD-10-CM | POA: Diagnosis not present

## 2020-04-09 DIAGNOSIS — I4819 Other persistent atrial fibrillation: Secondary | ICD-10-CM | POA: Diagnosis not present

## 2020-04-09 DIAGNOSIS — G4733 Obstructive sleep apnea (adult) (pediatric): Secondary | ICD-10-CM | POA: Diagnosis not present

## 2020-04-09 DIAGNOSIS — Z882 Allergy status to sulfonamides status: Secondary | ICD-10-CM | POA: Insufficient documentation

## 2020-04-09 DIAGNOSIS — I4892 Unspecified atrial flutter: Secondary | ICD-10-CM | POA: Diagnosis not present

## 2020-04-09 DIAGNOSIS — Z885 Allergy status to narcotic agent status: Secondary | ICD-10-CM | POA: Insufficient documentation

## 2020-04-09 DIAGNOSIS — E785 Hyperlipidemia, unspecified: Secondary | ICD-10-CM | POA: Insufficient documentation

## 2020-04-09 DIAGNOSIS — I110 Hypertensive heart disease with heart failure: Secondary | ICD-10-CM | POA: Diagnosis not present

## 2020-04-09 DIAGNOSIS — Z87891 Personal history of nicotine dependence: Secondary | ICD-10-CM | POA: Insufficient documentation

## 2020-04-09 DIAGNOSIS — Z79899 Other long term (current) drug therapy: Secondary | ICD-10-CM | POA: Insufficient documentation

## 2020-04-09 DIAGNOSIS — Z7901 Long term (current) use of anticoagulants: Secondary | ICD-10-CM | POA: Insufficient documentation

## 2020-04-09 HISTORY — PX: ATRIAL FIBRILLATION ABLATION: EP1191

## 2020-04-09 SURGERY — ATRIAL FIBRILLATION ABLATION
Anesthesia: General

## 2020-04-09 MED ORDER — PROPOFOL 10 MG/ML IV BOLUS
INTRAVENOUS | Status: DC | PRN
  Administered 2020-04-09: 110 mg via INTRAVENOUS

## 2020-04-09 MED ORDER — HEPARIN SODIUM (PORCINE) 1000 UNIT/ML IJ SOLN
INTRAMUSCULAR | Status: DC | PRN
  Administered 2020-04-09: 2000 [IU] via INTRAVENOUS
  Administered 2020-04-09: 3000 [IU] via INTRAVENOUS
  Administered 2020-04-09: 11000 [IU] via INTRAVENOUS

## 2020-04-09 MED ORDER — DEXAMETHASONE SODIUM PHOSPHATE 10 MG/ML IJ SOLN
INTRAMUSCULAR | Status: DC | PRN
  Administered 2020-04-09: 4 mg via INTRAVENOUS

## 2020-04-09 MED ORDER — HEPARIN (PORCINE) IN NACL 1000-0.9 UT/500ML-% IV SOLN
INTRAVENOUS | Status: AC
  Filled 2020-04-09: qty 500

## 2020-04-09 MED ORDER — SODIUM CHLORIDE 0.9 % IV SOLN: INTRAVENOUS | Status: DC

## 2020-04-09 MED ORDER — ACETAMINOPHEN 325 MG PO TABS: 650.0000 mg | ORAL_TABLET | ORAL | Status: DC | PRN

## 2020-04-09 MED ORDER — ISOPROTERENOL HCL 0.2 MG/ML IJ SOLN
INTRAVENOUS | Status: DC | PRN
  Administered 2020-04-09: 2 ug/min via INTRAVENOUS

## 2020-04-09 MED ORDER — PANTOPRAZOLE SODIUM 40 MG PO TBEC
40.0000 mg | DELAYED_RELEASE_TABLET | Freq: Every day | ORAL | Status: DC
  Administered 2020-04-09: 40 mg via ORAL
  Filled 2020-04-09: qty 1

## 2020-04-09 MED ORDER — ROCURONIUM BROMIDE 10 MG/ML (PF) SYRINGE
PREFILLED_SYRINGE | INTRAVENOUS | Status: DC | PRN
  Administered 2020-04-09: 90 mg via INTRAVENOUS

## 2020-04-09 MED ORDER — SUGAMMADEX SODIUM 200 MG/2ML IV SOLN
INTRAVENOUS | Status: DC | PRN
  Administered 2020-04-09: 130 mg via INTRAVENOUS

## 2020-04-09 MED ORDER — HEPARIN (PORCINE) IN NACL 1000-0.9 UT/500ML-% IV SOLN
INTRAVENOUS | Status: DC | PRN
  Administered 2020-04-09 (×4): 500 mL

## 2020-04-09 MED ORDER — PROTAMINE SULFATE 10 MG/ML IV SOLN
INTRAVENOUS | Status: DC | PRN
  Administered 2020-04-09 (×2): 15 mg via INTRAVENOUS

## 2020-04-09 MED ORDER — HEPARIN SODIUM (PORCINE) 1000 UNIT/ML IJ SOLN
INTRAMUSCULAR | Status: AC
  Filled 2020-04-09: qty 1

## 2020-04-09 MED ORDER — SODIUM CHLORIDE 0.9% FLUSH: 3.0000 mL | INTRAVENOUS | Status: DC | PRN

## 2020-04-09 MED ORDER — PANTOPRAZOLE SODIUM 40 MG PO TBEC: 40.0000 mg | DELAYED_RELEASE_TABLET | Freq: Every day | ORAL | 0 refills | Status: DC

## 2020-04-09 MED ORDER — ONDANSETRON HCL 4 MG/2ML IJ SOLN: 4.0000 mg | Freq: Four times a day (QID) | INTRAMUSCULAR | Status: DC | PRN

## 2020-04-09 MED ORDER — ACETAMINOPHEN 500 MG PO TABS
ORAL_TABLET | ORAL | Status: AC
  Administered 2020-04-09: 1000 mg via ORAL
  Filled 2020-04-09: qty 2

## 2020-04-09 MED ORDER — EPHEDRINE SULFATE-NACL 50-0.9 MG/10ML-% IV SOSY
PREFILLED_SYRINGE | INTRAVENOUS | Status: DC | PRN
  Administered 2020-04-09: 10 mg via INTRAVENOUS

## 2020-04-09 MED ORDER — PHENYLEPHRINE HCL-NACL 10-0.9 MG/250ML-% IV SOLN
INTRAVENOUS | Status: DC | PRN
  Administered 2020-04-09: 20 ug/min via INTRAVENOUS

## 2020-04-09 MED ORDER — APIXABAN 5 MG PO TABS
5.0000 mg | ORAL_TABLET | Freq: Two times a day (BID) | ORAL | Status: DC
  Administered 2020-04-09: 5 mg via ORAL
  Filled 2020-04-09: qty 1

## 2020-04-09 MED ORDER — HEPARIN SODIUM (PORCINE) 1000 UNIT/ML IJ SOLN
INTRAMUSCULAR | Status: DC | PRN
  Administered 2020-04-09: 1000 [IU] via INTRAVENOUS

## 2020-04-09 MED ORDER — KETOROLAC TROMETHAMINE 30 MG/ML IJ SOLN
60.0000 mg | Freq: Once | INTRAMUSCULAR | Status: AC
  Administered 2020-04-09: 60 mg via INTRAVENOUS
  Filled 2020-04-09: qty 2

## 2020-04-09 MED ORDER — LIDOCAINE 2% (20 MG/ML) 5 ML SYRINGE
INTRAMUSCULAR | Status: DC | PRN
  Administered 2020-04-09: 100 mg via INTRAVENOUS

## 2020-04-09 MED ORDER — LIDOCAINE HCL (PF) 1 % IJ SOLN
INTRAMUSCULAR | Status: AC
  Filled 2020-04-09: qty 30

## 2020-04-09 MED ORDER — ISOPROTERENOL HCL 0.2 MG/ML IJ SOLN
INTRAMUSCULAR | Status: AC
  Filled 2020-04-09: qty 5

## 2020-04-09 MED ORDER — SODIUM CHLORIDE 0.9 % IV SOLN: 250.0000 mL | INTRAVENOUS | Status: DC | PRN

## 2020-04-09 MED ORDER — ONDANSETRON HCL 4 MG/2ML IJ SOLN
INTRAMUSCULAR | Status: DC | PRN
  Administered 2020-04-09: 4 mg via INTRAVENOUS

## 2020-04-09 MED ORDER — FENTANYL CITRATE (PF) 250 MCG/5ML IJ SOLN
INTRAMUSCULAR | Status: DC | PRN
  Administered 2020-04-09: 100 ug via INTRAVENOUS

## 2020-04-09 MED ORDER — ACETAMINOPHEN 500 MG PO TABS: 1000.0000 mg | ORAL_TABLET | Freq: Once | ORAL | Status: AC

## 2020-04-09 MED ORDER — SODIUM CHLORIDE 0.9% FLUSH: 3.0000 mL | Freq: Two times a day (BID) | INTRAVENOUS | Status: DC

## 2020-04-09 SURGICAL SUPPLY — 20 items
BLANKET WARM UNDERBOD FULL ACC (MISCELLANEOUS) ×3 IMPLANT
CATH MAPPNG PENTARAY F 2-6-2MM (CATHETERS) IMPLANT
CATH S CIRCA THERM PROBE 10F (CATHETERS) ×2 IMPLANT
CATH SMTCH THERMOCOOL SF DF (CATHETERS) ×2 IMPLANT
CATH SOUNDSTAR ECO 8FR (CATHETERS) ×2 IMPLANT
CATH WEBSTER BI DIR CS D-F CRV (CATHETERS) ×2 IMPLANT
CLOSURE PERCLOSE PROSTYLE (VASCULAR PRODUCTS) ×6 IMPLANT
COVER DOME SNAP 22 D (MISCELLANEOUS) ×2 IMPLANT
COVER SWIFTLINK CONNECTOR (BAG) ×3 IMPLANT
PACK EP LATEX FREE (CUSTOM PROCEDURE TRAY) ×3
PACK EP LF (CUSTOM PROCEDURE TRAY) ×1 IMPLANT
PAD PRO RADIOLUCENT 2001M-C (PAD) ×3 IMPLANT
PATCH CARTO3 (PAD) ×2 IMPLANT
PENTARAY F 2-6-2MM (CATHETERS) ×3
SHEATH BAYLIS TRANSSEPTAL 98CM (NEEDLE) ×2 IMPLANT
SHEATH CARTO VIZIGO SM CVD (SHEATH) ×2 IMPLANT
SHEATH PINNACLE 8F 10CM (SHEATH) ×4 IMPLANT
SHEATH PINNACLE 9F 10CM (SHEATH) ×2 IMPLANT
SHEATH PROBE COVER 6X72 (BAG) ×2 IMPLANT
TUBING SMART ABLATE COOLFLOW (TUBING) ×4 IMPLANT

## 2020-04-09 NOTE — Anesthesia Procedure Notes (Addendum)
Procedure Name: Intubation Date/Time: 04/09/2020 7:40 AM Performed by: Renato Shin, CRNA Pre-anesthesia Checklist: Patient identified, Emergency Drugs available, Suction available and Patient being monitored Patient Re-evaluated:Patient Re-evaluated prior to induction Oxygen Delivery Method: Circle system utilized Preoxygenation: Pre-oxygenation with 100% oxygen Induction Type: IV induction Ventilation: Mask ventilation without difficulty Laryngoscope Size: Miller and 2 Grade View: Grade I Tube type: Oral Tube size: 7.0 mm Number of attempts: 1 Airway Equipment and Method: Stylet and Oral airway Placement Confirmation: ETT inserted through vocal cords under direct vision,  positive ETCO2 and breath sounds checked- equal and bilateral Secured at: 21 cm Tube secured with: Tape Dental Injury: Teeth and Oropharynx as per pre-operative assessment

## 2020-04-09 NOTE — Discharge Instructions (Signed)

## 2020-04-09 NOTE — H&P (Signed)
Cardiology Office Note:    Date:  04/09/2020   ID:  Melissa Bonilla, DOB 08/06/1945, MRN [id number]  PCP:  Crecencio Mc, MD  [facility] Cardiologist:  Nelva Bush, MD  [facility] Electrophysiologist:  None   Referring MD: Crecencio Mc, MD       Chief Complaint  Patient presents with  . office visit    Referred by Dr. Caryl Comes for possible ablation-A Fib; Meds verbally reviewed with patient.  AF  History of Present Illness:    Melissa Bonilla is a 74 y.o. female with a hx of paroxysmal atrial fibrillation on amiodarone who is being seen in clinic for consideration of atrial fibrillation ablation.  She is previously been seen by my partner Dr. Caryl Comes.  Given the patient's desire to not take medications long-term to control her rhythm and due to some problems with amiodarone (itchiness), she is seeking an invasive approach to management of her atrial fibrillation.        Past Medical History:  Diagnosis Date  . Arrhythmia    atrial fibrillation  . CHF (congestive heart failure) ([place])   . Depression   . Hyperlipidemia   . Hypertension   . Menopausal syndrome (hot flashes) 1992   used HRT fo 2 yrs   . Obstructive sleep apnea   . Persistent atrial fibrillation [facility])          Past Surgical History:  Procedure Laterality Date  . APPENDECTOMY  1972  . CARDIOVERSION N/A 11/28/2018   Procedure: CARDIOVERSION;  Surgeon: [name], MD;  Location: [facility] ORS;  Service: Cardiovascular;  Laterality: N/A;  . CARDIOVERSION N/A 12/23/2018   Procedure: CARDIOVERSION;  Surgeon: [name], MD;  Location: [facility] ORS;  Service: Cardiovascular;  Laterality: N/A;  . CARDIOVERSION N/A 09/30/2019   Procedure: CARDIOVERSION;  Surgeon: Nelva Bush, MD;  Location: [place] [facility];  Service: Cardiovascular;  Laterality: N/A;  . CESAREAN SECTION     [id number], 1972  . TEE WITHOUT CARDIOVERSION N/A 11/28/2018   Procedure: TRANSESOPHAGEAL  ECHOCARDIOGRAM (TEE);  Surgeon: [name], MD;  Location: [facility] ORS;  Service: Cardiovascular;  Laterality: N/A;    Current Medications: Active Medications      Current Meds  Medication Sig  . amiodarone (PACERONE) 200 MG tablet Take 1 tablet (200 mg total) by mouth daily.  . Cholecalciferol (VITAMIN D3) 1000 units CAPS Take 1,000 Units by mouth daily.  Marland Kitchen ELIQUIS 5 MG TABS tablet TAKE 1 TABLET TWICE DAILY  . escitalopram (LEXAPRO) 10 MG tablet TAKE 1 TABLET (10 MG TOTAL) BY MOUTH DAILY.  Marland Kitchen losartan (COZAAR) 25 MG tablet Take 0.5 tablets (12.5 mg total) by mouth daily.  . Multiple Vitamin (MULTIVITAMIN WITH MINERALS) TABS tablet Take 1 tablet by mouth daily.  . vitamin B-12 (CYANOCOBALAMIN) 1000 MCG tablet Take 1,000 mcg by mouth daily.  . vitamin C (ASCORBIC ACID) 250 MG tablet Take 250 mg by mouth daily.       Allergies:   Codeine, Morphine, and Sulfa antibiotics   Social History        Socioeconomic History  . Marital status: Married    Spouse name: Not on file  . Number of children: Not on file  . Years of education: Not on file  . Highest education level: Not on file  Occupational History    Employer: self    Comment: Owner of a cleaning Business  Tobacco Use  . Smoking status: Former Smoker    Packs/day: 0.25  Years: 15.00    Pack years: 3.75    Types: Cigarettes    Quit date: 59    Years since quitting: 29.6  . Smokeless tobacco: Never Used  Vaping Use  . Vaping Use: Never used  Substance and Sexual Activity  . Alcohol use: Not Currently  . Drug use: No  . Sexual activity: Not on file  Other Topics Concern  . Not on file  Social History Narrative  . Not on file   Social Determinants of Health      Financial Resource Strain: Low Risk   . Difficulty of Paying Living Expenses: Not hard at all  Food Insecurity: No Food Insecurity  . Worried About Charity fundraiser in the Last Year: Never true  . Ran Out of Food in the  Last Year: Never true  Transportation Needs: No Transportation Needs  . Lack of Transportation (Medical): No  . Lack of Transportation (Non-Medical): No  Physical Activity:   . Days of Exercise per Week: Not on file  . Minutes of Exercise per Session: Not on file  Stress: No Stress Concern Present  . Feeling of Stress : Not at all  Social Connections:   . Frequency of Communication with Friends and Family: Not on file  . Frequency of Social Gatherings with Friends and Family: Not on file  . Attends Religious Services: Not on file  . Active Member of Clubs or Organizations: Not on file  . Attends Archivist Meetings: Not on file  . Marital Status: Not on file     Family History: The patient's family history includes Arthritis in her maternal grandmother and mother; Breast cancer (age of onset: 1) in her paternal aunt; Breast cancer (age of onset: 21) in her paternal aunt; Heart disease in her mother; Heart disease (age of onset: 68) in her father; Hypertension in her maternal grandmother and mother; Stroke in her maternal grandmother.  ROS:   Please see the history of present illness.     All other systems reviewed and are negative.  EKGs/Labs/Other Studies Reviewed:    The following studies were reviewed today: Echo, ECG  11/26/2019 Echo: 1. Left ventricular ejection fraction, by estimation, is 55 to 60%. The  left ventricle has normal function. The left ventricle has no regional  wall motion abnormalities. Left ventricular diastolic parameters are  consistent with Grade II diastolic  dysfunction (pseudonormalization).  2. Right ventricular systolic function is normal. The right ventricular  size is normal.  3. Left atrial size was severely dilated. 4.1cm. 4. The mitral valve is normal in structure. Mild mitral valve  regurgitation.  5. The aortic valve is tricuspid. Aortic valve regurgitation is not  visualized. Mild aortic valve sclerosis is present, with  no evidence of  aortic valve stenosis.  EKG:  EKG is ordered today.  The ekg ordered today demonstrates sinus rhythm.  Recent Labs: 09/10/2019: Hemoglobin 14.5; Platelets 212 10/30/2019: ALT 16; TSH 2.290 12/19/2019: BUN 14; Creatinine, Ser 1.03; Potassium 4.7; Sodium 137  Recent Lipid Panel Labs (Brief)          Component Value Date/Time   CHOL 145 11/26/2018 0430   TRIG 54 11/26/2018 0430   HDL 39 (L) 11/26/2018 0430   CHOLHDL 3.7 11/26/2018 0430   VLDL 11 11/26/2018 0430   LDLCALC 95 11/26/2018 0430   LDLDIRECT 175.5 03/19/2013 0930      Physical Exam:    VS:  BP (!) 150/84 (BP Location: Left Arm, Patient Position: Sitting,  Cuff Size: Normal)   Pulse 60   Ht 5' (1.524 m)   Wt 153 lb 8 oz (69.6 kg)   SpO2 94%   BMI 29.98 kg/m        Wt Readings from Last 3 Encounters:  02/11/20 153 lb 8 oz (69.6 kg)  02/05/20 154 lb (69.9 kg)  10/30/19 154 lb (69.9 kg)     GEN:  Well nourished, well developed in no acute distress HEENT: Normal NECK: No JVD; No carotid bruits LYMPHATICS: No lymphadenopathy CARDIAC: RRR, no murmurs, rubs, gallops RESPIRATORY:  Clear to auscultation without rales, wheezing or rhonchi  ABDOMEN: Soft, non-tender, non-distended MUSCULOSKELETAL:  No edema; No deformity  SKIN: Warm and dry NEUROLOGIC:  Alert and oriented x 3 PSYCHIATRIC:  Normal affect   ASSESSMENT:    1. Persistent atrial fibrillation ([place])   2. Atrial fibrillation/flutter ([place])    PLAN:    In order of problems listed above:  1. Persistent atrial fibrillation The patient is currently managed with amiodarone and Eliquis twice daily.  Given the patient's desire to not take medications long-term and her intolerance to amiodarone, an invasive approach to the management of her atrial fibrillation is reasonable.  Her left atrial size is 4.1 cm which is in large but not prohibitively so.  We discussed A. fib ablation versus continued antiarrhythmic medications and the  patient has elected to proceed with atrial fibrillation ablation.  The approach will be pulmonary vein isolation and posterior wall isolation.   Risk, benefits, and alternatives to EP study and radiofrequency ablation for afib were also discussed in detail today. These risks include but are not limited to stroke, bleeding, vascular damage, tamponade, perforation, damage to the esophagus, lungs, and other structures, pulmonary vein stenosis, worsening renal function, and death. The patient understands these risk and wishes to proceed.  We will therefore proceed with catheter ablation at the next available time.  Carto, ICE, anesthesia are requested for the procedure.  Will also obtain CT PV protocol prior to the procedure to exclude LAA thrombus and further evaluate atrial anatomy.   -------------------------------------------------------------------------- I have seen, examined the patient, and reviewed the above assessment and plan.  Plan for PVI today.   Lysbeth Galas T. Quentin Ore, MD, [facility] Cardiac Electrophysiology

## 2020-04-09 NOTE — Transfer of Care (Signed)
Immediate Anesthesia Transfer of Care Note  Patient: Melissa Bonilla  Procedure(s) Performed: ATRIAL FIBRILLATION ABLATION (N/A )  Patient Location: PACU  Anesthesia Type:General  Level of Consciousness: awake and patient cooperative  Airway & Oxygen Therapy: Patient Spontanous Breathing and Patient connected to nasal cannula oxygen  Post-op Assessment: Report given to RN and Post -op Vital signs reviewed and stable  Post vital signs: Reviewed and stable  Last Vitals:  Vitals Value Taken Time  BP 148/59 04/09/20 1058  Temp    Pulse 77 04/09/20 1100  Resp 26 04/09/20 1100  SpO2 93 % 04/09/20 1100  Vitals shown include unvalidated device data.  Last Pain:  Vitals:   04/09/20 0557  TempSrc:   PainSc: 0-No pain         Complications: No complications documented.

## 2020-04-10 NOTE — Anesthesia Postprocedure Evaluation (Signed)
Anesthesia Post Note  Patient: Melissa Bonilla  Procedure(s) Performed: ATRIAL FIBRILLATION ABLATION (N/A )     Patient location during evaluation: Cath Lab Anesthesia Type: General Level of consciousness: sedated Pain management: pain level controlled Vital Signs Assessment: post-procedure vital signs reviewed and stable Respiratory status: spontaneous breathing and respiratory function stable Cardiovascular status: stable Postop Assessment: no apparent nausea or vomiting Anesthetic complications: no   No complications documented.  Last Vitals:  Vitals:   04/09/20 1400 04/09/20 1500  BP: (!) 124/59 (!) 112/54  Pulse: (!) 46 67  Resp: 17 17  Temp:    SpO2: (!) 89% 94%    Last Pain:  Vitals:   04/09/20 1230  TempSrc:   PainSc: Asleep                 [name] DANIEL

## 2020-04-12 ENCOUNTER — Encounter (HOSPITAL_COMMUNITY): Payer: Self-pay | Admitting: Cardiology

## 2020-04-12 ENCOUNTER — Telehealth: Payer: Self-pay

## 2020-04-12 LAB — POCT ACTIVATED CLOTTING TIME
Activated Clotting Time: 312 seconds
Activated Clotting Time: 312 seconds
Activated Clotting Time: 340 seconds
Activated Clotting Time: 373 seconds

## 2020-04-12 MED FILL — Lidocaine HCl Local Preservative Free (PF) Inj 1%: INTRAMUSCULAR | Qty: 30 | Status: AC

## 2020-04-12 NOTE — Telephone Encounter (Signed)
Patient reports she has hospital follow up scheduled with Cardiology and plans to schedule with pcp at a later date. Agrees to contact office if anything changes and as needed.

## 2020-04-14 ENCOUNTER — Telehealth: Payer: Self-pay | Admitting: Cardiology

## 2020-04-14 NOTE — Telephone Encounter (Signed)
Returned call to pt.  Advised her first follow up was in November with afib clinic.  Her 3 mo f/u with CL has not been scheduled because [place] schedule is not available yet.  Hopefully she can schedule at afib clinic visit.  Pt also had question regarding her medications.  Advised she should continue her current medications and will reassess at 3 mo follow up.

## 2020-04-14 NOTE — Telephone Encounter (Signed)
Patient had recent ablation 10-22 and is not sure when to do fu   Please advise

## 2020-04-19 DIAGNOSIS — H524 Presbyopia: Secondary | ICD-10-CM | POA: Diagnosis not present

## 2020-04-19 DIAGNOSIS — Z135 Encounter for screening for eye and ear disorders: Secondary | ICD-10-CM | POA: Diagnosis not present

## 2020-04-19 DIAGNOSIS — H2513 Age-related nuclear cataract, bilateral: Secondary | ICD-10-CM | POA: Diagnosis not present

## 2020-04-19 DIAGNOSIS — Z0100 Encounter for examination of eyes and vision without abnormal findings: Secondary | ICD-10-CM | POA: Diagnosis not present

## 2020-04-21 ENCOUNTER — Ambulatory Visit: Payer: Medicare HMO

## 2020-05-10 ENCOUNTER — Ambulatory Visit (HOSPITAL_COMMUNITY)
Admission: RE | Admit: 2020-05-10 | Discharge: 2020-05-10 | Disposition: A | Discharge status: Home / Self Care | Payer: Medicare HMO | Source: Ambulatory Visit | Attending: Physician Assistant | Admitting: Physician Assistant

## 2020-05-10 ENCOUNTER — Other Ambulatory Visit: Payer: Self-pay

## 2020-05-10 ENCOUNTER — Encounter (HOSPITAL_COMMUNITY): Payer: Self-pay | Admitting: Physician Assistant

## 2020-05-10 VITALS — BP 160/70 | HR 56 | Ht 60.0 in | Wt 156.0 lb

## 2020-05-10 DIAGNOSIS — Z79899 Other long term (current) drug therapy: Secondary | ICD-10-CM | POA: Insufficient documentation

## 2020-05-10 DIAGNOSIS — I509 Heart failure, unspecified: Secondary | ICD-10-CM | POA: Diagnosis not present

## 2020-05-10 DIAGNOSIS — I428 Other cardiomyopathies: Secondary | ICD-10-CM | POA: Diagnosis not present

## 2020-05-10 DIAGNOSIS — I110 Hypertensive heart disease with heart failure: Secondary | ICD-10-CM | POA: Diagnosis not present

## 2020-05-10 DIAGNOSIS — Z87891 Personal history of nicotine dependence: Secondary | ICD-10-CM | POA: Diagnosis not present

## 2020-05-10 DIAGNOSIS — Z7901 Long term (current) use of anticoagulants: Secondary | ICD-10-CM | POA: Diagnosis not present

## 2020-05-10 DIAGNOSIS — I4819 Other persistent atrial fibrillation: Secondary | ICD-10-CM | POA: Diagnosis not present

## 2020-05-10 DIAGNOSIS — Z6830 Body mass index (BMI) 30.0-30.9, adult: Secondary | ICD-10-CM | POA: Insufficient documentation

## 2020-05-10 DIAGNOSIS — E669 Obesity, unspecified: Secondary | ICD-10-CM | POA: Diagnosis not present

## 2020-05-10 DIAGNOSIS — D6869 Other thrombophilia: Secondary | ICD-10-CM | POA: Diagnosis not present

## 2020-05-10 DIAGNOSIS — Z9989 Dependence on other enabling machines and devices: Secondary | ICD-10-CM | POA: Diagnosis not present

## 2020-05-10 DIAGNOSIS — G4733 Obstructive sleep apnea (adult) (pediatric): Secondary | ICD-10-CM | POA: Diagnosis not present

## 2020-05-10 DIAGNOSIS — E785 Hyperlipidemia, unspecified: Secondary | ICD-10-CM | POA: Diagnosis not present

## 2020-05-10 LAB — COMPREHENSIVE METABOLIC PANEL
ALT: 33 U/L (ref 0–44)
AST: 32 U/L (ref 15–41)
Albumin: 3.9 g/dL (ref 3.5–5.0)
Alkaline Phosphatase: 76 U/L (ref 38–126)
Anion gap: 8 (ref 5–15)
BUN: 16 mg/dL (ref 8–23)
CO2: 26 mmol/L (ref 22–32)
Calcium: 9.2 mg/dL (ref 8.9–10.3)
Chloride: 102 mmol/L (ref 98–111)
Creatinine, Ser: 0.89 mg/dL (ref 0.44–1.00)
GFR, Estimated: >60 mL/min (ref >60)
Glucose, Bld: 94 mg/dL (ref 70–99)
Potassium: 5.3 mmol/L — ABNORMAL HIGH (ref 3.5–5.1)
Sodium: 136 mmol/L (ref 135–145)
Total Bilirubin: 0.8 mg/dL (ref 0.3–1.2)
Total Protein: 6.5 g/dL (ref 6.5–8.1)

## 2020-05-10 LAB — TSH: TSH: 2.809 u[IU]/mL (ref 0.350–4.500)

## 2020-05-10 NOTE — Progress Notes (Signed)
Primary Care Physician: Crecencio Mc, MD Primary Cardiologist: Dr End Primary Electrophysiologist: Dr Caryl Comes Referring Physician: Dr Allie Bossier Melissa Bonilla is a 74 y.o. female with a history of chronic systolic CHF, atrial flutter, persistent atrial fibrillation, HTN, HLD, OSA who presents for follow up in the [facility].  The patient was initially diagnosed with atrial fibrillation 11/2018. She has been maintained on amiodarone. Patient is on Eliquis for a CHADS2VASC score of 4. Patient underwent afib ablation with Dr Quentin Ore on 04/09/20. She reports that she is feeling "better now than ever." She is very pleased with her ablation. She denies any heart racing or palpitations. She denies CP, swallowing, or groin issues.   Today, she denies symptoms of palpitations, chest pain, shortness of breath, orthopnea, PND, lower extremity edema, dizziness, presyncope, syncope, bleeding, or neurologic sequela. The patient is tolerating medications without difficulties and is otherwise without complaint today.    Atrial Fibrillation Risk Factors:  she does have symptoms or diagnosis of sleep apnea. she is compliant with CPAP therapy. she does not have a history of rheumatic fever.   she has a BMI of Body mass index is 30.47 kg/m.Marland Kitchen Filed Weights   05/10/20 1329  Weight: 70.8 kg    Family History  Problem Relation Age of Onset  . Arthritis Mother        Rheumatoid  . Heart disease Mother        Valvular  . Hypertension Mother   . Heart disease Father 78       CABG  . Arthritis Maternal Grandmother        Rheumatoid  . Hypertension Maternal Grandmother   . Stroke Maternal Grandmother   . Breast cancer Paternal Aunt 32  . Breast cancer Paternal Aunt 60     Atrial Fibrillation Management history:  Previous antiarrhythmic drugs: amiodarone Previous cardioversions: 11/2018, 12/2018, 09/2019 Previous ablations: 04/09/20 CHADS2VASC score:  4 Anticoagulation history: Eliquis   Past Medical History:  Diagnosis Date  . Arrhythmia    atrial fibrillation  . CHF (congestive heart failure) ([place])   . Depression   . Hyperlipidemia   . Hypertension   . Menopausal syndrome (hot flashes) 1992   used HRT fo 2 yrs   . Obstructive sleep apnea   . Persistent atrial fibrillation [facility])    Past Surgical History:  Procedure Laterality Date  . APPENDECTOMY  1972  . ATRIAL FIBRILLATION ABLATION N/A 04/09/2020   Procedure: ATRIAL FIBRILLATION ABLATION;  Surgeon: Vickie Epley, MD;  Location: [place] [facility];  Service: Cardiovascular;  Laterality: N/A;  . CARDIOVERSION N/A 11/28/2018   Procedure: CARDIOVERSION;  Surgeon: [name], MD;  Location: [facility];  Service: Cardiovascular;  Laterality: N/A;  . CARDIOVERSION N/A 12/23/2018   Procedure: CARDIOVERSION;  Surgeon: [name], MD;  Location: [facility];  Service: Cardiovascular;  Laterality: N/A;  . CARDIOVERSION N/A 09/30/2019   Procedure: CARDIOVERSION;  Surgeon: Nelva Bush, MD;  Location: [place] [facility];  Service: Cardiovascular;  Laterality: N/A;  . CESAREAN SECTION     [id number], 1972  . TEE WITHOUT CARDIOVERSION N/A 11/28/2018   Procedure: TRANSESOPHAGEAL ECHOCARDIOGRAM (TEE);  Surgeon: [name], MD;  Location: [facility];  Service: Cardiovascular;  Laterality: N/A;    Current Outpatient Medications  Medication Sig Dispense Refill  . amiodarone (PACERONE) 200 MG tablet Take 1 tablet (200 mg total) by mouth daily. 90 tablet 0  . apixaban (ELIQUIS) 5 MG TABS tablet Take 1  tablet (5 mg total) by mouth 2 (two) times daily. 14 tablet 0  . ascorbic acid (VITAMIN C) 500 MG tablet Take 500 mg by mouth daily.    . cholecalciferol (VITAMIN D) 25 MCG (1000 UNIT) tablet Take 1,000 Units by mouth daily.    Marland Kitchen escitalopram (LEXAPRO) 10 MG tablet TAKE 1 TABLET (10 MG TOTAL) BY MOUTH DAILY. 90 tablet 1  . losartan (COZAAR) 25 MG tablet Take 0.5 tablets (12.5 mg total)  by mouth daily. 45 tablet 1  . Multiple Vitamin (MULTIVITAMIN WITH MINERALS) TABS tablet Take 1 tablet by mouth daily.    . pantoprazole (PROTONIX) 40 MG tablet Take 1 tablet (40 mg total) by mouth daily. 90 tablet 0  . vitamin E 180 MG (400 UNITS) capsule Take 400 Units by mouth daily.     No current facility-administered medications for this encounter.    Allergies  Allergen Reactions  . Codeine Nausea Only and Rash  . Morphine Nausea And Vomiting    Pt allergy may be codeine as codeine or morphine were combined on previous health history. Pt reported upset stomach on questionnaire.  . Sulfa Antibiotics Nausea And Vomiting    Social History   Socioeconomic History  . Marital status: Married    Spouse name: Not on file  . Number of children: Not on file  . Years of education: Not on file  . Highest education level: Not on file  Occupational History    Employer: self    Comment: Owner of a cleaning Business  Tobacco Use  . Smoking status: Former Smoker    Packs/day: 0.25    Years: 15.00    Pack years: 3.75    Types: Cigarettes    Quit date: 1992    Years since quitting: 29.9  . Smokeless tobacco: Never Used  Vaping Use  . Vaping Use: Never used  Substance and Sexual Activity  . Alcohol use: Yes    Alcohol/week: 1.0 standard drink    Types: 1 Glasses of wine per week  . Drug use: No  . Sexual activity: Not on file  Other Topics Concern  . Not on file  Social History Narrative  . Not on file   Social Determinants of Health   Financial Resource Strain:   . Difficulty of Paying Living Expenses: Not on file  Food Insecurity:   . Worried About Charity fundraiser in the Last Year: Not on file  . Ran Out of Food in the Last Year: Not on file  Transportation Needs:   . Lack of Transportation (Medical): Not on file  . Lack of Transportation (Non-Medical): Not on file  Physical Activity:   . Days of Exercise per Week: Not on file  . Minutes of Exercise per Session:  Not on file  Stress:   . Feeling of Stress : Not on file  Social Connections:   . Frequency of Communication with Friends and Family: Not on file  . Frequency of Social Gatherings with Friends and Family: Not on file  . Attends Religious Services: Not on file  . Active Member of Clubs or Organizations: Not on file  . Attends Archivist Meetings: Not on file  . Marital Status: Not on file  Intimate Partner Violence:   . Fear of Current or Ex-Partner: Not on file  . Emotionally Abused: Not on file  . Physically Abused: Not on file  . Sexually Abused: Not on file     ROS- All systems are  reviewed and negative except as per the HPI above.  Physical Exam: Vitals:   05/10/20 1329  BP: (!) 160/70  Pulse: (!) 56  Weight: 70.8 kg  Height: 5' (1.524 m)    GEN- The patient is well appearing obese elderly female, alert and oriented x 3 today.   Head- normocephalic, atraumatic Eyes-  Sclera clear, conjunctiva pink Ears- hearing intact Oropharynx- clear Neck- supple  Lungs- Clear to ausculation bilaterally, normal work of breathing Heart- Regular rate and rhythm, no murmurs, rubs or gallops  GI- soft, NT, ND, + BS Extremities- no clubbing, cyanosis, or edema MS- no significant deformity or atrophy Skin- no rash or lesion Psych- euthymic mood, full affect Neuro- strength and sensation are intact  Wt Readings from Last 3 Encounters:  05/10/20 70.8 kg  04/09/20 69.4 kg  03/03/20 69.2 kg    EKG today demonstrates SB HR 56, NST, PR 178, QRS 92, QTc 426  Echo 11/26/19 demonstrated  1. Left ventricular ejection fraction, by estimation, is 55 to 60%. The  left ventricle has normal function. The left ventricle has no regional  wall motion abnormalities. Left ventricular diastolic parameters are  consistent with Grade II diastolic  dysfunction (pseudonormalization).  2. Right ventricular systolic function is normal. The right ventricular  size is normal.  3. Left atrial  size was severely dilated.  4. The mitral valve is normal in structure. Mild mitral valve  regurgitation.  5. The aortic valve is tricuspid. Aortic valve regurgitation is not  visualized. Mild aortic valve sclerosis is present, with no evidence of  aortic valve stenosis.   Epic records are reviewed at length today  CHA2DS2-VASc Score = 4  The patient's score is based upon: CHF History: 1 HTN History: 1 Diabetes History: 0 Stroke History: 0 Vascular Disease History: 0 Age Score: 1 Gender Score: 1      ASSESSMENT AND PLAN: 1. Persistent Atrial Fibrillation (ICD10:  I48.19) The patient's CHA2DS2-VASc score is 4, indicating a 4.8% annual risk of stroke.   S/p afib ablation with Dr Quentin Ore 04/09/20 Patient appears to be maintaining SR. Continue amiodarone 200 mg daily for now. Continue Eliquis 5 mg BID with no missed doses for at least 3 months post ablation.   2. Secondary Hypercoagulable State (ICD10:  D68.69) The patient is at significant risk for stroke/thromboembolism based upon her CHA2DS2-VASc Score of 4.  Continue Apixaban (Eliquis).   3. Obesity Body mass index is 30.47 kg/m. Lifestyle modification was discussed at length including regular exercise and weight reduction.  4. Obstructive sleep apnea The importance of adequate treatment of sleep apnea was discussed today in order to improve our ability to maintain sinus rhythm long term. Patient reports compliance with CPAP therapy.  5. NICM Suspected tachycardia mediated. No signs or symptoms of fluid overload.  6. HTN Stable, no changes today.   Follow up with Dr Quentin Ore per recall.    [facility] [address] [place], [place] [address] [phone] 05/10/2020 1:38 PM

## 2020-05-26 ENCOUNTER — Ambulatory Visit (HOSPITAL_COMMUNITY)
Admission: RE | Admit: 2020-05-26 | Discharge: 2020-05-26 | Disposition: A | Discharge status: Home / Self Care | Payer: Medicare HMO | Source: Ambulatory Visit | Attending: Physician Assistant | Admitting: Physician Assistant

## 2020-05-26 ENCOUNTER — Other Ambulatory Visit: Payer: Self-pay

## 2020-05-26 DIAGNOSIS — I4819 Other persistent atrial fibrillation: Secondary | ICD-10-CM | POA: Diagnosis not present

## 2020-05-26 LAB — BASIC METABOLIC PANEL
Anion gap: 7 (ref 5–15)
BUN: 20 mg/dL (ref 8–23)
CO2: 27 mmol/L (ref 22–32)
Calcium: 9.2 mg/dL (ref 8.9–10.3)
Chloride: 103 mmol/L (ref 98–111)
Creatinine, Ser: 0.85 mg/dL (ref 0.44–1.00)
GFR, Estimated: >60 mL/min (ref >60)
Glucose, Bld: 95 mg/dL (ref 70–99)
Potassium: 5.3 mmol/L — ABNORMAL HIGH (ref 3.5–5.1)
Sodium: 137 mmol/L (ref 135–145)

## 2020-06-01 DIAGNOSIS — C44629 Squamous cell carcinoma of skin of left upper limb, including shoulder: Secondary | ICD-10-CM | POA: Diagnosis not present

## 2020-06-01 DIAGNOSIS — D2261 Melanocytic nevi of right upper limb, including shoulder: Secondary | ICD-10-CM | POA: Diagnosis not present

## 2020-06-01 DIAGNOSIS — Z85828 Personal history of other malignant neoplasm of skin: Secondary | ICD-10-CM | POA: Diagnosis not present

## 2020-06-01 DIAGNOSIS — D2262 Melanocytic nevi of left upper limb, including shoulder: Secondary | ICD-10-CM | POA: Diagnosis not present

## 2020-06-01 DIAGNOSIS — B001 Herpesviral vesicular dermatitis: Secondary | ICD-10-CM | POA: Diagnosis not present

## 2020-06-01 DIAGNOSIS — D225 Melanocytic nevi of trunk: Secondary | ICD-10-CM | POA: Diagnosis not present

## 2020-06-01 DIAGNOSIS — D485 Neoplasm of uncertain behavior of skin: Secondary | ICD-10-CM | POA: Diagnosis not present

## 2020-06-14 DIAGNOSIS — G4733 Obstructive sleep apnea (adult) (pediatric): Secondary | ICD-10-CM | POA: Diagnosis not present

## 2020-06-21 ENCOUNTER — Ambulatory Visit: Payer: Medicare HMO | Admitting: Family

## 2020-06-24 ENCOUNTER — Telehealth: Payer: Self-pay | Admitting: Internal Medicine

## 2020-06-24 NOTE — Telephone Encounter (Signed)
*  STAT* If patient is at the pharmacy, call can be transferred to refill team.   1. Which medications need to be refilled? (please list name of each medication and dose if known) eliquis 5 mg bid, losartan, amiodarone  2. Which pharmacy/location (including street and city if local pharmacy) is medication to be sent to?[facility] on [address]  3. Do they need a 30 day or 90 day supply? 30  Patient changed insurance and needs to scripts sent to new pharmacy

## 2020-06-25 MED ORDER — APIXABAN 5 MG PO TABS
5.0000 mg | ORAL_TABLET | Freq: Two times a day (BID) | ORAL | 5 refills | Status: DC
Start: 2020-06-25 — End: 2020-12-29

## 2020-06-25 MED ORDER — LOSARTAN POTASSIUM 25 MG PO TABS
12.5000 mg | ORAL_TABLET | Freq: Every day | ORAL | 0 refills | Status: DC
Start: 2020-06-25 — End: 2020-07-23

## 2020-06-25 MED ORDER — AMIODARONE HCL 200 MG PO TABS
200.0000 mg | ORAL_TABLET | Freq: Every day | ORAL | 0 refills | Status: DC
Start: 2020-06-25 — End: 2020-07-23

## 2020-06-25 NOTE — Telephone Encounter (Signed)
Please schedule overdue 3 month F/U with Dr. Saunders Revel. Thank you!

## 2020-06-25 NOTE — Telephone Encounter (Signed)
Scheduled for 11/2

## 2020-06-25 NOTE — Addendum Note (Signed)
Addended by: Brynda Peon on: 06/25/2020 01:16 PM   Modules accepted: Orders

## 2020-06-25 NOTE — Telephone Encounter (Signed)
Pt last saw Clint Fenton, PA on 05/10/20, last labs 05/26/20 Creat 0.85, age 75, weight 70.8kg, based on specified criteria pt is on appropriate dosage of Eliquis 5mg  BID.  Will refill rx.

## 2020-06-25 NOTE — Telephone Encounter (Signed)
Patient also requesting refill of Eliquis.   Requested Prescriptions   Signed Prescriptions Disp Refills   amiodarone (PACERONE) 200 MG tablet 30 tablet 0    Sig: Take 1 tablet (200 mg total) by mouth daily.    Authorizing Provider: END, CHRISTOPHER    Ordering User: NEWCOMER MCCLAIN, Orla Jolliff L   losartan (COZAAR) 25 MG tablet 15 tablet 0    Sig: Take 0.5 tablets (12.5 mg total) by mouth daily.    Authorizing Provider: END, CHRISTOPHER    Ordering User: Raelene Bott, Ryshawn Sanzone L

## 2020-06-28 DIAGNOSIS — C44629 Squamous cell carcinoma of skin of left upper limb, including shoulder: Secondary | ICD-10-CM | POA: Diagnosis not present

## 2020-06-28 DIAGNOSIS — D2362 Other benign neoplasm of skin of left upper limb, including shoulder: Secondary | ICD-10-CM | POA: Diagnosis not present

## 2020-06-29 ENCOUNTER — Ambulatory Visit: Payer: Medicare HMO | Admitting: Family

## 2020-06-29 ENCOUNTER — Telehealth: Payer: Self-pay | Admitting: Internal Medicine

## 2020-06-29 NOTE — Telephone Encounter (Signed)
Left message for patient to call back and schedule Medicare Annual Wellness Visit (AWV)   This should be a virtual visit only=30 minutes.  Last AWV 04/09/19; please schedule at anytime with Denisa O'Brien-Blaney at [facility].

## 2020-06-30 ENCOUNTER — Ambulatory Visit: Payer: Medicare Other | Admitting: Family

## 2020-07-01 ENCOUNTER — Ambulatory Visit: Payer: Medicare HMO | Admitting: Family

## 2020-07-21 ENCOUNTER — Ambulatory Visit: Payer: Medicare Other | Admitting: Cardiology

## 2020-07-23 ENCOUNTER — Other Ambulatory Visit: Payer: Self-pay | Admitting: Internal Medicine

## 2020-07-23 NOTE — Telephone Encounter (Signed)
Rx request sent to pharmacy.  

## 2020-08-04 ENCOUNTER — Other Ambulatory Visit: Payer: Self-pay

## 2020-08-04 ENCOUNTER — Ambulatory Visit: Payer: Medicare Other | Admitting: Cardiology

## 2020-08-04 ENCOUNTER — Encounter: Payer: Self-pay | Admitting: Cardiology

## 2020-08-04 VITALS — BP 144/80 | HR 59 | Ht 60.0 in | Wt 159.0 lb

## 2020-08-04 DIAGNOSIS — I4819 Other persistent atrial fibrillation: Secondary | ICD-10-CM | POA: Diagnosis not present

## 2020-08-04 DIAGNOSIS — I428 Other cardiomyopathies: Secondary | ICD-10-CM

## 2020-08-04 DIAGNOSIS — I5022 Chronic systolic (congestive) heart failure: Secondary | ICD-10-CM

## 2020-08-04 NOTE — Patient Instructions (Signed)
Medication Instructions:  - Your physician has recommended you make the following change in your medication:   1) STOP amiodarone  *If you need a refill on your cardiac medications before your next appointment, please call your pharmacy*   Lab Work: - none ordered  If you have labs (blood work) drawn today and your tests are completely normal, you will receive your results only by: Marland Kitchen MyChart Message (if you have MyChart) OR . A paper copy in the mail If you have any lab test that is abnormal or we need to change your treatment, we will call you to review the results.   Testing/Procedures: - none ordered   Follow-Up: At [facility], you and your health needs are our priority.  As part of our continuing mission to provide you with exceptional heart care, we have created designated Provider Care Teams.  These Care Teams include your primary Cardiologist (physician) and Advanced Practice Providers (APPs -  Physician Assistants and Nurse Practitioners) who all work together to provide you with the care you need, when you need it.  We recommend signing up for the patient portal called "MyChart".  Sign up information is provided on this After Visit Summary.  MyChart is used to connect with patients for Virtual Visits (Telemedicine).  Patients are able to view lab/test results, encounter notes, upcoming appointments, etc.  Non-urgent messages can be sent to your provider as well.   To learn more about what you can do with MyChart, go to [email or web address].    Your next appointment:   9 month(s)  The format for your next appointment:   In Person  Provider:   Lars Mage, MD   Other Instructions n/a

## 2020-08-04 NOTE — Progress Notes (Signed)
Electrophysiology Office Follow up Visit Note:    Date:  08/04/2020   ID:  Melissa Bonilla, DOB 03-06-1946, MRN [id number]  PCP:  Crecencio Mc, MD  [facility] Cardiologist:  Nelva Bush, MD  [facility] Electrophysiologist:  Vickie Epley, MD    Interval History:    Melissa Bonilla is a 75 y.o. female who presents for a follow up visit after successful atrial fibrillation ablation on on April 09, 2020.  During the ablation I ablated the pulmonary veins and posterior wall.  She has been maintained on amiodarone since before the ablation procedure.  She feels very well and has not had a recurrence of atrial fibrillation.  She continues to take Eliquis for stroke prophylaxis without bleeding issues.    Past Medical History:  Diagnosis Date  . Arrhythmia    atrial fibrillation  . CHF (congestive heart failure) ([place])   . Depression   . Hyperlipidemia   . Hypertension   . Menopausal syndrome (hot flashes) 1992   used HRT fo 2 yrs   . Obstructive sleep apnea   . Persistent atrial fibrillation [facility])     Past Surgical History:  Procedure Laterality Date  . APPENDECTOMY  1972  . ATRIAL FIBRILLATION ABLATION N/A 04/09/2020   Procedure: ATRIAL FIBRILLATION ABLATION;  Surgeon: Vickie Epley, MD;  Location: [facility] [facility];  Service: Cardiovascular;  Laterality: N/A;  . CARDIOVERSION N/A 11/28/2018   Procedure: CARDIOVERSION;  Surgeon: [name], MD;  Location: [facility];  Service: Cardiovascular;  Laterality: N/A;  . CARDIOVERSION N/A 12/23/2018   Procedure: CARDIOVERSION;  Surgeon: [name], MD;  Location: [facility] OR[facility];  Service: Cardiovascular;  Laterality: N/A;  . CARDIOVERSION N/A 09/30/2019   Procedure: CARDIOVERSION;  Surgeon: Nelva Bush, MD;  Location: [place] [facility];  Service: Cardiovascular;  Laterality: N/A;  . CESAREAN SECTION     [id number], 1972  . TEE WITHOUT CARDIOVERSION N/A 11/28/2018   Procedure: TRANSESOPHAGEAL ECHOCARDIOGRAM (TEE);   Surgeon: [name], MD;  Location: [facility];  Service: Cardiovascular;  Laterality: N/A;    Current Medications: Current Meds  Medication Sig  . apixaban (ELIQUIS) 5 MG TABS tablet Take 1 tablet (5 mg total) by mouth 2 (two) times daily.  Marland Kitchen ascorbic acid (VITAMIN C) 500 MG tablet Take 500 mg by mouth daily.  . cholecalciferol (VITAMIN D) 25 MCG (1000 UNIT) tablet Take 1,000 Units by mouth daily.  Marland Kitchen escitalopram (LEXAPRO) 10 MG tablet TAKE 1 TABLET (10 MG TOTAL) BY MOUTH DAILY.  Marland Kitchen losartan (COZAAR) 25 MG tablet TAKE 1/2 TABLET(12.5 MG) BY MOUTH DAILY  . Multiple Vitamin (MULTIVITAMIN WITH MINERALS) TABS tablet Take 1 tablet by mouth daily.  . vitamin E 180 MG (400 UNITS) capsule Take 400 Units by mouth daily.  . [DISCONTINUED] amiodarone (PACERONE) 200 MG tablet TAKE 1 TABLET(200 MG) BY MOUTH DAILY     Allergies:   Codeine, Morphine, and Sulfa antibiotics   Social History   Socioeconomic History  . Marital status: Married    Spouse name: Not on file  . Number of children: Not on file  . Years of education: Not on file  . Highest education level: Not on file  Occupational History    Employer: self    Comment: Owner of a cleaning Business  Tobacco Use  . Smoking status: Former Smoker    Packs/day: 0.25    Years: 15.00    Pack years: 3.75    Types: Cigarettes    Quit date:  1992    Years since quitting: 30.1  . Smokeless tobacco: Never Used  Vaping Use  . Vaping Use: Never used  Substance and Sexual Activity  . Alcohol use: Yes    Alcohol/week: 1.0 standard drink    Types: 1 Glasses of wine per week    Comment: weekly  . Drug use: No  . Sexual activity: Not on file  Other Topics Concern  . Not on file  Social History Narrative  . Not on file   Social Determinants of Health   Financial Resource Strain: Not on file  Food Insecurity: Not on file  Transportation Needs: Not on file  Physical Activity: Not on file  Stress: Not on file  Social Connections: Not  on file     Family History: The patient's family history includes Arthritis in her maternal grandmother and mother; Breast cancer (age of onset: 11) in her paternal aunt; Breast cancer (age of onset: 33) in her paternal aunt; Heart disease in her mother; Heart disease (age of onset: 65) in her father; Hypertension in her maternal grandmother and mother; Stroke in her maternal grandmother.  ROS:   Please see the history of present illness.    All other systems reviewed and are negative.  EKGs/Labs/Other Studies Reviewed:    The following studies were reviewed today:   EKG:  The ekg ordered today demonstrates normal sinus rhythm  Recent Labs: 04/08/2020: Hemoglobin 14.2; Platelets 183 05/10/2020: ALT 33; TSH 2.809 05/26/2020: BUN 20; Creatinine, Ser 0.85; Potassium 5.3; Sodium 137  Recent Lipid Panel    Component Value Date/Time   CHOL 145 11/26/2018 0430   TRIG 54 11/26/2018 0430   HDL 39 (L) 11/26/2018 0430   CHOLHDL 3.7 11/26/2018 0430   VLDL 11 11/26/2018 0430   LDLCALC 95 11/26/2018 0430   LDLDIRECT 175.5 03/19/2013 0930    Physical Exam:    VS:  BP (!) 144/80 (BP Location: Left Arm, Patient Position: Sitting, Cuff Size: Normal)   Pulse (!) 59   Ht 5' (1.524 m)   Wt 159 lb (72.1 kg)   SpO2 97%   BMI 31.05 kg/m     Wt Readings from Last 3 Encounters:  08/04/20 159 lb (72.1 kg)  05/10/20 156 lb (70.8 kg)  04/09/20 153 lb (69.4 kg)     GEN:  Well nourished, well developed in no acute distress HEENT: Normal NECK: No JVD; No carotid bruits LYMPHATICS: No lymphadenopathy CARDIAC: RRR, no murmurs, rubs, gallops RESPIRATORY:  Clear to auscultation without rales, wheezing or rhonchi  ABDOMEN: Soft, non-tender, non-distended MUSCULOSKELETAL:  No edema; No deformity  SKIN: Warm and dry NEUROLOGIC:  Alert and oriented x 3 PSYCHIATRIC:  Normal affect   ASSESSMENT:    1. Persistent atrial fibrillation ([place])   2. Nonischemic cardiomyopathy ([place])   3. Chronic  systolic heart failure ([facility])    PLAN:    In order of problems listed above:  1. Persistent atrial fibrillation Maintaining sinus rhythm after PVI+posterior wall ablation. Doing well on Eliquis. I would like to stop the amiodarone to avoid associated off-target effects. She is happy about this. Will continue eliquis. Plan to see back in 9 months.  2. NICM/Chronic HFrEF Likely related to AF.    Medication Adjustments/Labs and Tests Ordered: Current medicines are reviewed at length with the patient today.  Concerns regarding medicines are outlined above.  Orders Placed This Encounter  Procedures  . EKG 12-Lead   No orders of the defined types were placed in this encounter.  Signed, Lars Mage, MD, [facility]  08/04/2020 9:09 PM    Electrophysiology [place]

## 2020-10-07 DIAGNOSIS — G4733 Obstructive sleep apnea (adult) (pediatric): Secondary | ICD-10-CM | POA: Diagnosis not present

## 2020-11-29 DIAGNOSIS — D225 Melanocytic nevi of trunk: Secondary | ICD-10-CM | POA: Diagnosis not present

## 2020-11-29 DIAGNOSIS — Z85828 Personal history of other malignant neoplasm of skin: Secondary | ICD-10-CM | POA: Diagnosis not present

## 2020-11-29 DIAGNOSIS — D2262 Melanocytic nevi of left upper limb, including shoulder: Secondary | ICD-10-CM | POA: Diagnosis not present

## 2020-11-29 DIAGNOSIS — D2261 Melanocytic nevi of right upper limb, including shoulder: Secondary | ICD-10-CM | POA: Diagnosis not present

## 2020-12-14 DIAGNOSIS — D0461 Carcinoma in situ of skin of right upper limb, including shoulder: Secondary | ICD-10-CM | POA: Diagnosis not present

## 2020-12-14 DIAGNOSIS — L570 Actinic keratosis: Secondary | ICD-10-CM | POA: Diagnosis not present

## 2020-12-29 ENCOUNTER — Other Ambulatory Visit: Payer: Self-pay | Admitting: Internal Medicine

## 2020-12-29 NOTE — Telephone Encounter (Signed)
51f, 72.1kg, scr 0.85 05/26/20, lovw/lambert 08/04/20

## 2020-12-29 NOTE — Telephone Encounter (Signed)
Refill Request.  

## 2021-01-10 DIAGNOSIS — G4733 Obstructive sleep apnea (adult) (pediatric): Secondary | ICD-10-CM | POA: Diagnosis not present

## 2021-02-10 ENCOUNTER — Other Ambulatory Visit: Payer: Self-pay | Admitting: Internal Medicine

## 2021-02-10 NOTE — Telephone Encounter (Signed)
LMOV  

## 2021-02-10 NOTE — Telephone Encounter (Signed)
Please contact pt for future appointment. Pt overdue for 3 month f/u w/Dr. End. Pt needing refills.

## 2021-02-15 DIAGNOSIS — G4733 Obstructive sleep apnea (adult) (pediatric): Secondary | ICD-10-CM | POA: Diagnosis not present

## 2021-03-13 ENCOUNTER — Other Ambulatory Visit: Payer: Self-pay | Admitting: Internal Medicine

## 2021-03-14 ENCOUNTER — Telehealth: Payer: Self-pay

## 2021-03-14 NOTE — Telephone Encounter (Signed)
L mom to schedule 

## 2021-03-14 NOTE — Telephone Encounter (Signed)
-----   Message from Horton Finer sent at 03/14/2021  9:32 AM EDT ----- Regarding: needs appt for refills Please schedule overdue F/U appointment with Dr. Saunders Revel for refills. Thank you!

## 2021-03-21 DIAGNOSIS — L820 Inflamed seborrheic keratosis: Secondary | ICD-10-CM | POA: Diagnosis not present

## 2021-03-21 DIAGNOSIS — D045 Carcinoma in situ of skin of trunk: Secondary | ICD-10-CM | POA: Diagnosis not present

## 2021-03-21 DIAGNOSIS — Z85828 Personal history of other malignant neoplasm of skin: Secondary | ICD-10-CM | POA: Diagnosis not present

## 2021-03-21 DIAGNOSIS — L821 Other seborrheic keratosis: Secondary | ICD-10-CM | POA: Diagnosis not present

## 2021-03-21 DIAGNOSIS — D2262 Melanocytic nevi of left upper limb, including shoulder: Secondary | ICD-10-CM | POA: Diagnosis not present

## 2021-03-21 DIAGNOSIS — D225 Melanocytic nevi of trunk: Secondary | ICD-10-CM | POA: Diagnosis not present

## 2021-03-22 ENCOUNTER — Ambulatory Visit: Payer: Medicare Other | Admitting: Internal Medicine

## 2021-04-12 ENCOUNTER — Other Ambulatory Visit: Payer: Self-pay

## 2021-04-12 ENCOUNTER — Ambulatory Visit (INDEPENDENT_AMBULATORY_CARE_PROVIDER_SITE_OTHER): Payer: Medicare Other | Admitting: Internal Medicine

## 2021-04-12 ENCOUNTER — Encounter: Payer: Self-pay | Admitting: Internal Medicine

## 2021-04-12 VITALS — BP 120/72 | HR 65 | Temp 96.0°F | Ht 60.0 in | Wt 155.0 lb

## 2021-04-12 DIAGNOSIS — I428 Other cardiomyopathies: Secondary | ICD-10-CM

## 2021-04-12 DIAGNOSIS — I10 Essential (primary) hypertension: Secondary | ICD-10-CM | POA: Diagnosis not present

## 2021-04-12 DIAGNOSIS — Z0000 Encounter for general adult medical examination without abnormal findings: Secondary | ICD-10-CM

## 2021-04-12 DIAGNOSIS — I700 Atherosclerosis of aorta: Secondary | ICD-10-CM

## 2021-04-12 DIAGNOSIS — F4323 Adjustment disorder with mixed anxiety and depressed mood: Secondary | ICD-10-CM

## 2021-04-12 DIAGNOSIS — D6869 Other thrombophilia: Secondary | ICD-10-CM | POA: Diagnosis not present

## 2021-04-12 DIAGNOSIS — Z780 Asymptomatic menopausal state: Secondary | ICD-10-CM

## 2021-04-12 DIAGNOSIS — Z1231 Encounter for screening mammogram for malignant neoplasm of breast: Secondary | ICD-10-CM

## 2021-04-12 DIAGNOSIS — E785 Hyperlipidemia, unspecified: Secondary | ICD-10-CM | POA: Diagnosis not present

## 2021-04-12 DIAGNOSIS — Z23 Encounter for immunization: Secondary | ICD-10-CM | POA: Diagnosis not present

## 2021-04-12 MED ORDER — TETANUS-DIPHTH-ACELL PERTUSSIS 5-2.5-18.5 LF-MCG/0.5 IM SUSY: 0.5000 mL | PREFILLED_SYRINGE | Freq: Once | INTRAMUSCULAR | 0 refills | Status: AC

## 2021-04-12 MED ORDER — ZOSTER VAC RECOMB ADJUVANTED 50 MCG/0.5ML IM SUSR: 0.5000 mL | Freq: Once | INTRAMUSCULAR | 1 refills | Status: AC

## 2021-04-12 NOTE — Assessment & Plan Note (Addendum)
Managed with lexapro.  No changes today

## 2021-04-12 NOTE — Assessment & Plan Note (Signed)
She denies dyspnea at rest and with walking.  Ambulatory HR is usually 105 or below via Fit bit.

## 2021-04-12 NOTE — Progress Notes (Signed)
Patient ID: Melissa Bonilla, female    DOB: 09/26/1945  Age: 75 y.o. MRN: [id number]  The patient is here for annual preventive examination and management of other chronic and acute problems.  This visit occurred during the SARS-CoV-2 public health emergency.  Safety protocols were in place, including screening questions prior to the visit, additional usage of staff PPE, and extensive cleaning of exam room while observing appropriate contact time as indicated for disinfecting solutions.     The risk factors are reflected in the social history.  The roster of all physicians providing medical care to patient - is listed in the Snapshot section of the chart.  Activities of daily living:  The patient is 100% independent in all ADLs: dressing, toileting, feeding as well as independent mobility  Home safety : The patient has smoke detectors in the home. They wear seatbelts.  There are no firearms at home. There is no violence in the home.   There is no risks for hepatitis, STDs or HIV. There is no   history of blood transfusion. They have no travel history to infectious disease endemic areas of the world.  The patient has seen their dentist in the last six month. They have seen their eye doctor in the last year. They admit to slight hearing difficulty with regard to whispered voices and some television programs.  They have deferred audiologic testing in the last year.  They do not  have excessive sun exposure. Discussed the need for sun protection: hats, long sleeves and use of sunscreen if there is significant sun exposure.   Diet: the importance of a healthy diet is discussed. They do have a healthy diet.  The benefits of regular aerobic exercise were discussed. She walks  6 days per week ,  20 minutes.   Depression screen: there are no signs or vegative symptoms of depression- irritability, change in appetite, anhedonia, sadness/tearfullness.  Cognitive assessment: the patient manages all their  financial and personal affairs and is actively engaged. They could relate day,date,year and events; recalled 2/3 objects at 3 minutes; performed clock-face test normally.  The following portions of the patient's history were reviewed and updated as appropriate: allergies, current medications, past family history, past medical history,  past surgical history, past social history  and problem list.  Visual acuity was not assessed per patient preference since she has regular follow up with her ophthalmologist. Hearing and body mass index were assessed and reviewed.   During the course of the visit the patient was educated and counseled about appropriate screening and preventive services including : fall prevention , diabetes screening, nutrition counseling, colorectal cancer screening, and recommended immunizations.    CC: The primary encounter diagnosis was Encounter for screening mammogram for malignant neoplasm of breast. Diagnoses of Hyperlipidemia with target LDL less than 100, Essential hypertension, Secondary hypercoagulable state ([place]), Postmenopausal estrogen deficiency, NICM (nonischemic cardiomyopathy) ([place]), Need for pneumococcal vaccination, Adjustment reaction with anxiety and depression, Aortic atherosclerosis ([place]), and Routine general medical examination at a health care facility were also pertinent to this visit.  OA affecting SI joints :  symptoms brought on with prolonged sitting/sleeping.  Resolve  with walking   Aortic arch atherosclerosis:  Reviewed findings of prior CT scan today..  Patient  Is no taking a statin.  Discussed the role of statin therapy in stablizing placque and preventing events.   Hypertension: patient checks blood pressure twice weekly at home.  Readings have been for the most part < 140/80 at rest .  Patient is following a reduced salt diet most days and is taking medications as prescribed     History Melissa Bonilla has a past medical history of Arrhythmia, CHF  (congestive heart failure) ([place]), Depression, Hyperlipidemia, Hypertension, Menopausal syndrome (hot flashes) (1992), Obstructive sleep apnea, and Persistent atrial fibrillation ([place]).   She has a past surgical history that includes Cesarean section; Appendectomy (1972); TEE without cardioversion (N/A, 11/28/2018); Cardioversion (N/A, 11/28/2018); Cardioversion (N/A, 12/23/2018); Cardioversion (N/A, 09/30/2019); and ATRIAL FIBRILLATION ABLATION (N/A, 04/09/2020).   Her family history includes Arthritis in her maternal grandmother and mother; Breast cancer (age of onset: 26) in her paternal aunt; Breast cancer (age of onset: 1) in her paternal aunt; Heart disease in her mother; Heart disease (age of onset: 4) in her father; Hypertension in her maternal grandmother and mother; Stroke in her maternal grandmother.She reports that she quit smoking about 30 years ago. Her smoking use included cigarettes. She has a 3.75 pack-year smoking history. She has never used smokeless tobacco. She reports current alcohol use of about 1.0 standard drink per week. She reports that she does not use drugs.  Outpatient Medications Prior to Visit  Medication Sig Dispense Refill   apixaban (ELIQUIS) 5 MG TABS tablet TAKE 1 TABLET(5 MG) BY MOUTH TWICE DAILY 180 tablet 1   ascorbic acid (VITAMIN C) 500 MG tablet Take 500 mg by mouth daily.     cholecalciferol (VITAMIN D) 25 MCG (1000 UNIT) tablet Take 1,000 Units by mouth daily.     escitalopram (LEXAPRO) 10 MG tablet TAKE 1 TABLET (10 MG TOTAL) BY MOUTH DAILY. 90 tablet 1   losartan (COZAAR) 25 MG tablet Take 0.5 tablets (12.5 mg total) by mouth daily. MUST BE SEEN IN CLINIC FOR FURTHER REFILLS. 15 tablet 0   Multiple Vitamin (MULTIVITAMIN WITH MINERALS) TABS tablet Take 1 tablet by mouth daily.     pantoprazole (PROTONIX) 40 MG tablet Take 1 tablet (40 mg total) by mouth daily. 90 tablet 0   vitamin E 180 MG (400 UNITS) capsule Take 400 Units by mouth daily. (Patient not  taking: Reported on 04/12/2021)     No facility-administered medications prior to visit.    Review of Systems  Patient denies headache, fevers, malaise, unintentional weight loss, skin rash, eye pain, sinus congestion and sinus pain, sore throat, dysphagia,  hemoptysis , cough, dyspnea, wheezing, chest pain, palpitations, orthopnea, edema, abdominal pain, nausea, melena, diarrhea, constipation, flank pain, dysuria, hematuria, urinary  Frequency, nocturia, numbness, tingling, seizures,  Focal weakness, Loss of consciousness,  Tremor, insomnia, depression, anxiety, and suicidal ideation.     Objective:  BP 120/72 (BP Location: Left Arm, Patient Position: Sitting, Cuff Size: Normal)   Pulse 65   Temp (!) 96 F (35.6 C) (Temporal)   Ht 5' (1.524 m)   Wt 155 lb (70.3 kg)   SpO2 98%   BMI 30.27 kg/m   Physical Exam . General appearance: alert, cooperative and appears stated age Head: Normocephalic, without obvious abnormality, atraumatic Eyes: conjunctivae/corneas clear. PERRL, EOM's intact. Fundi benign. Ears: normal TM's and external ear canals both ears Nose: Nares normal. Septum midline. Mucosa normal. No drainage or sinus tenderness. Throat: lips, mucosa, and tongue normal; teeth and gums normal Neck: no adenopathy, no carotid bruit, no JVD, supple, symmetrical, trachea midline and thyroid not enlarged, symmetric, no tenderness/mass/nodules Lungs: clear to auscultation bilaterally Breasts: normal appearance, no masses or tenderness Heart: regular rate and rhythm, S1, S2 normal, no murmur, click, rub or gallop Abdomen: soft, non-tender; bowel sounds normal;  no masses,  no organomegaly Extremities: extremities normal, atraumatic, no cyanosis or edema Pulses: 2+ and symmetric Skin: Skin color, texture, turgor normal. No rashes or lesions Neurologic: Alert and oriented X 3, normal strength and tone. Normal symmetric reflexes. Normal coordination and gait.     Assessment & Plan:    Problem List Items Addressed This Visit     Acquired thrombophilia ([place])    SECONDARY TO CHRONIC ATRIAL FIBRILLATION. MANAGED WITH ELIQUIS       Hyperlipidemia with target LDL less than 100    Historically untreated due to patient preference,  But given the aortic atherosclerosis noted on recent cardiac CT, recommending statin therapy  Lab Results  Component Value Date   CHOL 239 (H) 04/12/2021   HDL 51.40 04/12/2021   LDLCALC 160 (H) 04/12/2021   LDLDIRECT 175.5 03/19/2013   TRIG 136.0 04/12/2021   CHOLHDL 5 04/12/2021         Relevant Medications   rosuvastatin (CRESTOR) 10 MG tablet (Start on 04/14/2021)   Other Relevant Orders   Lipid panel (Completed)   Comprehensive metabolic panel   Adjustment reaction with anxiety and depression    Managed with lexapro.  No changes today       Essential hypertension    .Well controlled on current regimen. Renal function stable, no changes today.  Lab Results  Component Value Date   CREATININE 0.86 04/12/2021   Lab Results  Component Value Date   NA 138 04/12/2021   K 4.3 04/12/2021   CL 103 04/12/2021   CO2 29 04/12/2021         Relevant Medications   rosuvastatin (CRESTOR) 10 MG tablet (Start on 04/14/2021)   Other Relevant Orders   Comprehensive metabolic panel (Completed)   NICM (nonischemic cardiomyopathy) ([place])    She denies dyspnea at rest and with walking.  Ambulatory HR is usually 105 or below via Fit bit.       Relevant Medications   rosuvastatin (CRESTOR) 10 MG tablet (Start on 04/14/2021)   Aortic atherosclerosis ([facility])    Aortic atherosclerosis :  Discussed need for statin therapy given documented evidence of moderate  atherosclerosis in the aorta noted on recent  CT of the coronaries  and the prognostic implications of this finding.       Relevant Medications   rosuvastatin (CRESTOR) 10 MG tablet (Start on 04/14/2021)   Routine general medical examination at a health care facility    age  appropriate education and counseling updated, referrals for preventative services and immunizations addressed, dietary and smoking counseling addressed, most recent labs reviewed.  I have personally reviewed and have noted:   1) the patient's medical and social history 2) The pt's use of alcohol, tobacco, and illicit drugs 3) The patient's current medications and supplements 4) Functional ability including ADL's, fall risk, home safety risk, hearing and visual impairment 5) Diet and physical activities 6) Evidence for depression or mood disorder 7) The patient's height, weight, and BMI have been recorded in the chart   I have made referrals, and provided counseling and education based on review of the above      Other Visit Diagnoses     Encounter for screening mammogram for malignant neoplasm of breast    -  Primary   Relevant Orders   MM 3D SCREEN BREAST BILATERAL   Postmenopausal estrogen deficiency       Relevant Orders   DG Bone Density   Need for pneumococcal vaccination  Relevant Orders   Pneumococcal conjugate vaccine 20-valent (Prevnar 20) (Completed)       I have discontinued Steffanie M. Ciesla's vitamin E and pantoprazole. I am also having her start on Tdap, Zoster Vaccine Adjuvanted, and rosuvastatin. Additionally, I am having her maintain her multivitamin with minerals, escitalopram, ascorbic acid, cholecalciferol, Eliquis, and losartan.  Meds ordered this encounter  Medications   Tdap (BOOSTRIX) 5-2.5-18.5 LF-MCG/0.5 injection    Sig: Inject 0.5 mLs into the muscle once for 1 dose.    Dispense:  0.5 mL    Refill:  0   Zoster Vaccine Adjuvanted [facility]) injection    Sig: Inject 0.5 mLs into the muscle once for 1 dose.    Dispense:  1 each    Refill:  1   rosuvastatin (CRESTOR) 10 MG tablet    Sig: Take 1 tablet (10 mg total) by mouth 2 (two) times a week.    Dispense:  24 tablet    Refill:  3    Medications Discontinued During This Encounter   Medication Reason   pantoprazole (PROTONIX) 40 MG tablet    vitamin E 180 MG (400 UNITS) capsule     Follow-up: No follow-ups on file.   Crecencio Mc, MD

## 2021-04-12 NOTE — Assessment & Plan Note (Signed)
SECONDARY TO CHRONIC ATRIAL FIBRILLATION. [place]

## 2021-04-12 NOTE — Patient Instructions (Signed)
Your bone density test  and your mammogram have been ordered.  Please call to make your appointment at [facility]     I  recommend that you get the TDaP vaccine at your pharmacy after January 1  The ShingRx vaccine will be free to you after January 1  as well  and IS ADVISED for all interested adults over 50 to prevent shingles

## 2021-04-13 DIAGNOSIS — I700 Atherosclerosis of aorta: Secondary | ICD-10-CM | POA: Insufficient documentation

## 2021-04-13 LAB — LIPID PANEL
Cholesterol: 239 mg/dL — ABNORMAL HIGH (ref 0–200)
HDL: 51.4 mg/dL (ref >39.00)
LDL Cholesterol: 160 mg/dL — ABNORMAL HIGH (ref 0–99)
NonHDL: 187.44
Total CHOL/HDL Ratio: 5
Triglycerides: 136 mg/dL (ref 0.0–149.0)
VLDL: 27.2 mg/dL (ref 0.0–40.0)

## 2021-04-13 LAB — COMPREHENSIVE METABOLIC PANEL
ALT: 17 U/L (ref 0–35)
AST: 21 U/L (ref 0–37)
Albumin: 4.2 g/dL (ref 3.5–5.2)
Alkaline Phosphatase: 82 U/L (ref 39–117)
BUN: 15 mg/dL (ref 6–23)
CO2: 29 mEq/L (ref 19–32)
Calcium: 9.2 mg/dL (ref 8.4–10.5)
Chloride: 103 mEq/L (ref 96–112)
Creatinine, Ser: 0.86 mg/dL (ref 0.40–1.20)
GFR: 66.24 mL/min (ref >60.00)
Glucose, Bld: 78 mg/dL (ref 70–99)
Potassium: 4.3 mEq/L (ref 3.5–5.1)
Sodium: 138 mEq/L (ref 135–145)
Total Bilirubin: 0.5 mg/dL (ref 0.2–1.2)
Total Protein: 6.5 g/dL (ref 6.0–8.3)

## 2021-04-13 LAB — CBC WITH DIFFERENTIAL/PLATELET
Basophils Absolute: 0.1 10*3/uL (ref 0.0–0.1)
Basophils Relative: 1.3 % (ref 0.0–3.0)
Eosinophils Absolute: 0.1 10*3/uL (ref 0.0–0.7)
Eosinophils Relative: 2 % (ref 0.0–5.0)
HCT: 39.7 % (ref 36.0–46.0)
Hemoglobin: 13.4 g/dL (ref 12.0–15.0)
Lymphocytes Relative: 39.4 % (ref 12.0–46.0)
Lymphs Abs: 2.2 10*3/uL (ref 0.7–4.0)
MCHC: 33.7 g/dL (ref 30.0–36.0)
MCV: 92 fl (ref 78.0–100.0)
Monocytes Absolute: 0.6 10*3/uL (ref 0.1–1.0)
Monocytes Relative: 10.8 % (ref 3.0–12.0)
Neutro Abs: 2.6 10*3/uL (ref 1.4–7.7)
Neutrophils Relative %: 46.5 % (ref 43.0–77.0)
Platelets: 191 10*3/uL (ref 150.0–400.0)
RBC: 4.31 Mil/uL (ref 3.87–5.11)
RDW: 12.7 % (ref 11.5–15.5)
WBC: 5.5 10*3/uL (ref 4.0–10.5)

## 2021-04-13 MED ORDER — ROSUVASTATIN CALCIUM 10 MG PO TABS
10.0000 mg | ORAL_TABLET | ORAL | 3 refills | Status: DC
Start: 2021-04-14 — End: 2022-04-26

## 2021-04-13 NOTE — Assessment & Plan Note (Signed)
Aortic atherosclerosis :  Discussed need for statin therapy given documented evidence of moderate  atherosclerosis in the aorta noted on recent  CT of the coronaries  and the prognostic implications of this finding.

## 2021-04-13 NOTE — Assessment & Plan Note (Signed)
Historically untreated due to patient preference,  But given the aortic atherosclerosis noted on recent cardiac CT, recommending statin therapy  Lab Results  Component Value Date   CHOL 239 (H) 04/12/2021   HDL 51.40 04/12/2021   LDLCALC 160 (H) 04/12/2021   LDLDIRECT 175.5 03/19/2013   TRIG 136.0 04/12/2021   CHOLHDL 5 04/12/2021

## 2021-04-13 NOTE — Assessment & Plan Note (Signed)
.  Well controlled on current regimen. Renal function stable, no changes today.  Lab Results  Component Value Date   CREATININE 0.86 04/12/2021   Lab Results  Component Value Date   NA 138 04/12/2021   K 4.3 04/12/2021   CL 103 04/12/2021   CO2 29 04/12/2021

## 2021-04-13 NOTE — Assessment & Plan Note (Signed)

## 2021-04-20 ENCOUNTER — Encounter: Payer: Self-pay | Admitting: Internal Medicine

## 2021-04-20 ENCOUNTER — Other Ambulatory Visit: Payer: Self-pay

## 2021-04-20 ENCOUNTER — Ambulatory Visit: Payer: Medicare Other | Admitting: Internal Medicine

## 2021-04-20 VITALS — BP 130/70 | HR 75 | Ht 60.0 in | Wt 154.0 lb

## 2021-04-20 DIAGNOSIS — I10 Essential (primary) hypertension: Secondary | ICD-10-CM | POA: Diagnosis not present

## 2021-04-20 DIAGNOSIS — I2510 Atherosclerotic heart disease of native coronary artery without angina pectoris: Secondary | ICD-10-CM | POA: Insufficient documentation

## 2021-04-20 DIAGNOSIS — I700 Atherosclerosis of aorta: Secondary | ICD-10-CM | POA: Diagnosis not present

## 2021-04-20 DIAGNOSIS — I4819 Other persistent atrial fibrillation: Secondary | ICD-10-CM

## 2021-04-20 DIAGNOSIS — I428 Other cardiomyopathies: Secondary | ICD-10-CM

## 2021-04-20 DIAGNOSIS — E7800 Pure hypercholesterolemia, unspecified: Secondary | ICD-10-CM

## 2021-04-20 NOTE — Progress Notes (Signed)
Follow-up Outpatient Visit Date: 04/20/2021  Primary Care Provider: Crecencio Mc, MD [address] [place] [address]  Chief Complaint: Follow-up atrial fibrillation, cardiomyopathy, and nonobstructive coronary artery disease  HPI:  [name] is a 75 y.o. female with history of persistent atrial fibrillation and atrial flutter, chronic HFrEF, hypertension, hyperlipidemia, depression, and remote tobacco use, who presents for follow-up of atrial fibrillation and chronic HFrEF.  I last saw her in 09/2019.  She has subsequently been followed closely by Drs. Caryl Comes and [place], ultimately undergoing a-fib ablation in 03/2020.  She last saw Dr. Quentin Ore in 07/2020, at which time she was feeling well without recurrence of atrial fibrillation.  Amiodarone was discontinued at that visit.  Most recent echo in 11/2019 had shown normalization of LVEF.  Coronary CTA in 12/2019 showed mild plaquing in the proximal LAD but otherwise no significant CAD.  Coronary calcium score was 34.  Aortic atherosclerosis was noted.  Today, [name] reports that she has been feeling well.  She occasionally notes transient palpitations lasting only a few seconds when she lies down at night.  She denies chest pain, shortness of breath, lightheadedness, and edema.  She has not been using her CPAP as much as it often comes off when she sleeps on her stomach.  She was just started on twice weekly rosuvastatin by Dr. Derrel Nip.  She remains on apixaban without bleeding.  --------------------------------------------------------------------------------------------------  Past Medical History:  Diagnosis Date   Arrhythmia    atrial fibrillation   CHF (congestive heart failure) ([place])    Depression    Hyperlipidemia    Hypertension    Menopausal syndrome (hot flashes) 1992   used HRT fo 2 yrs    Obstructive sleep apnea    Persistent atrial fibrillation [facility])    Past Surgical History:  Procedure Laterality Date    APPENDECTOMY  1972   ATRIAL FIBRILLATION ABLATION N/A 04/09/2020   Procedure: ATRIAL FIBRILLATION ABLATION;  Surgeon: Vickie Epley, MD;  Location: [place] [facility];  Service: Cardiovascular;  Laterality: N/A;   CARDIOVERSION N/A 11/28/2018   Procedure: CARDIOVERSION;  Surgeon: [name], MD;  Location: [place] [facility];  Service: Cardiovascular;  Laterality: N/A;   CARDIOVERSION N/A 12/23/2018   Procedure: CARDIOVERSION;  Surgeon: [name], MD;  Location: [place] [facility];  Service: Cardiovascular;  Laterality: N/A;   CARDIOVERSION N/A 09/30/2019   Procedure: CARDIOVERSION;  Surgeon: Nelva Bush, MD;  Location: [facility];  Service: Cardiovascular;  Laterality: N/A;   CESAREAN SECTION     [id number], 1972   TEE WITHOUT CARDIOVERSION N/A 11/28/2018   Procedure: TRANSESOPHAGEAL ECHOCARDIOGRAM (TEE);  Surgeon: [name], MD;  Location: [facility];  Service: Cardiovascular;  Laterality: N/A;    Current Meds  Medication Sig   apixaban (ELIQUIS) 5 MG TABS tablet TAKE 1 TABLET(5 MG) BY MOUTH TWICE DAILY   ascorbic acid (VITAMIN C) 500 MG tablet Take 500 mg by mouth daily.   cholecalciferol (VITAMIN D) 25 MCG (1000 UNIT) tablet Take 1,000 Units by mouth daily.   escitalopram (LEXAPRO) 10 MG tablet TAKE 1 TABLET (10 MG TOTAL) BY MOUTH DAILY.   losartan (COZAAR) 25 MG tablet Take 0.5 tablets (12.5 mg total) by mouth daily. MUST BE SEEN IN CLINIC FOR FURTHER REFILLS.   Multiple Vitamin (MULTIVITAMIN WITH MINERALS) TABS tablet Take 1 tablet by mouth daily.   rosuvastatin (CRESTOR) 10 MG tablet Take 1 tablet (10 mg total) by mouth 2 (two) times a week.    Allergies:  Codeine, Morphine, and Sulfa antibiotics  Social History   Tobacco Use   Smoking status: Former    Packs/day: 0.25    Years: 15.00    Pack years: 3.75    Types: Cigarettes    Quit date: 1992    Years since quitting: 30.8   Smokeless tobacco: Never  Vaping Use   Vaping Use: Never used  Substance Use Topics    Alcohol use: Not Currently    Comment: 1/2 glass of wine on occassion   Drug use: No    Family History  Problem Relation Age of Onset   Arthritis Mother        Rheumatoid   Heart disease Mother        Valvular   Hypertension Mother    Heart disease Father 58       CABG   Arthritis Maternal Grandmother        Rheumatoid   Hypertension Maternal Grandmother    Stroke Maternal Grandmother    Breast cancer Paternal Aunt 36   Breast cancer Paternal Aunt 66    Review of Systems: A 12-system review of systems was performed and was negative except as noted in the HPI.  --------------------------------------------------------------------------------------------------  Physical Exam: BP 130/70 (BP Location: Left Arm, Patient Position: Sitting, Cuff Size: Normal)   Pulse 75   Ht 5' (1.524 m)   Wt 154 lb (69.9 kg)   SpO2 95%   BMI 30.08 kg/m   General:  NAD. Neck: No JVD or HJR. Lungs: Clear to auscultation bilaterally without wheezes or crackles. Heart: Regular rate and rhythm without murmurs, rubs, or gallops. Abdomen: Soft, nontender, nondistended. Extremities: No lower extremity edema.  EKG: Normal sinus rhythm with anterolateral ST/T changes, similar to prior tracing from 08/04/2020.  Lab Results  Component Value Date   WBC 5.5 04/12/2021   HGB 13.4 04/12/2021   HCT 39.7 04/12/2021   MCV 92.0 04/12/2021   PLT 191.0 04/12/2021    Lab Results  Component Value Date   NA 138 04/12/2021   K 4.3 04/12/2021   CL 103 04/12/2021   CO2 29 04/12/2021   BUN 15 04/12/2021   CREATININE 0.86 04/12/2021   GLUCOSE 78 04/12/2021   ALT 17 04/12/2021    Lab Results  Component Value Date   CHOL 239 (H) 04/12/2021   HDL 51.40 04/12/2021   LDLCALC 160 (H) 04/12/2021   LDLDIRECT 175.5 03/19/2013   TRIG 136.0 04/12/2021   CHOLHDL 5 04/12/2021    --------------------------------------------------------------------------------------------------  ASSESSMENT AND  PLAN: Persistent atrial fibrillation: Patient reports only minimal palpitations and appears to be maintaining sinus rhythm status post ablation by Dr. Quentin Ore.  She is now off amiodarone.  She should continue apixaban 5 mg twice daily and follow-up with Dr. Quentin Ore as previously discussed.  Coronary artery disease and aortic atherosclerosis: No angina reported.  EKG again shows anterolateral ST/T changes, similar to prior tracing.  Coronary CTA last year showed mild, nonobstructive CAD as well as aortic atherosclerosis.  I agree with recent addition of rosuvastatin for target LDL less than 100 (ideally below 70), as tolerated.  We will defer adding aspirin given ongoing anticoagulation with apixaban.  Nonischemic cardiomyopathy: Ms. Leys appears euvolemic with NYHA class I symptoms in the setting of recovered LVEF.  I suspect cardiomyopathy was most likely tachycardia mediated and will hopefully remain absent with maintenance of sinus rhythm.  Continue current dose of losartan.  Hyperlipidemia: Agree with recent introduction of low-dose rosuvastatin.  Ongoing follow-up per Dr. Derrel Nip.  Hypertension: Blood pressure reasonable today.  Continue losartan 25 mg daily.  Follow-up: Return to clinic in 1 year.  Nelva Bush, MD 04/20/2021 10:59 AM

## 2021-04-20 NOTE — Patient Instructions (Signed)
Medication Instructions:   Your physician recommends that you continue on your current medications as directed. Please refer to the Current Medication list given to you today.  *If you need a refill on your cardiac medications before your next appointment, please call your pharmacy*   Lab Work:  None ordered  Testing/Procedures:  None ordered   Follow-Up: At [facility], you and your health needs are our priority.  As part of our continuing mission to provide you with exceptional heart care, we have created designated Provider Care Teams.  These Care Teams include your primary Cardiologist (physician) and Advanced Practice Providers (APPs -  Physician Assistants and Nurse Practitioners) who all work together to provide you with the care you need, when you need it.  We recommend signing up for the patient portal called "MyChart".  Sign up information is provided on this After Visit Summary.  MyChart is used to connect with patients for Virtual Visits (Telemedicine).  Patients are able to view lab/test results, encounter notes, upcoming appointments, etc.  Non-urgent messages can be sent to your provider as well.   To learn more about what you can do with MyChart, go to [email or web address].    Your next appointment:   1 year(s)  The format for your next appointment:   In Person  Provider:   You may see Christopher End, MD or one of the following Advanced Practice Providers on your designated Care Team:   Christopher Berge, NP Ryan Dunn, PA-C Jacquelyn Visser, PA-C Cadence Furth, PA-C  

## 2021-04-25 DIAGNOSIS — C44529 Squamous cell carcinoma of skin of other part of trunk: Secondary | ICD-10-CM | POA: Diagnosis not present

## 2021-04-25 DIAGNOSIS — L905 Scar conditions and fibrosis of skin: Secondary | ICD-10-CM | POA: Diagnosis not present

## 2021-04-29 ENCOUNTER — Other Ambulatory Visit: Payer: Self-pay | Admitting: Internal Medicine

## 2021-05-03 NOTE — Progress Notes (Signed)
Electrophysiology Office Follow up Visit Note:    Date:  05/04/2021   ID:  Melissa Bonilla, DOB 04-30-1946, MRN [id number]  PCP:  Crecencio Mc, MD  [facility] Cardiologist:  Nelva Bush, MD  [facility] Electrophysiologist:  Vickie Epley, MD    Interval History:    Melissa Bonilla is a 75 y.o. female who presents for a follow up visit after her A. fib ablation on April 09, 2020.  During that procedure the veins were isolated and the posterior wall was ablated. On an ECG obtained April 20, 2021 she was in normal sinus rhythm.  She has done very well since the ablation without recurrence of her atrial fibrillation.  She continues to take Eliquis without bleeding issues.    Past Medical History:  Diagnosis Date   Arrhythmia    atrial fibrillation   CHF (congestive heart failure) ([place])    Depression    Hyperlipidemia    Hypertension    Menopausal syndrome (hot flashes) 1992   used HRT fo 2 yrs    Obstructive sleep apnea    Persistent atrial fibrillation [facility])     Past Surgical History:  Procedure Laterality Date   APPENDECTOMY  1972   ATRIAL FIBRILLATION ABLATION N/A 04/09/2020   Procedure: ATRIAL FIBRILLATION ABLATION;  Surgeon: Vickie Epley, MD;  Location: [place] [facility];  Service: Cardiovascular;  Laterality: N/A;   CARDIOVERSION N/A 11/28/2018   Procedure: CARDIOVERSION;  Surgeon: [name], MD;  Location: [place] [facility];  Service: Cardiovascular;  Laterality: N/A;   CARDIOVERSION N/A 12/23/2018   Procedure: CARDIOVERSION;  Surgeon: [name], MD;  Location: [place] [facility];  Service: Cardiovascular;  Laterality: N/A;   CARDIOVERSION N/A 09/30/2019   Procedure: CARDIOVERSION;  Surgeon: Nelva Bush, MD;  Location: [facility];  Service: Cardiovascular;  Laterality: N/A;   CESAREAN SECTION     [id number], 1972   TEE WITHOUT CARDIOVERSION N/A 11/28/2018   Procedure: TRANSESOPHAGEAL ECHOCARDIOGRAM (TEE);  Surgeon: [name], MD;   Location: [facility];  Service: Cardiovascular;  Laterality: N/A;    Current Medications: Current Meds  Medication Sig   apixaban (ELIQUIS) 5 MG TABS tablet TAKE 1 TABLET(5 MG) BY MOUTH TWICE DAILY   ascorbic acid (VITAMIN C) 500 MG tablet Take 500 mg by mouth daily.   cholecalciferol (VITAMIN D) 25 MCG (1000 UNIT) tablet Take 1,000 Units by mouth daily.   escitalopram (LEXAPRO) 10 MG tablet TAKE 1 TABLET (10 MG TOTAL) BY MOUTH DAILY.   losartan (COZAAR) 25 MG tablet TAKE 1/2 TABLET(12.5 MG) BY MOUTH DAILY   Multiple Vitamin (MULTIVITAMIN WITH MINERALS) TABS tablet Take 1 tablet by mouth daily.   rosuvastatin (CRESTOR) 10 MG tablet Take 1 tablet (10 mg total) by mouth 2 (two) times a week.     Allergies:   Codeine, Morphine, and Sulfa antibiotics   Social History   Socioeconomic History   Marital status: Married    Spouse name: Not on file   Number of children: Not on file   Years of education: Not on file   Highest education level: Not on file  Occupational History    Employer: self    Comment: Owner of a cleaning Business  Tobacco Use   Smoking status: Former    Packs/day: 0.25    Years: 15.00    Pack years: 3.75    Types: Cigarettes    Quit date: 1992    Years since quitting: 30.8   Smokeless tobacco: Never  Vaping  Use   Vaping Use: Never used  Substance and Sexual Activity   Alcohol use: Not Currently    Comment: 1/2 glass of wine on occassion   Drug use: No   Sexual activity: Not on file  Other Topics Concern   Not on file  Social History Narrative   Not on file   Social Determinants of Health   Financial Resource Strain: Not on file  Food Insecurity: Not on file  Transportation Needs: Not on file  Physical Activity: Not on file  Stress: Not on file  Social Connections: Not on file     Family History: The patient's family history includes Arthritis in her maternal grandmother and mother; Breast cancer (age of onset: 58) in her paternal aunt; Breast  cancer (age of onset: 98) in her paternal aunt; Heart disease in her mother; Heart disease (age of onset: 14) in her father; Hypertension in her maternal grandmother and mother; Stroke in her maternal grandmother.  ROS:   Please see the history of present illness.    All other systems reviewed and are negative.  EKGs/Labs/Other Studies Reviewed:    The following studies were reviewed today:    EKG:  The ekg ordered today demonstrates normal sinus rhythm.  Recent Labs: 05/10/2020: TSH 2.809 04/12/2021: ALT 17; BUN 15; Creatinine, Ser 0.86; Hemoglobin 13.4; Platelets 191.0; Potassium 4.3; Sodium 138  Recent Lipid Panel    Component Value Date/Time   CHOL 239 (H) 04/12/2021 1434   TRIG 136.0 04/12/2021 1434   HDL 51.40 04/12/2021 1434   CHOLHDL 5 04/12/2021 1434   VLDL 27.2 04/12/2021 1434   LDLCALC 160 (H) 04/12/2021 1434   LDLDIRECT 175.5 03/19/2013 0930    Physical Exam:    VS:  BP (!) 142/80 (BP Location: Left Arm, Patient Position: Sitting, Cuff Size: Normal)   Pulse 61   Ht 5' (1.524 m)   Wt 158 lb (71.7 kg)   SpO2 96%   BMI 30.86 kg/m     Wt Readings from Last 3 Encounters:  05/04/21 158 lb (71.7 kg)  04/20/21 154 lb (69.9 kg)  04/12/21 155 lb (70.3 kg)     GEN:  Well nourished, well developed in no acute distress HEENT: Normal NECK: No JVD; No carotid bruits LYMPHATICS: No lymphadenopathy CARDIAC: RRR, no murmurs, rubs, gallops RESPIRATORY:  Clear to auscultation without rales, wheezing or rhonchi  ABDOMEN: Soft, non-tender, non-distended MUSCULOSKELETAL:  No edema; No deformity  SKIN: Warm and dry NEUROLOGIC:  Alert and oriented x 3 PSYCHIATRIC:  Normal affect        ASSESSMENT:    1. Persistent atrial fibrillation ([place])   2. Chronic diastolic heart failure (HCC)    PLAN:    In order of problems listed above:  #Persistent atrial fibrillation Maintaining sinus rhythm after her ablation on April 09, 2020.  She continues to take  Eliquis.  She is done very well without recurrence.  I would recommend she continue taking the Eliquis.  She did have a fair bit of scar tissue noted during the ablation procedure which I think increases her risk of recurrent A. fib as she ages.  At this point, we can follow-up on an as-needed basis.  #Chronic diastolic heart failure NYHA class I.  Warm and dry on exam.  Rhythm control important.  #Hypertension Controlled on home checks.  She should continue to check her blood pressures 1-2 times per week and follow-up with her primary care physician.  Follow-up as needed   Medication Adjustments/Labs  and Tests Ordered: Current medicines are reviewed at length with the patient today.  Concerns regarding medicines are outlined above.  No orders of the defined types were placed in this encounter.  No orders of the defined types were placed in this encounter.    Signed, Lars Mage, MD, [facility], [facility] 05/04/2021 10:09 AM    Electrophysiology [facility]

## 2021-05-04 ENCOUNTER — Other Ambulatory Visit: Payer: Self-pay

## 2021-05-04 ENCOUNTER — Ambulatory Visit: Payer: Medicare Other | Admitting: Cardiology

## 2021-05-04 ENCOUNTER — Encounter: Payer: Self-pay | Admitting: Cardiology

## 2021-05-04 VITALS — BP 142/80 | HR 61 | Ht 60.0 in | Wt 158.0 lb

## 2021-05-04 DIAGNOSIS — I5032 Chronic diastolic (congestive) heart failure: Secondary | ICD-10-CM

## 2021-05-04 DIAGNOSIS — I4819 Other persistent atrial fibrillation: Secondary | ICD-10-CM

## 2021-05-04 NOTE — Patient Instructions (Addendum)

## 2021-05-10 ENCOUNTER — Other Ambulatory Visit: Payer: Self-pay

## 2021-05-10 ENCOUNTER — Ambulatory Visit
Admission: RE | Admit: 2021-05-10 | Discharge: 2021-05-10 | Disposition: A | Discharge status: Home / Self Care | Payer: Medicare Other | Source: Ambulatory Visit | Attending: Internal Medicine | Admitting: Internal Medicine

## 2021-05-10 DIAGNOSIS — M810 Age-related osteoporosis without current pathological fracture: Secondary | ICD-10-CM | POA: Diagnosis not present

## 2021-05-10 DIAGNOSIS — Z780 Asymptomatic menopausal state: Secondary | ICD-10-CM | POA: Insufficient documentation

## 2021-05-14 ENCOUNTER — Encounter: Payer: Self-pay | Admitting: Internal Medicine

## 2021-05-14 DIAGNOSIS — M810 Age-related osteoporosis without current pathological fracture: Secondary | ICD-10-CM | POA: Insufficient documentation

## 2021-05-25 ENCOUNTER — Other Ambulatory Visit: Payer: Self-pay

## 2021-05-25 ENCOUNTER — Ambulatory Visit
Admission: RE | Admit: 2021-05-25 | Discharge: 2021-05-25 | Disposition: A | Discharge status: Home / Self Care | Payer: Medicare Other | Source: Ambulatory Visit | Attending: Internal Medicine | Admitting: Internal Medicine

## 2021-05-25 DIAGNOSIS — Z1231 Encounter for screening mammogram for malignant neoplasm of breast: Secondary | ICD-10-CM | POA: Diagnosis not present

## 2021-06-28 ENCOUNTER — Other Ambulatory Visit: Payer: Self-pay | Admitting: Cardiology

## 2021-06-28 DIAGNOSIS — I4819 Other persistent atrial fibrillation: Secondary | ICD-10-CM

## 2021-06-28 NOTE — Telephone Encounter (Signed)
Eliquis 5mg  refill request received. Patient is 76 years old, weight-71.7kg, Crea-0.86 on 04/12/2021, Diagnosis-Afib, and last seen by Dr. Quentin Ore on 05/04/2021. Dose is appropriate based on dosing criteria. Will send in refill to requested pharmacy.

## 2021-06-29 DIAGNOSIS — M545 Low back pain, unspecified: Secondary | ICD-10-CM | POA: Diagnosis not present

## 2021-06-30 DIAGNOSIS — G4733 Obstructive sleep apnea (adult) (pediatric): Secondary | ICD-10-CM | POA: Diagnosis not present

## 2021-07-18 ENCOUNTER — Telehealth: Payer: Self-pay | Admitting: Internal Medicine

## 2021-07-18 ENCOUNTER — Other Ambulatory Visit: Payer: Self-pay

## 2021-07-18 ENCOUNTER — Ambulatory Visit (INDEPENDENT_AMBULATORY_CARE_PROVIDER_SITE_OTHER): Payer: Medicare Other | Admitting: Family

## 2021-07-18 ENCOUNTER — Encounter: Payer: Self-pay | Admitting: Family

## 2021-07-18 VITALS — BP 130/82 | HR 82 | Temp 97.0°F | Ht 60.0 in | Wt 149.0 lb

## 2021-07-18 DIAGNOSIS — R300 Dysuria: Secondary | ICD-10-CM | POA: Diagnosis not present

## 2021-07-18 DIAGNOSIS — N3000 Acute cystitis without hematuria: Secondary | ICD-10-CM | POA: Diagnosis not present

## 2021-07-18 DIAGNOSIS — M545 Low back pain, unspecified: Secondary | ICD-10-CM

## 2021-07-18 LAB — POCT URINALYSIS DIPSTICK
Bilirubin, UA: NEGATIVE
Glucose, UA: NEGATIVE
Ketones, UA: NEGATIVE
Leukocytes, UA: NEGATIVE
Nitrite, UA: NEGATIVE
Protein, UA: NEGATIVE
Spec Grav, UA: 1.015 (ref 1.010–1.025)
Urobilinogen, UA: 0.2 E.U./dL
pH, UA: 5 (ref 5.0–8.0)

## 2021-07-18 MED ORDER — CIPROFLOXACIN HCL 500 MG PO TABS: 500.0000 mg | ORAL_TABLET | Freq: Two times a day (BID) | ORAL | 0 refills | Status: AC

## 2021-07-18 NOTE — Patient Instructions (Signed)
It was a pleasure seeing you today.   You were found to have a urinary tract infection, you have been prescribed an antibiotic to your preferred pharmacy. Please start antibiotic today as directed.   We are sending your urine for a culture to make sure you do not have a resistant bacteria. We will call you if we need to change your medications.   Please make sure you are drinking plenty of fluids over the next few days.  If your symptoms do not improve over the next 5-7 days, or if they worsen, please let us know. Please also let us know if you have worsening back pain, fevers, chills, or body aches.   Regards,   Kailani Brass  

## 2021-07-18 NOTE — Assessment & Plan Note (Signed)
Treated when had sciatica last week with orthopedist, much relief with steroid pack. Pt to continue taking. Less likely kidney at this time considering location/ however pt to let me know if intermittent stabbing more mod/severe pain and will order CT abd pelvis with kidney protocol.

## 2021-07-18 NOTE — Assessment & Plan Note (Signed)
Choosing to treat for uti based off of symptoms.  Pt advised of the following:  You were found to have a urinary tract infection, you have been prescribed an antibiotic to your preferred pharmacy. Please start antibiotic today as directed.   We are sending your urine for a culture to make sure you do not have a resistant bacteria. We will call you if we need to change your medications.   Please make sure you are drinking plenty of fluids over the next few days.  If your symptoms do not improve over the next 5-7 days, or if they worsen, please let Melissa Bonilla know. Please also let Melissa Bonilla know if you have worsening back pain, fevers, chills, or body aches.

## 2021-07-18 NOTE — Assessment & Plan Note (Signed)
Urine dipstick in office today, urine/culture sent and pending. Choosing to treat today in office with cipro 500 mg po bid x 7 days. Will order ct abd/pelvis kidney protocol if pain comes back/sharp stabbing lower back to r/o nephrolithiasis.

## 2021-07-18 NOTE — Telephone Encounter (Signed)
Noted  

## 2021-07-18 NOTE — Progress Notes (Signed)
Established Patient Office Visit  Subjective:  Patient ID: Melissa Bonilla, female    DOB: 1945-09-20  Age: 76 y.o. MRN: [id number]  CC:  Chief Complaint  Patient presents with   Urinary Tract Infection    HPI Melissa Bonilla is here today with concerns.   Started with bil lower buttock pain, inflammation and went to Dr. Rogers Blocker, her orthopedist who gave her prednisone as xrays did show some inflammation. Did have radiation down bil lower legs however resolved with prednisone.   She did notice in the last three days she had urinary frequency which is still ongoing. She has urinary urgency with some urgency. The back pain is now on the right lower back/buttock area. She has had some chills/ no fever. Taking tylenol which has helped her some.   Past Medical History:  Diagnosis Date   Arrhythmia    atrial fibrillation   CHF (congestive heart failure) ([place])    Depression    Hyperlipidemia    Hypertension    Menopausal syndrome (hot flashes) 1992   used HRT fo 2 yrs    Obstructive sleep apnea    Persistent atrial fibrillation [facility])     Past Surgical History:  Procedure Laterality Date   APPENDECTOMY  1972   ATRIAL FIBRILLATION ABLATION N/A 04/09/2020   Procedure: ATRIAL FIBRILLATION ABLATION;  Surgeon: Vickie Epley, MD;  Location: [place] [facility];  Service: Cardiovascular;  Laterality: N/A;   CARDIOVERSION N/A 11/28/2018   Procedure: CARDIOVERSION;  Surgeon: [name], MD;  Location: [place] [facility];  Service: Cardiovascular;  Laterality: N/A;   CARDIOVERSION N/A 12/23/2018   Procedure: CARDIOVERSION;  Surgeon: [name], MD;  Location: [place] [facility];  Service: Cardiovascular;  Laterality: N/A;   CARDIOVERSION N/A 09/30/2019   Procedure: CARDIOVERSION;  Surgeon: Nelva Bush, MD;  Location: [facility];  Service: Cardiovascular;  Laterality: N/A;   CESAREAN SECTION     [id number], 1972   TEE WITHOUT CARDIOVERSION N/A 11/28/2018   Procedure: TRANSESOPHAGEAL  ECHOCARDIOGRAM (TEE);  Surgeon: [name], MD;  Location: [facility];  Service: Cardiovascular;  Laterality: N/A;    Family History  Problem Relation Age of Onset   Arthritis Mother        Rheumatoid   Heart disease Mother        Valvular   Hypertension Mother    Heart disease Father 60       CABG   Arthritis Maternal Grandmother        Rheumatoid   Hypertension Maternal Grandmother    Stroke Maternal Grandmother    Breast cancer Paternal Aunt 80   Breast cancer Paternal Aunt 25    Social History   Socioeconomic History   Marital status: Married    Spouse name: Not on file   Number of children: Not on file   Years of education: Not on file   Highest education level: Not on file  Occupational History    Employer: self    Comment: Owner of a cleaning Business  Tobacco Use   Smoking status: Former    Packs/day: 0.25    Years: 15.00    Pack years: 3.75    Types: Cigarettes    Quit date: 1992    Years since quitting: 31.1   Smokeless tobacco: Never  Vaping Use   Vaping Use: Never used  Substance and Sexual Activity   Alcohol use: Not Currently    Comment: 1/2 glass of wine on occassion   Drug use: No  Sexual activity: Not on file  Other Topics Concern   Not on file  Social History Narrative   Not on file   Social Determinants of Health   Financial Resource Strain: Not on file  Food Insecurity: Not on file  Transportation Needs: Not on file  Physical Activity: Not on file  Stress: Not on file  Social Connections: Not on file  Intimate Partner Violence: Not on file    Outpatient Medications Prior to Visit  Medication Sig Dispense Refill   ascorbic acid (VITAMIN C) 500 MG tablet Take 500 mg by mouth daily.     cholecalciferol (VITAMIN D) 25 MCG (1000 UNIT) tablet Take 1,000 Units by mouth daily.     ELIQUIS 5 MG TABS tablet TAKE 1 TABLET(5 MG) BY MOUTH TWICE DAILY 180 tablet 1   escitalopram (LEXAPRO) 10 MG tablet TAKE 1 TABLET (10 MG TOTAL) BY MOUTH  DAILY. 90 tablet 1   losartan (COZAAR) 25 MG tablet TAKE 1/2 TABLET(12.5 MG) BY MOUTH DAILY 15 tablet 5   Multiple Vitamin (MULTIVITAMIN WITH MINERALS) TABS tablet Take 1 tablet by mouth daily.     rosuvastatin (CRESTOR) 10 MG tablet Take 1 tablet (10 mg total) by mouth 2 (two) times a week. (Patient not taking: Reported on 07/18/2021) 24 tablet 3   No facility-administered medications prior to visit.    Allergies  Allergen Reactions   Codeine Nausea Only and Rash   Morphine Nausea And Vomiting    Pt allergy may be codeine as codeine or morphine were combined on previous health history. Pt reported upset stomach on questionnaire.   Sulfa Antibiotics Nausea And Vomiting    ROS Review of Systems  Constitutional:  Positive for chills. Negative for fever.  Gastrointestinal:  Negative for abdominal pain.  Genitourinary:  Positive for dysuria, frequency, pelvic pain (right suprapubic tenderness) and urgency. Negative for difficulty urinating, flank pain, hematuria and vaginal discharge.  Musculoskeletal:  Positive for arthralgias (mid low back pain).     Objective:    Physical Exam Constitutional:      General: She is not in acute distress.    Appearance: Normal appearance. She is well-developed, well-groomed and normal weight. She is not ill-appearing.  HENT:     Nose: No congestion or rhinorrhea.     Mouth/Throat:     Pharynx: No pharyngeal swelling, oropharyngeal exudate or posterior oropharyngeal erythema.     Tonsils: No tonsillar exudate.  Neck:     Thyroid: No thyroid mass.  Abdominal:     Tenderness: There is no abdominal tenderness.  Musculoskeletal:     Lumbar back: Tenderness (right lower sciatic) present.     Comments: No flank pain on percussion/palpation   Lymphadenopathy:     Cervical:     Right cervical: No superficial cervical adenopathy.    Left cervical: No superficial cervical adenopathy.  Neurological:     General: No focal deficit present.     Mental  Status: She is alert and oriented to person, place, and time.  Psychiatric:        Mood and Affect: Mood normal.        Behavior: Behavior normal.        Thought Content: Thought content normal.        Judgment: Judgment normal.    BP 130/82    Pulse 82    Temp (!) 97 F (36.1 C) (Temporal)    Ht 5' (1.524 m)    Wt 149 lb (67.6 kg)  SpO2 95%    BMI 29.10 kg/m  Wt Readings from Last 3 Encounters:  07/18/21 149 lb (67.6 kg)  05/04/21 158 lb (71.7 kg)  04/20/21 154 lb (69.9 kg)     Health Maintenance Due  Topic Date Due   TETANUS/TDAP  Never done   Zoster Vaccines- Shingrix (1 of 2) Never done   COVID-19 Vaccine (3 - Moderna risk series) 05/01/2020    There are no preventive care reminders to display for this patient.  Lab Results  Component Value Date   TSH 2.809 05/10/2020   Lab Results  Component Value Date   WBC 5.5 04/12/2021   HGB 13.4 04/12/2021   HCT 39.7 04/12/2021   MCV 92.0 04/12/2021   PLT 191.0 04/12/2021   Lab Results  Component Value Date   NA 138 04/12/2021   K 4.3 04/12/2021   CO2 29 04/12/2021   GLUCOSE 78 04/12/2021   BUN 15 04/12/2021   CREATININE 0.86 04/12/2021   BILITOT 0.5 04/12/2021   ALKPHOS 82 04/12/2021   AST 21 04/12/2021   ALT 17 04/12/2021   PROT 6.5 04/12/2021   ALBUMIN 4.2 04/12/2021   CALCIUM 9.2 04/12/2021   ANIONGAP 7 05/26/2020   GFR 66.24 04/12/2021   No results found for: HGBA1C    Assessment & Plan:   Problem List Items Addressed This Visit       Genitourinary   Acute cystitis without hematuria    Choosing to treat for uti based off of symptoms.  Pt advised of the following:  You were found to have a urinary tract infection, you have been prescribed an antibiotic to your preferred pharmacy. Please start antibiotic today as directed.   We are sending your urine for a culture to make sure you do not have a resistant bacteria. We will call you if we need to change your medications.   Please make sure you are  drinking plenty of fluids over the next few days.  If your symptoms do not improve over the next 5-7 days, or if they worsen, please let us know. Please also let us know if you have worsening back pain, fevers, chills, or body aches.            Other   Acute bilateral low back pain without sciatica    Treated when had sciatica last week with orthopedist, much relief with steroid pack. Pt to continue taking. Less likely kidney at this time considering location/ however pt to let me know if intermittent stabbing more mod/severe pain and will order CT abd pelvis with kidney protocol.       Dysuria - Primary    Urine dipstick in office today, urine/culture sent and pending. Choosing to treat today in office with cipro 500 mg po bid x 7 days. Will order ct abd/pelvis kidney protocol if pain comes back/sharp stabbing lower back to r/o nephrolithiasis.      Relevant Medications   ciprofloxacin (CIPRO) 500 MG tablet   Other Relevant Orders   POCT Urinalysis Dipstick    Meds ordered this encounter  Medications   ciprofloxacin (CIPRO) 500 MG tablet    Sig: Take 1 tablet (500 mg total) by mouth 2 (two) times daily for 7 days.    Dispense:  14 tablet    Refill:  0    Order Specific Question:   Supervising Provider    Answer:   BEDSOLE, AMY E [[id number]]    Follow-up: Return if symptoms worsen or fail to improve.  Eugenia Pancoast, FNP

## 2021-07-18 NOTE — Telephone Encounter (Signed)
Patient called in need some medication for UTI, urine cloudy and having pain,setup appointment for today to see [facility]

## 2021-07-20 ENCOUNTER — Other Ambulatory Visit: Payer: Self-pay

## 2021-07-20 ENCOUNTER — Encounter: Payer: Self-pay | Admitting: Adult Health

## 2021-07-20 ENCOUNTER — Ambulatory Visit (INDEPENDENT_AMBULATORY_CARE_PROVIDER_SITE_OTHER): Payer: Medicare Other | Admitting: Adult Health

## 2021-07-20 ENCOUNTER — Ambulatory Visit
Admission: RE | Admit: 2021-07-20 | Discharge: 2021-07-20 | Disposition: A | Discharge status: Home / Self Care | Payer: Medicare Other | Source: Ambulatory Visit | Attending: Adult Health | Admitting: Adult Health

## 2021-07-20 VITALS — BP 122/70 | HR 79 | Temp 98.0°F | Resp 12 | Ht 60.0 in | Wt 150.0 lb

## 2021-07-20 DIAGNOSIS — R300 Dysuria: Secondary | ICD-10-CM | POA: Diagnosis not present

## 2021-07-20 DIAGNOSIS — R1031 Right lower quadrant pain: Secondary | ICD-10-CM | POA: Insufficient documentation

## 2021-07-20 DIAGNOSIS — R109 Unspecified abdominal pain: Secondary | ICD-10-CM | POA: Diagnosis not present

## 2021-07-20 DIAGNOSIS — R1011 Right upper quadrant pain: Secondary | ICD-10-CM

## 2021-07-20 DIAGNOSIS — Z9049 Acquired absence of other specified parts of digestive tract: Secondary | ICD-10-CM | POA: Insufficient documentation

## 2021-07-20 DIAGNOSIS — I700 Atherosclerosis of aorta: Secondary | ICD-10-CM | POA: Diagnosis not present

## 2021-07-20 NOTE — Progress Notes (Signed)
No urinary tract renal stones seen.  Bladder appears normal.  Scattered diverticulosis of the distal colon without diverticulitis. She had right lower quadrant tenderness.  Patient reported to me she has had appendix out, however CT scan notes normal appearance.  Aortic atherosclerosis, diet and healthy lifestyle and risk reduction, cholesterol screening and control for cardiovascular disease.   Lets wait on labs and urine results. Continue on Cipro. Seek care if worsening at anytime.

## 2021-07-20 NOTE — Patient Instructions (Addendum)
Abdominal Pain, Adult Pain in the abdomen (abdominal pain) can be caused by many things. Often, abdominal pain is not serious and it gets better with no treatment or by being treated at home. However, sometimes abdominal pain is serious. Your health care provider will ask questions about your medical history and do a physical exam to try to determine the cause of your abdominal pain. Follow these instructions at home: Medicines Take over-the-counter and prescription medicines only as told by your health care provider. Do not take a laxative unless told by your health care provider. General instructions  Watch your condition for any changes. Drink enough fluid to keep your urine pale yellow. Keep all follow-up visits as told by your health care provider. This is important. Contact a health care provider if: Your abdominal pain changes or gets worse. You are not hungry or you lose weight without trying. You are constipated or have diarrhea for more than 2-3 days. You have pain when you urinate or have a bowel movement. Your abdominal pain wakes you up at night. Your pain gets worse with meals, after eating, or with certain foods. You are vomiting and cannot keep anything down. You have a fever. You have blood in your urine. Get help right away if: Your pain does not go away as soon as your health care provider told you to expect. You cannot stop vomiting. Your pain is only in areas of the abdomen, such as the right side or the left lower portion of the abdomen. Pain on the right side could be caused by appendicitis. You have bloody or black stools, or stools that look like tar. You have severe pain, cramping, or bloating in your abdomen. You have signs of dehydration, such as: Dark urine, very little urine, or no urine. Cracked lips. Dry mouth. Sunken eyes. Sleepiness. Weakness. You have trouble breathing or chest pain. Summary Often, abdominal pain is not serious and it gets  better with no treatment or by being treated at home. However, sometimes abdominal pain is serious. Watch your condition for any changes. Take over-the-counter and prescription medicines only as told by your health care provider. Contact a health care provider if your abdominal pain changes or gets worse. Get help right away if you have severe pain, cramping, or bloating in your abdomen. This information is not intended to replace advice given to you by your health care provider. Make sure you discuss any questions you have with your health care provider. Document Revised: 07/25/2019 Document Reviewed: 10/14/2018 Elsevier Patient Education  [place]. Urinary Frequency, Adult Urinary frequency means urinating more often than usual. You may urinate every 1-2 hours even though you drink a normal amount of fluid and do not have a bladder infection or condition. Although you urinate more often than normal, the total amount of urine produced in a day is normal. With urinary frequency, you may have an urgent need to urinate often. The stress and anxiety of needing to find a bathroom quickly can make this urge worse. This condition may go away on its own, or you may need treatment at home. Home treatment may include bladder training, exercises, taking medicines, or making changes to your diet. Follow these instructions at home: Bladder health Your health care provider will tell you what to do to improve bladder health. You may be told to: Keep a bladder diary. Keep track of: What you eat and drink. How often you urinate. How much you urinate. Follow a bladder training program.  This may include: Learning to delay going to the bathroom. Double urinating, also called voiding. This helps if you are not completely emptying your bladder. Scheduled voiding. Do Kegel exercises. Kegel exercises strengthen the muscles that help control urination, which may help the condition.  Eating and  drinking Follow instructions from your health care provider about eating or drinking restrictions. You may be told to: Avoid caffeine. Drink fewer fluids, especially alcohol. Avoid drinking in the evening. Avoid foods or drinks that may irritate the bladder. These include coffee, tea, soda, artificial sweeteners, citrus, tomato-based foods, and chocolate. Eat foods that help prevent or treat constipation. Constipation can make urinary frequency worse. You may need to take these actions to prevent or treat constipation: Drink enough fluid to keep your urine pale yellow. Take over-the-counter or prescription medicines. Eat foods that are high in fiber, such as beans, whole grains, and fresh fruits and vegetables. Limit foods that are high in fat and processed sugars, such as fried or sweet foods. General instructions Take over-the-counter and prescription medicines only as told by your health care provider. Keep all follow-up visits. This is important. Contact a health care provider if: You start urinating more often. You feel pain or irritation when you urinate. You notice blood in your urine. Your urine looks cloudy. You develop a fever. You begin vomiting. Get help right away if: You are unable to urinate. Summary Urinary frequency means urinating more often than usual. With urinary frequency, you may urinate every 1-2 hours even though you drink a normal amount of fluid and do not have a bladder infection or other bladder condition. Your health care provider may recommend that you keep a bladder diary, follow a bladder training program, or make dietary changes. If told by your health care provider, do Kegel exercises to strengthen the muscles that help control urination. Take over-the-counter and prescription medicines only as told by your health care provider. Contact a health care provider if your symptoms do not improve or get worse. This information is not intended to replace  advice given to you by your health care provider. Make sure you discuss any questions you have with your health care provider. Document Revised: 01/09/2020 Document Reviewed: 01/09/2020 Elsevier Patient Education  [place].

## 2021-07-20 NOTE — Progress Notes (Addendum)
Acute Office Visit  Subjective:    Patient ID: Melissa Bonilla, female    DOB: February 02, 1946, 76 y.o.   MRN: [id number]  Chief Complaint  Patient presents with   Acute Visit    Discuss R lower groin pain onset 1 wk ago. Saw Dr.Wolff orthopedist and was prescribed prednisone for 12 days, pt states pain started after finishing medication. Pain comes/goes.    HPI Patient is in today for she reports she has been having back pain lumbar area - she was told mechanical back pain. She took prednisone he have her Melissa Peper MD orthopedics.   She is having dysuria.  She has had urinary frequency and pressure. She saw Eugenia Pancoast NP on 07/18/21 for 3 days of dysuria. She was given Cipro on this visit. No leukocytes on urine and small blood when wiping after urinating.   Her pain is only on her right side  lower side and flank.  She denies any urinary hesitancy but does endorse frequency. She has had no urgency or decreased stream, no history of kidney infections.   Denies any black or tarry stools, no rectal bleeding.  Feels well.   Patient  denies any fever, body aches,chills, rash, chest pain, shortness of breath, nausea, vomiting, or diarrhea.  Denies dizziness, lightheadedness, pre syncopal or syncopal episodes.   Past Surgical History:  Procedure Laterality Date   APPENDECTOMY  1972   ATRIAL FIBRILLATION ABLATION N/A 04/09/2020   Procedure: ATRIAL FIBRILLATION ABLATION;  Surgeon: Vickie Epley, MD;  Location: [facility];  Service: Cardiovascular;  Laterality: N/A;   CARDIOVERSION N/A 11/28/2018   Procedure: CARDIOVERSION;  Surgeon: [name], MD;  Location: [facility];  Service: Cardiovascular;  Laterality: N/A;   CARDIOVERSION N/A 12/23/2018   Procedure: CARDIOVERSION;  Surgeon: [name], MD;  Location: [facility];  Service: Cardiovascular;  Laterality: N/A;   CARDIOVERSION N/A 09/30/2019   Procedure: CARDIOVERSION;  Surgeon: Nelva Bush, MD;  Location: [facility];  Service: Cardiovascular;  Laterality: N/A;   CESAREAN SECTION     [id number], 1972   TEE WITHOUT CARDIOVERSION N/A 11/28/2018   Procedure: TRANSESOPHAGEAL ECHOCARDIOGRAM (TEE);  Surgeon: [name], MD;  Location: [facility];  Service: Cardiovascular;  Laterality: N/A;      Past Medical History:  Diagnosis Date   Arrhythmia    atrial fibrillation   CHF (congestive heart failure) ([place])    Depression    Hyperlipidemia    Hypertension    Menopausal syndrome (hot flashes) 1992   used HRT fo 2 yrs    Obstructive sleep apnea    Persistent atrial fibrillation [facility])     Past Surgical History:  Procedure Laterality Date   APPENDECTOMY  1972   ATRIAL FIBRILLATION ABLATION N/A 04/09/2020   Procedure: ATRIAL FIBRILLATION ABLATION;  Surgeon: Vickie Epley, MD;  Location: [facility];  Service: Cardiovascular;  Laterality: N/A;   CARDIOVERSION N/A 11/28/2018   Procedure: CARDIOVERSION;  Surgeon: [name], MD;  Location: [facility];  Service: Cardiovascular;  Laterality: N/A;   CARDIOVERSION N/A 12/23/2018   Procedure: CARDIOVERSION;  Surgeon: [name], MD;  Location: [facility];  Service: Cardiovascular;  Laterality: N/A;   CARDIOVERSION N/A 09/30/2019   Procedure: CARDIOVERSION;  Surgeon: Nelva Bush, MD;  Location: [facility];  Service: Cardiovascular;  Laterality: N/A;   CESAREAN SECTION     [id number], 1972   TEE WITHOUT CARDIOVERSION N/A 11/28/2018   Procedure: TRANSESOPHAGEAL ECHOCARDIOGRAM (TEE);  Surgeon: Fletcher Anon,  Mertie Clause, MD;  Location: [facility];  Service: Cardiovascular;  Laterality: N/A;    Family History  Problem Relation Age of Onset   Arthritis Mother        Rheumatoid   Heart disease Mother        Valvular   Hypertension Mother    Heart disease Father 13       CABG   Arthritis Maternal Grandmother        Rheumatoid   Hypertension Maternal Grandmother    Stroke Maternal Grandmother    Breast cancer Paternal Aunt 48   Breast cancer  Paternal Aunt 24    Social History   Socioeconomic History   Marital status: Married    Spouse name: Not on file   Number of children: Not on file   Years of education: Not on file   Highest education level: Not on file  Occupational History    Employer: self    Comment: Owner of a cleaning Business  Tobacco Use   Smoking status: Former    Packs/day: 0.25    Years: 15.00    Pack years: 3.75    Types: Cigarettes    Quit date: 1992    Years since quitting: 31.1   Smokeless tobacco: Never  Vaping Use   Vaping Use: Never used  Substance and Sexual Activity   Alcohol use: Not Currently    Comment: 1/2 glass of wine on occassion   Drug use: No   Sexual activity: Not on file  Other Topics Concern   Not on file  Social History Narrative   Not on file   Social Determinants of Health   Financial Resource Strain: Not on file  Food Insecurity: Not on file  Transportation Needs: Not on file  Physical Activity: Not on file  Stress: Not on file  Social Connections: Not on file  Intimate Partner Violence: Not on file    Outpatient Medications Prior to Visit  Medication Sig Dispense Refill   ascorbic acid (VITAMIN C) 500 MG tablet Take 500 mg by mouth daily.     cholecalciferol (VITAMIN D) 25 MCG (1000 UNIT) tablet Take 1,000 Units by mouth daily.     ciprofloxacin (CIPRO) 500 MG tablet Take 1 tablet (500 mg total) by mouth 2 (two) times daily for 7 days. 14 tablet 0   ELIQUIS 5 MG TABS tablet TAKE 1 TABLET(5 MG) BY MOUTH TWICE DAILY 180 tablet 1   escitalopram (LEXAPRO) 10 MG tablet TAKE 1 TABLET (10 MG TOTAL) BY MOUTH DAILY. 90 tablet 1   losartan (COZAAR) 25 MG tablet TAKE 1/2 TABLET(12.5 MG) BY MOUTH DAILY 15 tablet 5   Multiple Vitamin (MULTIVITAMIN WITH MINERALS) TABS tablet Take 1 tablet by mouth daily.     rosuvastatin (CRESTOR) 10 MG tablet Take 1 tablet (10 mg total) by mouth 2 (two) times a week. (Patient not taking: Reported on 07/18/2021) 24 tablet 3   No  facility-administered medications prior to visit.    Allergies  Allergen Reactions   Codeine Nausea Only and Rash   Morphine Nausea And Vomiting    Pt allergy may be codeine as codeine or morphine were combined on previous health history. Pt reported upset stomach on questionnaire.   Sulfa Antibiotics Nausea And Vomiting    Review of Systems  Constitutional: Negative.   HENT: Negative.    Eyes: Negative.   Respiratory: Negative.    Cardiovascular: Negative.   Gastrointestinal:  Positive for abdominal pain. Negative for diarrhea, nausea and  rectal pain.  Genitourinary:  Positive for frequency. Negative for decreased urine volume, difficulty urinating, dyspareunia, dysuria, enuresis, vaginal bleeding, vaginal discharge and vaginal pain.  Musculoskeletal: Negative.   Skin: Negative.  Negative for rash.  Neurological: Negative.   Psychiatric/Behavioral: Negative.        Objective:    Physical Exam  BP 122/70 (BP Location: Left Arm, Patient Position: Sitting, Cuff Size: Large)    Pulse 79    Temp 98 F (36.7 C) (Oral)    Resp 12    Ht 5' (1.524 m)    Wt 150 lb (68 kg)    SpO2 98%    BMI 29.29 kg/m  Wt Readings from Last 3 Encounters:  07/20/21 150 lb (68 kg)  07/18/21 149 lb (67.6 kg)  05/04/21 158 lb (71.7 kg)    General: Appearance:     Well developed, well nourished female in no acute distress  Eyes:    PERRL, conjunctiva/corneas clear, EOM's intact       Lungs:     Clear to auscultation bilaterally, respirations unlabored  Heart:    Normal heart rate. Regular rhythm. No murmurs, rubs, or gallops.   MS:   All extremities are intact.    Neurologic:   Awake, alert, oriented x 3. No apparent focal neurological           defect.    Abdomen normal to inspection. Bowel sounds normoactive throughout all quadrants. No gross deformity. No rash. No ecchymosis. Normoactive bowel sounds active all four quadrants. No pulsatile aorta or mass visualized. Tenderness with deep palpation of  RLQ abdomen and no other diffuse tenderness with palpation. No guarding or facial grimace.  No rigidity. No rebound tenderness. No palpable mass. No organomegaly. No CVA tenderness with percussion. No CVA tenderness bilaterally.   Health Maintenance Due  Topic Date Due   TETANUS/TDAP  Never done   Zoster Vaccines- Shingrix (1 of 2) Never done   COVID-19 Vaccine (3 - Moderna risk series) 05/01/2020    There are no preventive care reminders to display for this patient.   Lab Results  Component Value Date   TSH 2.809 05/10/2020   Lab Results  Component Value Date   WBC 7.0 07/20/2021   HGB 13.9 07/20/2021   HCT 40.7 07/20/2021   MCV 91.7 07/20/2021   PLT 204.0 07/20/2021   Lab Results  Component Value Date   NA 138 07/20/2021   K 3.9 07/20/2021   CO2 30 07/20/2021   GLUCOSE 91 07/20/2021   BUN 23 07/20/2021   CREATININE 0.96 07/20/2021   BILITOT 0.4 07/20/2021   ALKPHOS 81 07/20/2021   AST 24 07/20/2021   ALT 21 07/20/2021   PROT 6.6 07/20/2021   ALBUMIN 4.2 07/20/2021   CALCIUM 9.2 07/20/2021   ANIONGAP 7 05/26/2020   GFR 57.94 (L) 07/20/2021   Lab Results  Component Value Date   CHOL 239 (H) 04/12/2021   Lab Results  Component Value Date   HDL 51.40 04/12/2021   Lab Results  Component Value Date   LDLCALC 160 (H) 04/12/2021   Lab Results  Component Value Date   TRIG 136.0 04/12/2021   Lab Results  Component Value Date   CHOLHDL 5 04/12/2021   No results found for: HGBA1C     Assessment & Plan:   Problem List Items Addressed This Visit       Other   Dysuria - Primary   Relevant Orders   Urine Microscopic Only (Completed)   Urine  Culture (Completed)   CT Abdomen Pelvis Wo Contrast (Completed)   RLQ abdominal pain   Relevant Orders   CBC with Differential/Platelet (Completed)   Comprehensive metabolic panel (Completed)   CT Abdomen Pelvis Wo Contrast (Completed)   Other Visit Diagnoses     Right lower quadrant abdominal pain        Relevant Orders   Fecal occult blood, imunochemical   CT Abdomen Pelvis Wo Contrast (Completed)     Continue Cipro she is currently on  prescribed by previous NP seen for leukocytes in microscopic urine, she did not have a urine culture. Will continue given symptoms, recheck urinalysis and culture today which likely will be inaccurate give on antibiotics already however will be prudent in case bacteria not sensitive to Cipro. Increase hydration. Yogurt 1-2 weeks after antibiotic and while taking.  Discussed side effect profile.  CT abdomen rule out kidney stone, diverticulitis, infection and she reports she has had a previous appendectomy unsure what year.  Red Flags discussed. The patient was given clear instructions to go to ER or return to medical center if any red flags develop, symptoms do not improve, worsen or new problems develop. They verbalized understanding.  Return in about 1 week (around 07/27/2021), or if symptoms worsen or fail to improve, for at any time for any worsening symptoms, Go to Emergency room/ urgent care if worse.   No orders of the defined types were placed in this encounter.    Marcille Buffy, FNP

## 2021-07-21 LAB — CBC WITH DIFFERENTIAL/PLATELET
Basophils Absolute: 0.1 10*3/uL (ref 0.0–0.1)
Basophils Relative: 1.3 % (ref 0.0–3.0)
Eosinophils Absolute: 0.2 10*3/uL (ref 0.0–0.7)
Eosinophils Relative: 3.1 % (ref 0.0–5.0)
HCT: 40.7 % (ref 36.0–46.0)
Hemoglobin: 13.9 g/dL (ref 12.0–15.0)
Lymphocytes Relative: 35.8 % (ref 12.0–46.0)
Lymphs Abs: 2.5 10*3/uL (ref 0.7–4.0)
MCHC: 34.2 g/dL (ref 30.0–36.0)
MCV: 91.7 fl (ref 78.0–100.0)
Monocytes Absolute: 0.7 10*3/uL (ref 0.1–1.0)
Monocytes Relative: 10.6 % (ref 3.0–12.0)
Neutro Abs: 3.4 10*3/uL (ref 1.4–7.7)
Neutrophils Relative %: 49.2 % (ref 43.0–77.0)
Platelets: 204 10*3/uL (ref 150.0–400.0)
RBC: 4.44 Mil/uL (ref 3.87–5.11)
RDW: 12.5 % (ref 11.5–15.5)
WBC: 7 10*3/uL (ref 4.0–10.5)

## 2021-07-21 LAB — COMPREHENSIVE METABOLIC PANEL
ALT: 21 U/L (ref 0–35)
AST: 24 U/L (ref 0–37)
Albumin: 4.2 g/dL (ref 3.5–5.2)
Alkaline Phosphatase: 81 U/L (ref 39–117)
BUN: 23 mg/dL (ref 6–23)
CO2: 30 mEq/L (ref 19–32)
Calcium: 9.2 mg/dL (ref 8.4–10.5)
Chloride: 101 mEq/L (ref 96–112)
Creatinine, Ser: 0.96 mg/dL (ref 0.40–1.20)
GFR: 57.94 mL/min — ABNORMAL LOW (ref >60.00)
Glucose, Bld: 91 mg/dL (ref 70–99)
Potassium: 3.9 mEq/L (ref 3.5–5.1)
Sodium: 138 mEq/L (ref 135–145)
Total Bilirubin: 0.4 mg/dL (ref 0.2–1.2)
Total Protein: 6.6 g/dL (ref 6.0–8.3)

## 2021-07-21 LAB — URINALYSIS, MICROSCOPIC ONLY: RBC / HPF: NONE SEEN (ref 0–?)

## 2021-07-21 LAB — URINE CULTURE
MICRO NUMBER:: 12949186
Result:: NO GROWTH
SPECIMEN QUALITY:: ADEQUATE

## 2021-07-21 NOTE — Progress Notes (Signed)
No infection in urine now she was already on Cipro at time of recheck so I would continue if symptoms resolving.

## 2021-07-21 NOTE — Progress Notes (Signed)
Urinalysis is within normal.  CBC is within normal limits.  CMP slightly decreased kidney function GFR, be sure she is drinking and staying hydrated.  Urine culture is pending, and fecal blood test.  Keep follow up.  How is she feeling today ?

## 2021-07-23 DIAGNOSIS — R1031 Right lower quadrant pain: Secondary | ICD-10-CM | POA: Insufficient documentation

## 2021-07-23 DIAGNOSIS — R1011 Right upper quadrant pain: Secondary | ICD-10-CM | POA: Insufficient documentation

## 2021-07-24 ENCOUNTER — Encounter: Payer: Self-pay | Admitting: Adult Health

## 2021-07-25 DIAGNOSIS — M25551 Pain in right hip: Secondary | ICD-10-CM | POA: Diagnosis not present

## 2021-07-25 DIAGNOSIS — M545 Low back pain, unspecified: Secondary | ICD-10-CM | POA: Diagnosis not present

## 2021-07-25 DIAGNOSIS — M5459 Other low back pain: Secondary | ICD-10-CM | POA: Diagnosis not present

## 2021-07-26 ENCOUNTER — Other Ambulatory Visit: Payer: Self-pay | Admitting: Physician Assistant

## 2021-07-26 ENCOUNTER — Other Ambulatory Visit (HOSPITAL_COMMUNITY): Payer: Self-pay | Admitting: Physician Assistant

## 2021-07-26 DIAGNOSIS — M5459 Other low back pain: Secondary | ICD-10-CM

## 2021-07-27 ENCOUNTER — Ambulatory Visit: Payer: Medicare Other | Admitting: Adult Health

## 2021-07-31 DIAGNOSIS — G4733 Obstructive sleep apnea (adult) (pediatric): Secondary | ICD-10-CM | POA: Diagnosis not present

## 2021-08-03 ENCOUNTER — Ambulatory Visit
Admission: RE | Admit: 2021-08-03 | Discharge: 2021-08-03 | Disposition: A | Discharge status: Home / Self Care | Payer: Medicare Other | Source: Ambulatory Visit | Attending: Physician Assistant | Admitting: Physician Assistant

## 2021-08-03 ENCOUNTER — Other Ambulatory Visit: Payer: Self-pay

## 2021-08-03 DIAGNOSIS — M5459 Other low back pain: Secondary | ICD-10-CM | POA: Diagnosis not present

## 2021-08-03 DIAGNOSIS — S32039A Unspecified fracture of third lumbar vertebra, initial encounter for closed fracture: Secondary | ICD-10-CM | POA: Diagnosis not present

## 2021-08-08 ENCOUNTER — Other Ambulatory Visit: Payer: Medicare Other

## 2021-08-10 DIAGNOSIS — M5442 Lumbago with sciatica, left side: Secondary | ICD-10-CM | POA: Diagnosis not present

## 2021-08-10 DIAGNOSIS — G8929 Other chronic pain: Secondary | ICD-10-CM | POA: Diagnosis not present

## 2021-08-10 DIAGNOSIS — M25551 Pain in right hip: Secondary | ICD-10-CM | POA: Diagnosis not present

## 2021-08-10 DIAGNOSIS — M5441 Lumbago with sciatica, right side: Secondary | ICD-10-CM | POA: Diagnosis not present

## 2021-08-18 DIAGNOSIS — M25551 Pain in right hip: Secondary | ICD-10-CM | POA: Diagnosis not present

## 2021-08-18 DIAGNOSIS — G8929 Other chronic pain: Secondary | ICD-10-CM | POA: Diagnosis not present

## 2021-08-28 DIAGNOSIS — G4733 Obstructive sleep apnea (adult) (pediatric): Secondary | ICD-10-CM | POA: Diagnosis not present

## 2021-09-01 DIAGNOSIS — G4733 Obstructive sleep apnea (adult) (pediatric): Secondary | ICD-10-CM | POA: Diagnosis not present

## 2021-09-06 DIAGNOSIS — M5441 Lumbago with sciatica, right side: Secondary | ICD-10-CM | POA: Diagnosis not present

## 2021-09-06 DIAGNOSIS — M25551 Pain in right hip: Secondary | ICD-10-CM | POA: Diagnosis not present

## 2021-09-06 DIAGNOSIS — M5442 Lumbago with sciatica, left side: Secondary | ICD-10-CM | POA: Diagnosis not present

## 2021-09-06 DIAGNOSIS — G8929 Other chronic pain: Secondary | ICD-10-CM | POA: Diagnosis not present

## 2021-09-08 ENCOUNTER — Other Ambulatory Visit: Payer: Self-pay | Admitting: Adult Health

## 2021-09-08 DIAGNOSIS — E785 Hyperlipidemia, unspecified: Secondary | ICD-10-CM

## 2021-09-08 DIAGNOSIS — R1031 Right lower quadrant pain: Secondary | ICD-10-CM

## 2021-09-20 ENCOUNTER — Encounter: Payer: Self-pay | Admitting: Emergency Medicine

## 2021-09-20 ENCOUNTER — Telehealth: Payer: Self-pay

## 2021-09-20 ENCOUNTER — Other Ambulatory Visit: Payer: Self-pay

## 2021-09-20 ENCOUNTER — Ambulatory Visit (INDEPENDENT_AMBULATORY_CARE_PROVIDER_SITE_OTHER): Payer: Medicare Other | Admitting: Internal Medicine

## 2021-09-20 ENCOUNTER — Telehealth: Payer: Self-pay | Admitting: Internal Medicine

## 2021-09-20 ENCOUNTER — Encounter: Payer: Self-pay | Admitting: Internal Medicine

## 2021-09-20 ENCOUNTER — Emergency Department
Admission: EM | Admit: 2021-09-20 | Discharge: 2021-09-20 | Disposition: A | Discharge status: Home / Self Care | Payer: Medicare Other | Attending: Emergency Medicine | Admitting: Emergency Medicine

## 2021-09-20 VITALS — BP 110/86 | HR 150 | Temp 98.3°F | Ht 60.0 in | Wt 149.0 lb

## 2021-09-20 DIAGNOSIS — I483 Typical atrial flutter: Secondary | ICD-10-CM | POA: Insufficient documentation

## 2021-09-20 DIAGNOSIS — I110 Hypertensive heart disease with heart failure: Secondary | ICD-10-CM | POA: Diagnosis not present

## 2021-09-20 DIAGNOSIS — I509 Heart failure, unspecified: Secondary | ICD-10-CM | POA: Diagnosis not present

## 2021-09-20 DIAGNOSIS — B029 Zoster without complications: Secondary | ICD-10-CM | POA: Insufficient documentation

## 2021-09-20 DIAGNOSIS — R002 Palpitations: Secondary | ICD-10-CM | POA: Diagnosis present

## 2021-09-20 DIAGNOSIS — R000 Tachycardia, unspecified: Secondary | ICD-10-CM

## 2021-09-20 DIAGNOSIS — I2510 Atherosclerotic heart disease of native coronary artery without angina pectoris: Secondary | ICD-10-CM | POA: Diagnosis not present

## 2021-09-20 DIAGNOSIS — I4892 Unspecified atrial flutter: Secondary | ICD-10-CM | POA: Diagnosis not present

## 2021-09-20 HISTORY — DX: Zoster without complications: B02.9

## 2021-09-20 LAB — COMPREHENSIVE METABOLIC PANEL
ALT: 24 U/L (ref 0–44)
AST: 25 U/L (ref 15–41)
Albumin: 4 g/dL (ref 3.5–5.0)
Alkaline Phosphatase: 105 U/L (ref 38–126)
Anion gap: 10 (ref 5–15)
BUN: 17 mg/dL (ref 8–23)
CO2: 24 mmol/L (ref 22–32)
Calcium: 9.2 mg/dL (ref 8.9–10.3)
Chloride: 104 mmol/L (ref 98–111)
Creatinine, Ser: 0.9 mg/dL (ref 0.44–1.00)
GFR, Estimated: >60 mL/min (ref >60)
Glucose, Bld: 94 mg/dL (ref 70–99)
Potassium: 4.1 mmol/L (ref 3.5–5.1)
Sodium: 138 mmol/L (ref 135–145)
Total Bilirubin: 0.7 mg/dL (ref 0.3–1.2)
Total Protein: 7.2 g/dL (ref 6.5–8.1)

## 2021-09-20 LAB — CBC WITH DIFFERENTIAL/PLATELET
Abs Immature Granulocytes: 0.03 10*3/uL (ref 0.00–0.07)
Basophils Absolute: 0.1 10*3/uL (ref 0.0–0.1)
Basophils Relative: 1 %
Eosinophils Absolute: 0.2 10*3/uL (ref 0.0–0.5)
Eosinophils Relative: 2 %
HCT: 44.6 % (ref 36.0–46.0)
Hemoglobin: 14.8 g/dL (ref 12.0–15.0)
Immature Granulocytes: 0 %
Lymphocytes Relative: 42 %
Lymphs Abs: 3.2 10*3/uL (ref 0.7–4.0)
MCH: 30.6 pg (ref 26.0–34.0)
MCHC: 33.2 g/dL (ref 30.0–36.0)
MCV: 92.1 fL (ref 80.0–100.0)
Monocytes Absolute: 0.8 10*3/uL (ref 0.1–1.0)
Monocytes Relative: 10 %
Neutro Abs: 3.4 10*3/uL (ref 1.7–7.7)
Neutrophils Relative %: 45 %
Platelets: 239 10*3/uL (ref 150–400)
RBC: 4.84 MIL/uL (ref 3.87–5.11)
RDW: 12.4 % (ref 11.5–15.5)
WBC: 7.6 10*3/uL (ref 4.0–10.5)
nRBC: 0 % (ref 0.0–0.2)

## 2021-09-20 MED ORDER — METOPROLOL TARTRATE 12.5 MG HALF TABLET: 25.0000 mg | ORAL_TABLET | Freq: Once | ORAL | Status: DC

## 2021-09-20 MED ORDER — DILTIAZEM HCL ER COATED BEADS 180 MG PO CP24: 180.0000 mg | ORAL_CAPSULE | Freq: Every day | ORAL | 0 refills | Status: DC

## 2021-09-20 MED ORDER — TRAMADOL HCL 50 MG PO TABS
50.0000 mg | ORAL_TABLET | Freq: Three times a day (TID) | ORAL | 0 refills | Status: AC | PRN
Start: 2021-09-20 — End: 2021-09-25

## 2021-09-20 MED ORDER — DILTIAZEM HCL ER COATED BEADS 180 MG PO CP24
180.0000 mg | ORAL_CAPSULE | Freq: Once | ORAL | Status: AC
  Administered 2021-09-20: 180 mg via ORAL
  Filled 2021-09-20: qty 1

## 2021-09-20 MED ORDER — DILTIAZEM HCL 25 MG/5ML IV SOLN
15.0000 mg | Freq: Once | INTRAVENOUS | Status: AC
  Administered 2021-09-20: 15 mg via INTRAVENOUS
  Filled 2021-09-20: qty 5

## 2021-09-20 NOTE — Assessment & Plan Note (Signed)
Sending to ER for treatment.  She has received 25 mg metoprolol in office with no change yet. It should be noted that she was using SalonPas patches with lidocaine (4%)  24/7 for the last 2 weeks to manage back pain secondary to L3 fracture  ?

## 2021-09-20 NOTE — ED Triage Notes (Signed)
Pt to ED via POV, pt states that she went to her PCP for issues with her back and they found that her heart rate was high. Pt denies dizziness or shortness of breath. Pt states that her PCP called her cardiologist Dr. Saunders Revel and he asked that she be sent to the ED for evaluation. Pt is in NAD.  ?

## 2021-09-20 NOTE — Progress Notes (Signed)
? ?Subjective:  ?Patient ID: Melissa Bonilla, female    DOB: 1946/02/08  Age: 76 y.o. MRN: [id number] ? ?CC: The primary encounter diagnosis was Tachycardia. Diagnoses of Atrial flutter, unspecified type ([place]) and Herpes zoster without complication were also pertinent to this visit. ? ? ?This visit occurred during the SARS-CoV-2 public health emergency.  Safety protocols were in place, including screening questions prior to the visit, additional usage of staff PPE, and extensive cleaning of exam room while observing appropriate contact time as indicated for disinfecting solutions.   ? ?HPI ?Melissa Bonilla presents for discussion of recent DEXA noting osteoporosis,  back pain,  increased anxiety and herpes  ?Chief Complaint  ?Patient presents with  ? Follow-up  ?  Follow up to discuss bone density results and discuss restarting lexapro  ? ?Patient was noted to be tachycardic by RN .  12 lead ekg done  atrial flutter rate 150 , with ST depression in chest leads noted (chronic ST changes) . She has a history of atrial fib,  treated with ablation Oct 2021 . Currently taking Eliquis , has not required rate control. Last EKG NSR Nov 2022 during follow up with Dr End. ? ?She denies SOB ,  has been walking for exercise for the last 3 days  ? ?Has been having severe back pain for the last several months,  seeing Orthopedics  .  Received a steroid injection in right hip,  which helped. Minimally.  A lumbar MRI done and L3 mild fracture was   found.  She was not given any pain medication .  For three weeks was unable to get out of bed .de to severe pain   Lost control of bladder.  Regained control of bladder for the past week.     ? ?  Has been using tylenol excessively  and Salon pas patch with lidocaine with no 12 hour break between patches For the last  2 weeks.  Has felt heart racing at bedtime  only (has noted  an increased pulse in her carotid area)  ? ?Anxiety increased due to mother's decline and increased work of  caregiving . Wants to resume lexapro  ? ?Treated for "herpes breakout" 3 weeks ago  by dermatology  . Has gotten worse since her steroid injection painful and limited to right buttock.   Has not had the  shingles vaccine  ? ?Outpatient Medications Prior to Visit  ?Medication Sig Dispense Refill  ? ascorbic acid (VITAMIN C) 500 MG tablet Take 500 mg by mouth daily.    ? cholecalciferol (VITAMIN D) 25 MCG (1000 UNIT) tablet Take 1,000 Units by mouth daily.    ? Coenzyme Q10 (COQ10) 200 MG CAPS Take 1 capsule by mouth daily.    ? ELIQUIS 5 MG TABS tablet TAKE 1 TABLET(5 MG) BY MOUTH TWICE DAILY 180 tablet 1  ? losartan (COZAAR) 25 MG tablet TAKE 1/2 TABLET(12.5 MG) BY MOUTH DAILY 15 tablet 5  ? Magnesium 400 MG CAPS Take 1 capsule by mouth daily at 2 am.    ? Multiple Vitamin (MULTIVITAMIN WITH MINERALS) TABS tablet Take 1 tablet by mouth daily.    ? rosuvastatin (CRESTOR) 10 MG tablet Take 1 tablet (10 mg total) by mouth 2 (two) times a week. 24 tablet 3  ? escitalopram (LEXAPRO) 10 MG tablet TAKE 1 TABLET (10 MG TOTAL) BY MOUTH DAILY. (Patient not taking: Reported on 09/20/2021) 90 tablet 1  ? ?No facility-administered medications prior to visit.  ? ? ?  Review of Systems; ? ?Patient denies headache, fevers, malaise, unintentional weight loss,  eye pain, sinus congestion and sinus pain, sore throat, dysphagia,  hemoptysis , cough, dyspnea, wheezing, chest pain,  orthopnea, edema, abdominal pain, nausea, melena, diarrhea, constipation, flank pain, dysuria, hematuria, urinary  Frequency, nocturia, numbness, tingling, seizures,  Focal weakness, Loss of consciousness,  Tremor, insomnia, depression, anxiety, and suicidal ideation.   ? ? ? ?Objective:  ?BP 110/86 (BP Location: Left Arm, Patient Position: Sitting, Cuff Size: Normal)   Pulse (!) 150   Temp 98.3 ?F (36.8 ?C) (Oral)   Ht 5' (1.524 m)   Wt 149 lb (67.6 kg)   SpO2 95%   BMI 29.10 kg/m?  ? ?BP Readings from Last 3 Encounters:  ?09/20/21 110/86  ?07/20/21 122/70   ?07/18/21 130/82  ? ? ?Wt Readings from Last 3 Encounters:  ?09/20/21 149 lb (67.6 kg)  ?07/20/21 150 lb (68 kg)  ?07/18/21 149 lb (67.6 kg)  ? ? ?General appearance: alert, cooperative and appears stated age ?Ears: normal TM's and external ear canals both ears ?Throat: lips, mucosa, and tongue normal; teeth and gums normal ?Neck: no adenopathy, no carotid bruit, supple, symmetrical, trachea midline and thyroid not enlarged, symmetric, no tenderness/mass/nodules ?Back: symmetric, no curvature. ROM normal. No CVA tenderness. ?Lungs: clear to auscultation bilaterally ?Heart: tachycardic rate 245, no murmur, click, rub or gallop ?Abdomen: soft, non-tender; bowel sounds normal; no masses,  no organomegaly ?Pulses: 2+ and symmetric ?Skin: Skin color, texture, turgor normal. Right buttock with resolving vesicular rash  ?Lymph nodes: Cervical, supraclavicular, and axillary nodes normal. ? ?No results found for: HGBA1C ? ?Lab Results  ?Component Value Date  ? CREATININE 0.96 07/20/2021  ? CREATININE 0.86 04/12/2021  ? CREATININE 0.85 05/26/2020  ? ? ?Lab Results  ?Component Value Date  ? WBC 7.0 07/20/2021  ? HGB 13.9 07/20/2021  ? HCT 40.7 07/20/2021  ? PLT 204.0 07/20/2021  ? GLUCOSE 91 07/20/2021  ? CHOL 239 (H) 04/12/2021  ? TRIG 136.0 04/12/2021  ? HDL 51.40 04/12/2021  ? LDLDIRECT 175.5 03/19/2013  ? [place] 160 (H) 04/12/2021  ? ALT 21 07/20/2021  ? AST 24 07/20/2021  ? NA 138 07/20/2021  ? K 3.9 07/20/2021  ? CL 101 07/20/2021  ? CREATININE 0.96 07/20/2021  ? BUN 23 07/20/2021  ? CO2 30 07/20/2021  ? TSH 2.809 05/10/2020  ? INR 1.3 (H) 11/26/2018  ? ? ?MR LUMBAR SPINE WO CONTRAST ? ?Result Date: 08/03/2021 ?CLINICAL DATA:  Low back pain 1 month EXAM: MRI LUMBAR SPINE WITHOUT CONTRAST TECHNIQUE: Multiplanar, multisequence MR imaging of the lumbar spine was performed. No intravenous contrast was administered. COMPARISON:  CT abdomen pelvis 07/20/2021 FINDINGS: Segmentation:  5 lumbar vertebra Alignment:  Mild  anterolisthesis L2-3. Vertebrae: Mild fracture of L3. Superior endplate fracture with associated bone marrow edema. Findings compatible with recent fracture. This was present on the recent CT. No other fracture or mass lesion identified. Conus medullaris and cauda equina: Conus extends to the L1-2 level. Conus and cauda equina appear normal. Paraspinal and other soft tissues: Negative for paraspinous mass or adenopathy Disc levels: T12-L1: Disc degeneration with shallow central disc protrusion. Negative for stenosis L1-2: Negative L2-3: Mild anterolisthesis. Disc degeneration with diffuse disc bulging. Shallow left foraminal disc protrusion. Subarticular and foraminal stenosis on the left is mild-to-moderate. L3-4: Mild disc and facet degeneration.  Negative for stenosis L4-5: Mild disc and facet degeneration.  Negative for stenosis L5-S1 negative IMPRESSION: Mild superior endplate fracture of L3 with associated  bone marrow edema. This is consistent with a healing recent fracture. Multilevel degenerative change most prominent on the left at L2-3 with left subarticular and foraminal stenosis due to disc protrusion and spurring. Electronically Signed   By: Franchot Gallo M.D.   On: 08/03/2021 13:10  ? ? ?Assessment & Plan:  ? ?Problem List Items Addressed This Visit   ? ? Shingles rash  ?  Present for 3 weeks on right buttock treated with acycolvir (?) by dermatologist ?  ?  ? Atrial flutter ([place])  ?  Sending to ER for treatment.  She has received 25 mg metoprolol in office with no change yet. It should be noted that she was using SalonPas patches with lidocaine (4%)  24/7 for the last 2 weeks to manage back pain secondary to L3 fracture  ?  ?  ? Relevant Medications  ? metoprolol tartrate (LOPRESSOR) tablet 25 mg (Start on 09/20/2021  4:30 PM)  ? ?Other Visit Diagnoses   ? ? Tachycardia    -  Primary  ? Relevant Medications  ? metoprolol tartrate (LOPRESSOR) tablet 25 mg (Start on 09/20/2021  4:30 PM)  ? Other Relevant  Orders  ? EKG 12-Lead (Completed)  ? ?  ? ? ?I spent 30 minutes dedicated to the care of this patient on the date of this encounter to include pre-visit review of patient's medical history,  most recent  office visits

## 2021-09-20 NOTE — Telephone Encounter (Signed)
Reviewed the patient's chart.  ? ?EKG scanned in show possible atrial flutter with HR's in the 150's. ? ?I am not certain whom scheduling attempted to reach as I am on triage and not been made aware of this call before now. ? ?Dr. Saunders Revel in the office and reviewed the patient with him. ?He advised that Dr. Derrel Nip had been secure chatting him and he advised the patient go to the ER for further evaluation and possible treatment. ?Per Dr. Derrel Nip, the patient is having uncontrolled back pain as well.  ? ?The patient is currently in the ER at the time of this notation.  ?

## 2021-09-20 NOTE — Assessment & Plan Note (Signed)
Present for 3 weeks on right buttock treated with acycolvir (?) by dermatologist ?

## 2021-09-20 NOTE — Telephone Encounter (Signed)
Error

## 2021-09-20 NOTE — Telephone Encounter (Signed)
? ?  Dr. Lupita Dawn office calling because pt is there right now with 150 pulse, tried to reach out with nurse. No response, I went back to the nurse and told that I'm trying to get someone else for her however, after placing her on hold line got disconnected  ?

## 2021-09-20 NOTE — ED Provider Notes (Signed)
? ?[facility] ?Provider Note ? ? ? Event Date/Time  ? First MD Initiated Contact with Patient 09/20/21 1711   ?  (approximate) ? ? ?History  ? ?Palpitations ? ? ?HPI ? ?JOLONDA GOMM is a 76 y.o. female with past medical history of atrial fibrillation, CHF, hypertension hyperlipidemia who presents with palpitations.  Patient had a routine appointment with her primary care provider today and was found to be tachycardic.  Patient does not feel her elevated heart rate.  Denies shortness of breath chest pain nausea vomiting or lower extremity edema.  She is compliant with Eliquis.  Last week missed 2 doses but for the last week has anything.  She follows with [facility] cardiology, had an ablation for atrial fibrillation October 2021. ?  ? ?Past Medical History:  ?Diagnosis Date  ? Arrhythmia   ? atrial fibrillation  ? CHF (congestive heart failure) ([place])   ? Depression   ? Hyperlipidemia   ? Hypertension   ? Menopausal syndrome (hot flashes) 1992  ? used HRT fo 2 yrs   ? Obstructive sleep apnea   ? Persistent atrial fibrillation ([place])   ? ? ?Patient Active Problem List  ? Diagnosis Date Noted  ? Shingles rash 09/20/2021  ? RLQ abdominal pain 07/23/2021  ? Right upper quadrant abdominal pain 07/23/2021  ? History of appendectomy 07/20/2021  ? Dysuria 07/18/2021  ? Acute cystitis without hematuria 07/18/2021  ? Osteoporosis 05/14/2021  ? Coronary artery disease involving native coronary artery of native heart without angina pectoris 04/20/2021  ? Pure hypercholesterolemia 04/20/2021  ? Aortic atherosclerosis ([place]) 04/13/2021  ? Acquired thrombophilia ([place]) 05/10/2020  ? Atrial flutter ([place]) 09/10/2019  ? Essential hypertension 09/10/2019  ? Adjustment reaction with anxiety and depression 01/21/2019  ? Persistent atrial fibrillation ([place]) 12/18/2018  ? Hospital discharge follow-up 12/07/2018  ? Nonischemic cardiomyopathy ([place]) 12/01/2018  ? Obesity 09/02/2015  ? Hyperlipidemia with target LDL less than  100 01/13/2014  ? Bursitis of hip 01/13/2014  ? Acute bilateral low back pain without sciatica 08/18/2012  ? Routine general medical examination at a health care facility 08/18/2012  ? Screening for cervical cancer 05/08/2011  ? Screening for colon cancer 05/08/2011  ? ? ? ?Physical Exam  ?Triage Vital Signs: ?ED Triage Vitals  ?Enc Vitals Group  ?   BP 09/20/21 1648 (!) 129/101  ?   Pulse Rate 09/20/21 1648 (!) 143  ?   Resp 09/20/21 1648 16  ?   Temp 09/20/21 1648 98.5 ?F (36.9 ?C)  ?   Temp Source 09/20/21 1648 Oral  ?   SpO2 09/20/21 1648 94 %  ?   Weight 09/20/21 1645 149 lb (67.6 kg)  ?   Height 09/20/21 1645 5' (1.524 m)  ?   Head Circumference --   ?   Peak Flow --   ?   Pain Score 09/20/21 1645 0  ?   Pain Loc --   ?   Pain Edu? --   ?   Excl. in [place]? --   ? ? ?Most recent vital signs: ?Vitals:  ? 09/20/21 1800 09/20/21 1830  ?BP: 108/67 109/74  ?Pulse: 72 70  ?Resp: 19 19  ?Temp:    ?SpO2: 94% 97%  ? ? ? ?General: Awake, no distress.  ?CV:  Good peripheral perfusion. No edema ?Resp:  Normal effort. Lungs are clear ?Abd:  No distention.  ?Neuro:             Awake, Alert,  Oriented x 3  ?Other:   ? ? ?ED Results / Procedures / Treatments  ?Labs ?(all labs ordered are listed, but only abnormal results are displayed) ?Labs Reviewed  ?COMPREHENSIVE METABOLIC PANEL  ?CBC WITH DIFFERENTIAL/PLATELET  ? ? ? ?EKG ? ?EKG interpreted by myself, atrial flutter with 2-1 conduction, normal axis nonspecific ST depression in the anterior precordial leads ? ? ?RADIOLOGY ? ? ? ?PROCEDURES: ? ?Critical Care performed: Yes, see critical care procedure note(s) ? ?.1-3 Lead EKG Interpretation ?Performed by: Rada Hay, MD ?Authorized by: Rada Hay, MD  ? ?  Interpretation: abnormal   ?  ECG rate assessment: tachycardic   ?  Rhythm: atrial flutter   ?  Ectopy: none   ?  Conduction: normal   ?.Critical Care ?Performed by: Rada Hay, MD ?Authorized by: Rada Hay, MD  ? ?Critical care provider  statement:  ?  Critical care time (minutes):  30 ?  Critical care was time spent personally by me on the following activities:  Development of treatment plan with patient or surrogate, discussions with consultants, evaluation of patient's response to treatment, examination of patient, ordering and review of laboratory studies, ordering and review of radiographic studies, ordering and performing treatments and interventions, pulse oximetry, re-evaluation of patient's condition and review of old charts ? ?The patient is on the cardiac monitor to evaluate for evidence of arrhythmia and/or significant heart rate changes. ? ? ?MEDICATIONS ORDERED IN ED: ?Medications  ?diltiazem (CARDIZEM) injection 15 mg (15 mg Intravenous Given 09/20/21 1746)  ?diltiazem (CARDIZEM CD) 24 hr capsule 180 mg (180 mg Oral Given 09/20/21 1842)  ? ? ? ?IMPRESSION / MDM / ASSESSMENT AND PLAN / ED COURSE  ?I reviewed the triage vital signs and the nursing notes. ?             ?               ? ?Patient is a 76 year old female with past medical history of atrial fibrillation status post ablation October 2021 on Eliquis who presents with tachycardia.  She was at a routine PCP appointment when she was found to have an elevated heart rate.  Patient is otherwise asymptomatic, she is not having shortness of breath chest pain lightheadedness dizziness or lower extremity edema.  Appears quite well on exam.  Heart rate is around 140 with minimal variability and EKG shows likely atrial flutter.  Discussed with patient the option for cardioversion versus rate control and she would prefer medical management at this point.  Will give diltiazem.  If unable to control her rate may need to consider cardioversion.  We will check electrolytes and a CBC. ? ?After 15 mg of Dilt push patient is rate controlled heart rate in the 70s repeat EKG still showing a flutter.  Discussed with Dr. Curt Bears on-call for [facility] cardiology who recommends starting her on 180 mg of Dilt  with clinic follow-up.  She was given a dose of p.o. Dilt in the ED and will send prescription to pharmacy.  Given she is minimally symptomatic and now rate control I think she is appropriate for outpatient management. ? ? ?FINAL CLINICAL IMPRESSION(S) / ED DIAGNOSES  ? ?Final diagnoses:  ?Typical atrial flutter ([place])  ? ? ? ?Rx / DC Orders  ? ?ED Discharge Orders   ? ?      Ordered  ?  diltiazem (CARDIZEM CD) 180 MG 24 hr capsule  Daily       ? 09/20/21  1851  ? ?  ?  ? ?  ? ? ? ?Note:  This document was prepared using Dragon voice recognition software and may include unintentional dictation errors. ?  ?Rada Hay, MD ?09/21/21 0018 ? ?

## 2021-09-20 NOTE — ED Notes (Addendum)
Pt states she was sent from Dr. Lupita Dawn office for her high HR. Pt states she was not there for that, but for her bone density test results. Pt denies any CP, SOB, palpitations. Pt states she has no sx's like she did the last time she had A. Fib with a fast HR. Pt takes Eliquis for A. Fib. ? ?Pt placed on cardiac, BP, pulse ox monitors. HR 140s on monitor. ?

## 2021-09-20 NOTE — ED Notes (Signed)
Secure msg sent to pharmacy requesting they verify & send cardizem CD to ER for this pt. ?

## 2021-09-20 NOTE — Discharge Instructions (Addendum)
You were in a rhythm called atrial flutter today.  Your heart rate is better controlled.  Please start taking the diltiazem once daily.  Please follow-up with Dr. Darnelle Bos office tomorrow. ?

## 2021-09-21 ENCOUNTER — Telehealth: Payer: Self-pay

## 2021-09-21 ENCOUNTER — Telehealth: Payer: Self-pay | Admitting: Cardiology

## 2021-09-21 NOTE — Telephone Encounter (Signed)
Patient was seen in ED yesterday for palpitations Patient c/o Palpitations:  High priority if patient c/o lightheadedness, shortness of breath, or chest pain  How long have you had palpitations/irregular HR/ Afib? Are you having the symptoms now? No symptoms now. She went to her PCP regarding a bone density, and they saw her HR was 150, she then went to the ED  Are you currently experiencing lightheadedness, SOB or CP? No symptoms  Do you have a history of afib (atrial fibrillation) or irregular heart rhythm? yes  Have you checked your BP or HR? (document readings if available):  BP 115/50, HR yesterday was 150, ED got it lowered to 70  Are you experiencing any other symptoms? No other symptoms.    Patient has appt on 4/26

## 2021-09-21 NOTE — Telephone Encounter (Signed)
Returned call to Pt. ? ?Advised that Dr. Quentin Ore would like Pt to be seen at the Afib clinic this week. ? ?Pt scheduled with afib clinic 09/22/21 at 9:30 am. ? ?Pt states she does not drive, but she will attempt to get someone to take her. ? ?Gave Pt direct number to afib clinic to call if she cannot keep this appointment. ? ?Pt thanked  nurse for appt and call. ?

## 2021-09-21 NOTE — Telephone Encounter (Signed)
Spoke with pt who reports current HR between 93-150.  She denies current symptoms and states she only knew her HR was elevated again because she checked her BP.  She is not due to take her Diltiazem until 7pm.  Pt advised to continue to monitor HR, rest and no strenuous activity. Reviewed ED precautions. ?Will forward to Dr Quentin Ore for further review and advisement.  Pt verbalizes understanding and agrees with current plan. ?

## 2021-09-21 NOTE — Telephone Encounter (Signed)
STAT if HR is under 50 or over 120 ?(normal HR is 60-100 beats per minute) ? ?What is your heart rate?  ?150 (currently) ?Do you have a log of your heart rate readings (document readings)?  ? ? ?Do you have any other symptoms?  ?Patient states she is not having any symptoms, but her HR is up to 150. ?

## 2021-09-21 NOTE — Telephone Encounter (Signed)
Spoke to patient and she is doing okay with a normal heart rate today. Reviewed ED note with Dr. Quentin Ore. Advised to continue new medication diltiazem and follow up appt at the end of April with Dr. Quentin Ore. Asked to take VS a few hours after diltiazem taken since she, most of the time, can not tell when her heart rate is fast. Will give [place] I call if it becomes elevated again prior to appointment.  ?Verbalized understanding.  ?

## 2021-09-21 NOTE — Telephone Encounter (Signed)
error 

## 2021-09-22 ENCOUNTER — Encounter (HOSPITAL_COMMUNITY): Payer: Self-pay | Admitting: Physician Assistant

## 2021-09-22 ENCOUNTER — Ambulatory Visit (HOSPITAL_COMMUNITY)
Admission: RE | Admit: 2021-09-22 | Discharge: 2021-09-22 | Disposition: A | Discharge status: Home / Self Care | Payer: Medicare Other | Source: Ambulatory Visit | Attending: Physician Assistant | Admitting: Physician Assistant

## 2021-09-22 VITALS — BP 136/92 | HR 144 | Ht 60.0 in | Wt 148.4 lb

## 2021-09-22 DIAGNOSIS — I428 Other cardiomyopathies: Secondary | ICD-10-CM | POA: Insufficient documentation

## 2021-09-22 DIAGNOSIS — E785 Hyperlipidemia, unspecified: Secondary | ICD-10-CM | POA: Insufficient documentation

## 2021-09-22 DIAGNOSIS — Z9989 Dependence on other enabling machines and devices: Secondary | ICD-10-CM | POA: Diagnosis not present

## 2021-09-22 DIAGNOSIS — Z7901 Long term (current) use of anticoagulants: Secondary | ICD-10-CM | POA: Insufficient documentation

## 2021-09-22 DIAGNOSIS — G4733 Obstructive sleep apnea (adult) (pediatric): Secondary | ICD-10-CM | POA: Insufficient documentation

## 2021-09-22 DIAGNOSIS — I4819 Other persistent atrial fibrillation: Secondary | ICD-10-CM | POA: Diagnosis not present

## 2021-09-22 DIAGNOSIS — I110 Hypertensive heart disease with heart failure: Secondary | ICD-10-CM | POA: Diagnosis not present

## 2021-09-22 DIAGNOSIS — I5022 Chronic systolic (congestive) heart failure: Secondary | ICD-10-CM | POA: Insufficient documentation

## 2021-09-22 DIAGNOSIS — I4892 Unspecified atrial flutter: Secondary | ICD-10-CM | POA: Insufficient documentation

## 2021-09-22 DIAGNOSIS — D6869 Other thrombophilia: Secondary | ICD-10-CM | POA: Diagnosis not present

## 2021-09-22 DIAGNOSIS — Z79899 Other long term (current) drug therapy: Secondary | ICD-10-CM | POA: Insufficient documentation

## 2021-09-22 LAB — BASIC METABOLIC PANEL
Anion gap: 6 (ref 5–15)
BUN: 19 mg/dL (ref 8–23)
CO2: 23 mmol/L (ref 22–32)
Calcium: 9.4 mg/dL (ref 8.9–10.3)
Chloride: 110 mmol/L (ref 98–111)
Creatinine, Ser: 0.89 mg/dL (ref 0.44–1.00)
GFR, Estimated: >60 mL/min (ref >60)
Glucose, Bld: 91 mg/dL (ref 70–99)
Potassium: 5 mmol/L (ref 3.5–5.1)
Sodium: 139 mmol/L (ref 135–145)

## 2021-09-22 LAB — CBC
HCT: 43.6 % (ref 36.0–46.0)
Hemoglobin: 14.6 g/dL (ref 12.0–15.0)
MCH: 31.5 pg (ref 26.0–34.0)
MCHC: 33.5 g/dL (ref 30.0–36.0)
MCV: 94 fL (ref 80.0–100.0)
Platelets: 233 10*3/uL (ref 150–400)
RBC: 4.64 MIL/uL (ref 3.87–5.11)
RDW: 12.3 % (ref 11.5–15.5)
WBC: 6.9 10*3/uL (ref 4.0–10.5)
nRBC: 0 % (ref 0.0–0.2)

## 2021-09-22 MED ORDER — DILTIAZEM HCL ER COATED BEADS 180 MG PO CP24: 180.0000 mg | ORAL_CAPSULE | Freq: Two times a day (BID) | ORAL | 3 refills | Status: DC

## 2021-09-22 NOTE — Progress Notes (Signed)
? ? ?Primary Care Physician: Crecencio Mc, MD ?Primary Cardiologist: Dr End ?Primary Electrophysiologist: Dr Caryl Comes ?Referring Physician: Dr Quentin Ore ? ? ?Melissa Bonilla is a 76 y.o. female with a history of chronic systolic CHF, atrial flutter, persistent atrial fibrillation, HTN, HLD, OSA who presents for follow up in the [facility].  The patient was initially diagnosed with atrial fibrillation 11/2018. She has been maintained on amiodarone. Patient is on Eliquis for a CHADS2VASC score of 4. Patient underwent afib ablation with Dr Quentin Ore on 04/09/20.  ? ?On follow up today, patient was seen by her PCP on 09/20/21 and was found to be back in afib with rapid heart rates. She was asymptomatic at that time. She was started on diltiazem for rate control. She reports that she feels well today and is unaware of her arrhythmia. However, her heart rate is rapid again today. She does state she has had two recent steroid injections in her spine and hip. She did miss a dose of Eliquis but this was > 2 weeks ago.  ? ?Today, she denies symptoms of palpitations, chest pain, shortness of breath, orthopnea, PND, lower extremity edema, dizziness, presyncope, syncope, bleeding, or neurologic sequela. The patient is tolerating medications without difficulties and is otherwise without complaint today.  ? ? ?Atrial Fibrillation Risk Factors: ? ?she does have symptoms or diagnosis of sleep apnea. ?she is compliant with CPAP therapy. ?she does not have a history of rheumatic fever. ? ? ?she has a BMI of Body mass index is 28.98 kg/m?Marland KitchenMarland Kitchen ?Filed Weights  ? 09/22/21 1501  ?Weight: 67.3 kg  ? ? ? ?Family History  ?Problem Relation Age of Onset  ? Arthritis Mother   ?     Rheumatoid  ? Heart disease Mother   ?     Valvular  ? Hypertension Mother   ? Heart disease Father 29  ?     CABG  ? Arthritis Maternal Grandmother   ?     Rheumatoid  ? Hypertension Maternal Grandmother   ? Stroke Maternal Grandmother   ? Breast  cancer Paternal Aunt 71  ? Breast cancer Paternal Aunt 35  ? ? ? ?Atrial Fibrillation Management history: ? ?Previous antiarrhythmic drugs: amiodarone ?Previous cardioversions: 11/2018, 12/2018, 09/2019 ?Previous ablations: 04/09/20 ?CHADS2VASC score: 4 ?Anticoagulation history: Eliquis ? ? ?Past Medical History:  ?Diagnosis Date  ? Arrhythmia   ? atrial fibrillation  ? CHF (congestive heart failure) ([place])   ? Depression   ? Hyperlipidemia   ? Hypertension   ? Menopausal syndrome (hot flashes) 1992  ? used HRT fo 2 yrs   ? Obstructive sleep apnea   ? Persistent atrial fibrillation ([place])   ? ?Past Surgical History:  ?Procedure Laterality Date  ? APPENDECTOMY  1972  ? ATRIAL FIBRILLATION ABLATION N/A 04/09/2020  ? Procedure: ATRIAL FIBRILLATION ABLATION;  Surgeon: Vickie Epley, MD;  Location: [facility];  Service: Cardiovascular;  Laterality: N/A;  ? CARDIOVERSION N/A 11/28/2018  ? Procedure: CARDIOVERSION;  Surgeon: [name], MD;  Location: [facility];  Service: Cardiovascular;  Laterality: N/A;  ? CARDIOVERSION N/A 12/23/2018  ? Procedure: CARDIOVERSION;  Surgeon: [name], MD;  Location: [facility];  Service: Cardiovascular;  Laterality: N/A;  ? CARDIOVERSION N/A 09/30/2019  ? Procedure: CARDIOVERSION;  Surgeon: Nelva Bush, MD;  Location: [facility];  Service: Cardiovascular;  Laterality: N/A;  ? CESAREAN SECTION    ? [id number], 1972  ? TEE WITHOUT CARDIOVERSION N/A 11/28/2018  ?  Procedure: TRANSESOPHAGEAL ECHOCARDIOGRAM (TEE);  Surgeon: [name], MD;  Location: [facility];  Service: Cardiovascular;  Laterality: N/A;  ? ? ?Current Outpatient Medications  ?Medication Sig Dispense Refill  ? ascorbic acid (VITAMIN C) 500 MG tablet Take 500 mg by mouth daily.    ? cholecalciferol (VITAMIN D) 25 MCG (1000 UNIT) tablet Take 1,000 Units by mouth daily.    ? Coenzyme Q10 (COQ10) 200 MG CAPS Take 1 capsule by mouth daily.    ? diltiazem (CARDIZEM CD) 180 MG 24 hr capsule Take 1 capsule (180 mg  total) by mouth daily. 30 capsule 0  ? ELIQUIS 5 MG TABS tablet TAKE 1 TABLET(5 MG) BY MOUTH TWICE DAILY 180 tablet 1  ? losartan (COZAAR) 25 MG tablet TAKE 1/2 TABLET(12.5 MG) BY MOUTH DAILY 15 tablet 5  ? Magnesium 400 MG CAPS Take 1 capsule by mouth daily at 2 am.    ? Multiple Vitamin (MULTIVITAMIN WITH MINERALS) TABS tablet Take 1 tablet by mouth daily.    ? rosuvastatin (CRESTOR) 10 MG tablet Take 1 tablet (10 mg total) by mouth 2 (two) times a week. 24 tablet 3  ? traMADol (ULTRAM) 50 MG tablet Take 1 tablet (50 mg total) by mouth every 8 (eight) hours as needed for up to 5 days. 15 tablet 0  ? ?Current Facility-Administered Medications  ?Medication Dose Route Frequency Provider Last Rate Last Admin  ? metoprolol tartrate (LOPRESSOR) tablet 25 mg  25 mg Oral Once Crecencio Mc, MD      ? ? ?Allergies  ?Allergen Reactions  ? Codeine Nausea Only and Rash  ? Morphine Nausea And Vomiting  ?  Pt allergy may be codeine as codeine or morphine were combined on previous health history. Pt reported upset stomach on questionnaire.  ? Sulfa Antibiotics Nausea And Vomiting  ? ? ?Social History  ? ?Socioeconomic History  ? Marital status: Married  ?  Spouse name: Not on file  ? Number of children: Not on file  ? Years of education: Not on file  ? Highest education level: Not on file  ?Occupational History  ?  Employer: self  ?  Comment: Owner of a cleaning Business  ?Tobacco Use  ? Smoking status: Former  ?  Packs/day: 0.25  ?  Years: 15.00  ?  Pack years: 3.75  ?  Types: Cigarettes  ?  Quit date: 40  ?  Years since quitting: 31.2  ? Smokeless tobacco: Never  ? Tobacco comments:  ?  Former smoker 09/22/21  ?Vaping Use  ? Vaping Use: Never used  ?Substance and Sexual Activity  ? Alcohol use: Not Currently  ?  Comment: 1/2 glass of wine on occassion  ? Drug use: No  ? Sexual activity: Not on file  ?Other Topics Concern  ? Not on file  ?Social History Narrative  ? Not on file  ? ?Social Determinants of Health   ? ?Financial Resource Strain: Not on file  ?Food Insecurity: Not on file  ?Transportation Needs: Not on file  ?Physical Activity: Not on file  ?Stress: Not on file  ?Social Connections: Not on file  ?Intimate Partner Violence: Not on file  ? ? ? ?ROS- All systems are reviewed and negative except as per the HPI above. ? ?Physical Exam: ?Vitals:  ? 09/22/21 1501  ?BP: (!) 136/92  ?Pulse: (!) 144  ?Weight: 67.3 kg  ?Height: 5' (1.524 m)  ? ? ? ?GEN- The patient is a well appearing elderly female, alert  and oriented x 3 today.   ?HEENT-head normocephalic, atraumatic, sclera clear, conjunctiva pink, hearing intact, trachea midline. ?Lungs- Clear to ausculation bilaterally, normal work of breathing ?Heart- Regular rate and rhythm, tachycardia, no murmurs, rubs or gallops  ?GI- soft, NT, ND, + BS ?Extremities- no clubbing, cyanosis, or edema ?MS- no significant deformity or atrophy ?Skin- no rash or lesion ?Psych- euthymic mood, full affect ?Neuro- strength and sensation are intact ? ? ?Wt Readings from Last 3 Encounters:  ?09/22/21 67.3 kg  ?09/20/21 67.6 kg  ?09/20/21 67.6 kg  ? ? ?EKG today demonstrates  ?Atrial flutter with 2:1 block ?Vent. rate 144 BPM ?PR interval * ms ?QRS duration 68 ms ?QT/QTcB 294/455 ms ? ?Echo 11/26/19 demonstrated  ?1. Left ventricular ejection fraction, by estimation, is 55 to 60%. The  ?left ventricle has normal function. The left ventricle has no regional  ?wall motion abnormalities. Left ventricular diastolic parameters are  ?consistent with Grade II diastolic dysfunction (pseudonormalization).  ? 2. Right ventricular systolic function is normal. The right ventricular  ?size is normal.  ? 3. Left atrial size was severely dilated.  ? 4. The mitral valve is normal in structure. Mild mitral valve  ?regurgitation.  ? 5. The aortic valve is tricuspid. Aortic valve regurgitation is not  ?visualized. Mild aortic valve sclerosis is present, with no evidence of  ?aortic valve stenosis.  ? ?Epic  records are reviewed at length today ? ?CHA2DS2-VASc Score = 5  ?The patient's score is based upon: ?CHF History: 1 ?HTN History: 1 ?Diabetes History: 0 ?Stroke History: 0 ?Vascular Disease History: 0 ?Age Score: 2

## 2021-09-22 NOTE — Addendum Note (Signed)
Encounter addended by: Hinda Kehr, CMA on: 09/22/2021 4:21 PM ? Actions taken: Order list changed

## 2021-09-22 NOTE — Patient Instructions (Signed)
Increase Diltiazem '180mg'$  twice daily ? ?Day Before Cardioversion DECREASE Diltiazem '180mg'$  once daily ? ?Cardioversion scheduled for Friday April 14 ? - Arrive at the [facility] and go to admitting at 9:30am ? - Do not eat or drink anything after midnight the night prior to your procedure. ? - Take all your morning medication (except diabetic medications) with a sip of water prior to arrival. ? - You will not be able to drive home after your procedure. ? - Do NOT miss any doses of your blood thinner - if you should miss a dose please notify our office immediately. ? - If you feel as if you go back into normal rhythm prior to scheduled cardioversion, please notify our office immediately. If your procedure is canceled in the cardioversion suite you will be charged a cancellation fee. ? ? ?

## 2021-09-29 ENCOUNTER — Ambulatory Visit (HOSPITAL_COMMUNITY)
Admission: RE | Admit: 2021-09-29 | Discharge: 2021-09-29 | Disposition: A | Discharge status: Home / Self Care | Payer: Medicare Other | Source: Ambulatory Visit | Attending: Nurse Practitioner | Admitting: Nurse Practitioner

## 2021-09-29 VITALS — HR 135

## 2021-09-29 DIAGNOSIS — Z01810 Encounter for preprocedural cardiovascular examination: Secondary | ICD-10-CM | POA: Diagnosis not present

## 2021-09-29 DIAGNOSIS — I4892 Unspecified atrial flutter: Secondary | ICD-10-CM | POA: Diagnosis not present

## 2021-09-29 DIAGNOSIS — R9431 Abnormal electrocardiogram [ECG] [EKG]: Secondary | ICD-10-CM | POA: Diagnosis not present

## 2021-09-29 DIAGNOSIS — I4439 Other atrioventricular block: Secondary | ICD-10-CM | POA: Insufficient documentation

## 2021-09-29 DIAGNOSIS — I4819 Other persistent atrial fibrillation: Secondary | ICD-10-CM

## 2021-09-29 NOTE — H&P (View-Only) (Signed)
Pt here for ekg with cardioversion planned  for tomorrow as her fitbit indicated she had a regular HB of 80 bpm. Ekg shows atrial flutter at 135 bpm. She will proceed as planned for cardioversion tomorrow.  ?

## 2021-09-29 NOTE — Progress Notes (Signed)
Pt here for ekg with cardioversion planned  for tomorrow as her fitbit indicated she had a regular HB of 80 bpm. Ekg shows atrial flutter at 135 bpm. She will proceed as planned for cardioversion tomorrow.  ?

## 2021-09-30 ENCOUNTER — Other Ambulatory Visit: Payer: Self-pay

## 2021-09-30 ENCOUNTER — Ambulatory Visit (HOSPITAL_COMMUNITY): Payer: Medicare Other | Admitting: Anesthesiology

## 2021-09-30 ENCOUNTER — Other Ambulatory Visit: Payer: Self-pay | Admitting: Cardiology

## 2021-09-30 ENCOUNTER — Encounter (HOSPITAL_COMMUNITY): Payer: Self-pay | Admitting: Cardiology

## 2021-09-30 ENCOUNTER — Encounter (HOSPITAL_COMMUNITY)
Admission: AD | Disposition: A | Discharge status: Home / Self Care | Payer: Self-pay | Source: Home / Self Care | Attending: Cardiology

## 2021-09-30 ENCOUNTER — Ambulatory Visit (HOSPITAL_BASED_OUTPATIENT_CLINIC_OR_DEPARTMENT_OTHER): Payer: Medicare Other | Admitting: Anesthesiology

## 2021-09-30 ENCOUNTER — Observation Stay (HOSPITAL_COMMUNITY)
Admission: AD | Admit: 2021-09-30 | Discharge: 2021-09-30 | Disposition: A | Discharge status: Home / Self Care | Payer: Medicare Other | Attending: Cardiology | Admitting: Cardiology

## 2021-09-30 DIAGNOSIS — I4892 Unspecified atrial flutter: Secondary | ICD-10-CM

## 2021-09-30 DIAGNOSIS — I2510 Atherosclerotic heart disease of native coronary artery without angina pectoris: Secondary | ICD-10-CM

## 2021-09-30 DIAGNOSIS — Z79899 Other long term (current) drug therapy: Secondary | ICD-10-CM | POA: Insufficient documentation

## 2021-09-30 DIAGNOSIS — I509 Heart failure, unspecified: Secondary | ICD-10-CM | POA: Diagnosis not present

## 2021-09-30 DIAGNOSIS — I429 Cardiomyopathy, unspecified: Secondary | ICD-10-CM | POA: Insufficient documentation

## 2021-09-30 DIAGNOSIS — D6869 Other thrombophilia: Secondary | ICD-10-CM | POA: Insufficient documentation

## 2021-09-30 DIAGNOSIS — I110 Hypertensive heart disease with heart failure: Secondary | ICD-10-CM | POA: Insufficient documentation

## 2021-09-30 DIAGNOSIS — I4891 Unspecified atrial fibrillation: Secondary | ICD-10-CM | POA: Diagnosis not present

## 2021-09-30 DIAGNOSIS — Z87891 Personal history of nicotine dependence: Secondary | ICD-10-CM | POA: Insufficient documentation

## 2021-09-30 DIAGNOSIS — I5022 Chronic systolic (congestive) heart failure: Secondary | ICD-10-CM | POA: Diagnosis not present

## 2021-09-30 DIAGNOSIS — Z7901 Long term (current) use of anticoagulants: Secondary | ICD-10-CM | POA: Insufficient documentation

## 2021-09-30 DIAGNOSIS — I4819 Other persistent atrial fibrillation: Secondary | ICD-10-CM | POA: Diagnosis not present

## 2021-09-30 HISTORY — PX: CARDIOVERSION: SHX1299

## 2021-09-30 SURGERY — CARDIOVERSION
Anesthesia: General

## 2021-09-30 MED ORDER — SODIUM CHLORIDE 0.9 % IV SOLN: INTRAVENOUS | Status: DC

## 2021-09-30 MED ORDER — PROPOFOL 10 MG/ML IV BOLUS
INTRAVENOUS | Status: DC | PRN
  Administered 2021-09-30: 50 mg via INTRAVENOUS

## 2021-09-30 MED ORDER — LIDOCAINE 2% (20 MG/ML) 5 ML SYRINGE
INTRAMUSCULAR | Status: DC | PRN
  Administered 2021-09-30: 60 mg via INTRAVENOUS

## 2021-09-30 MED ORDER — SODIUM CHLORIDE 0.9 % IV SOLN: INTRAVENOUS | Status: DC | PRN

## 2021-09-30 NOTE — Interval H&P Note (Signed)
History and Physical Interval Note: ? ?09/30/2021 ?9:21 AM ? ?Melissa Bonilla  has presented today for surgery, with the diagnosis of AFIB.  The various methods of treatment have been discussed with the patient and family. After consideration of risks, benefits and other options for treatment, the patient has consented to  Procedure(s): ?CARDIOVERSION (N/A) as a surgical intervention.  The patient's history has been reviewed, patient examined, no change in status, stable for surgery.  I have reviewed the patient's chart and labs.  Questions were answered to the patient's satisfaction.   ? ? ?Krystofer Hevener ? ? ?

## 2021-09-30 NOTE — Transfer of Care (Signed)
Immediate Anesthesia Transfer of Care Note ? ?Patient: Melissa Bonilla ? ?Procedure(s) Performed: CARDIOVERSION ? ?Patient Location: Endoscopy Unit ? ?Anesthesia Type:General ? ?Level of Consciousness: drowsy ? ?Airway & Oxygen Therapy: Patient Spontanous Breathing ? ?Post-op Assessment: Report given to RN and Post -op Vital signs reviewed and stable ? ?Post vital signs: Reviewed and stable ? ?Last Vitals:  ?Vitals Value Taken Time  ?BP    ?Temp    ?Pulse    ?Resp    ?SpO2    ? ? ?Last Pain:  ?Vitals:  ? 09/30/21 0902  ?TempSrc: Tympanic  ?PainSc: 0-No pain  ?   ? ?  ? ?Complications: No notable events documented. ?

## 2021-09-30 NOTE — Discharge Instructions (Addendum)

## 2021-09-30 NOTE — Anesthesia Postprocedure Evaluation (Signed)
Anesthesia Post Note ? ?Patient: Melissa Bonilla ? ?Procedure(s) Performed: CARDIOVERSION ? ?  ? ?Patient location during evaluation: PACU ?Anesthesia Type: General ?Level of consciousness: awake and alert, oriented and patient cooperative ?Pain management: pain level controlled ?Vital Signs Assessment: post-procedure vital signs reviewed and stable ?Respiratory status: spontaneous breathing, nonlabored ventilation and respiratory function stable ?Cardiovascular status: blood pressure returned to baseline and stable ?Postop Assessment: no apparent nausea or vomiting ?Anesthetic complications: no ? ? ?No notable events documented. ? ?Last Vitals:  ?Vitals:  ? 09/30/21 0902 09/30/21 0940  ?BP: 134/85 107/78  ?Pulse: (!) 144 (!) 116  ?Resp: 17 20  ?Temp: (!) 36.4 ?C (!) 35.8 ?C  ?SpO2: 98% 95%  ?  ?Last Pain:  ?Vitals:  ? 09/30/21 0940  ?TempSrc: Tympanic  ?PainSc: 0-No pain  ? ? ?  ?  ?  ?  ?  ?  ? ?Jarome Matin Nhu Glasby ? ? ? ? ?

## 2021-09-30 NOTE — Anesthesia Preprocedure Evaluation (Addendum)
Anesthesia Evaluation  ?Patient identified by MRN, date of birth, ID band ?Patient awake ? ? ? ?Reviewed: ?Allergy & Precautions, NPO status , Patient's Chart, lab work & pertinent test results ? ?Airway ?Mallampati: II ? ?TM Distance: >3 FB ?Neck ROM: Full ? ? ? Dental ?no notable dental hx. ? ?  ?Pulmonary ?sleep apnea , former smoker,  ?  ?Pulmonary exam normal ?breath sounds clear to auscultation ? ? ? ? ? ? Cardiovascular ?hypertension, Pt. on medications ?+ CAD and +CHF (grade 2 diastolic dysfunction)  ?+ dysrhythmias (eliquis) Atrial Fibrillation + Valvular Problems/Murmurs (mild MR) MR  ?Rhythm:Irregular Rate:Normal ? ?Echo 2022 ??1. Left ventricular ejection fraction, by estimation, is 55 to 60%. The  ?left ventricle has normal function. The left ventricle has no regional  ?wall motion abnormalities. Left ventricular diastolic parameters are  ?consistent with Grade II diastolic  ?dysfunction (pseudonormalization).  ??2. Right ventricular systolic function is normal. The right ventricular  ?size is normal.  ??3. Left atrial size was severely dilated.  ??4. The mitral valve is normal in structure. Mild mitral valve  ?regurgitation.  ??5. The aortic valve is tricuspid. Aortic valve regurgitation is not  ?visualized. Mild aortic valve sclerosis is present, with no evidence of  ?aortic valve stenosis.  ?  ?Neuro/Psych ?PSYCHIATRIC DISORDERS Depression negative neurological ROS ?   ? GI/Hepatic ?negative GI ROS, Neg liver ROS,   ?Endo/Other  ?negative endocrine ROS ? Renal/GU ?negative Renal ROS  ?negative genitourinary ?  ?Musculoskeletal ?negative musculoskeletal ROS ?(+)  ? Abdominal ?  ?Peds ? Hematology ?negative hematology ROS ?(+)   ?Anesthesia Other Findings ? ? Reproductive/Obstetrics ?negative OB ROS ? ?  ? ? ? ? ? ? ? ? ? ? ? ? ? ?  ?  ? ? ? ? ? ? ? ?Anesthesia Physical ?Anesthesia Plan ? ?ASA: 3 ? ?Anesthesia Plan: General  ? ?Post-op Pain Management:   ? ?Induction:  Intravenous ? ?PONV Risk Score and Plan: TIVA and Treatment may vary due to age or medical condition ? ?Airway Management Planned: Natural Airway and Mask ? ?Additional Equipment: None ? ?Intra-op Plan:  ? ?Post-operative Plan:  ? ?Informed Consent: I have reviewed the patients History and Physical, chart, labs and discussed the procedure including the risks, benefits and alternatives for the proposed anesthesia with the patient or authorized representative who has indicated his/her understanding and acceptance.  ? ? ? ?Dental advisory given ? ?Plan Discussed with: CRNA ? ?Anesthesia Plan Comments:   ? ? ? ? ? ?Anesthesia Quick Evaluation ? ?

## 2021-09-30 NOTE — CV Procedure (Addendum)
? ?  Electrical Cardioversion Procedure Note ?Melissa Bonilla ?[id number] ?Jan 29, 1946 ? ?Procedure: Electrical Cardioversion ?Indications:  Atrial Flutter ? ?Time Out: Verified patient identification, verified procedure,medications/allergies/relevent history reviewed, required imaging and test results available.  Performed ? ?Procedure Details ? ?The patient was NPO after midnight. Anesthesia was administered at the beside  by Dr.Miller with '80mg'$  of propofol and '60mg'$  of Lidocaine.  Cardioversion was done with synchronized biphasic defibrillation with AP pads with 150watts.  The patient failed to convert to NSR.  Cardioversion was done with synchronized biphasic defibrillation with AP pads with 200watts.  The patient failed to convert to NSR. Cardioversion was done again with synchronized biphasic defibrillation with AP pads with 200watts. The patient failed to convert to NSR.  The patient tolerated the procedure well  ? ?IMPRESSION: ? ?Unsuccessful cardioversion of atrial flutter with HR persistently in the 120's. ? ?Plan for admission for for Antiarrhythmic drug therapy ? ? ?Melissa Bonilla ?09/30/2021, 9:21 AM ? ?Addendum: ? ?Patient spontaneously converted to NSR.  Seen by EP.  Plan to discharge home with outpt followup.  ? ?Fransico Him, MD ?[facility] ?09/30/2021, 11:26am ?  ?

## 2021-10-02 DIAGNOSIS — G4733 Obstructive sleep apnea (adult) (pediatric): Secondary | ICD-10-CM | POA: Diagnosis not present

## 2021-10-03 ENCOUNTER — Encounter (HOSPITAL_COMMUNITY): Payer: Self-pay | Admitting: Cardiology

## 2021-10-04 ENCOUNTER — Telehealth: Payer: Self-pay | Admitting: Cardiology

## 2021-10-04 NOTE — Telephone Encounter (Signed)
Spoke to the patient who stated she called [facility] to get a refill and they told her they didn't have anything for her.  ? ?I called [facility] and they found the orders, advised they would get the refill fixed for her to come pick up.  ? ?Called the patient back and advised [facility] found it now and they are filling it for her to pick up.  ? ?Patient thankful and verbalized understanding.  ?

## 2021-10-04 NOTE — Telephone Encounter (Signed)
?  Pt c/o medication issue: ? ?1. Name of Medication: diltiazem (CARDIZEM CD) 180 MG 24 hr capsule ? ?2. How are you currently taking this medication (dosage and times per day)? Take 1 capsule (180 mg total) by mouth 2 (two) times daily. ? ?3. Are you having a reaction (difficulty breathing--STAT)?  ? ?4. What is your medication issue? Pt wanted to check in with Dr. Quentin Ore if she needs to continue taking this meds. If yes she needs a refill. She just wanted to know first before getting a refill ? ?

## 2021-10-12 ENCOUNTER — Ambulatory Visit: Payer: Medicare Other | Admitting: Cardiology

## 2021-10-12 ENCOUNTER — Encounter: Payer: Self-pay | Admitting: Cardiology

## 2021-10-12 VITALS — BP 120/70 | HR 71 | Ht 60.0 in | Wt 145.0 lb

## 2021-10-12 DIAGNOSIS — I4819 Other persistent atrial fibrillation: Secondary | ICD-10-CM

## 2021-10-12 DIAGNOSIS — I4892 Unspecified atrial flutter: Secondary | ICD-10-CM | POA: Diagnosis not present

## 2021-10-12 DIAGNOSIS — I5032 Chronic diastolic (congestive) heart failure: Secondary | ICD-10-CM | POA: Diagnosis not present

## 2021-10-12 NOTE — Patient Instructions (Signed)
Medication Instructions:  ?Your physician recommends that you continue on your current medications as directed. Please refer to the Current Medication list given to you today. ? ?*If you need a refill on your cardiac medications before your next appointment, please call your pharmacy* ? ? ?Lab Work: ?None ordered ? ?If you have labs (blood work) drawn today and your tests are completely normal, you will receive your results only by: ?MyChart Message (if you have MyChart) OR ?A paper copy in the mail ?If you have any lab test that is abnormal or we need to change your treatment, we will call you to review the results. ? ? ?Testing/Procedures: ?None ordered ? ?Follow-Up: ?At [facility], you and your health needs are our priority.  As part of our continuing mission to provide you with exceptional heart care, we have created designated Provider Care Teams.  These Care Teams include your primary Cardiologist (physician) and Advanced Practice Providers (APPs -  Physician Assistants and Nurse Practitioners) who all work together to provide you with the care you need, when you need it. ? ?We recommend signing up for the patient portal called "MyChart".  Sign up information is provided on this After Visit Summary.  MyChart is used to connect with patients for Virtual Visits (Telemedicine).  Patients are able to view lab/test results, encounter notes, upcoming appointments, etc.  Non-urgent messages can be sent to your provider as well.   ?To learn more about what you can do with MyChart, go to [email or web address].   ? ?Your next appointment:   ?6 month(s) ? ?The format for your next appointment:   ?In Person ? ?Provider:   ?Lars Mage, MD{ ? ?Important Information About Sugar ? ? ? ? ? ? ?

## 2021-10-12 NOTE — Progress Notes (Signed)
?Electrophysiology Office Follow up Visit Note:   ? ?Date:  10/12/2021  ? ?ID:  Melissa Bonilla, DOB [birth year], MRN [id number] ? ?PCP:  Crecencio Mc, MD  ?[facility] Cardiologist:  Nelva Bush, MD  ?[facility] Electrophysiologist:  Vickie Epley, MD  ? ? ?Interval History:   ? ? ?Melissa Bonilla is a 76 y.o. female who presents for a follow up visit.  She has a history of atrial fibrillation and flutter diagnosed in 2020 on Eliquis.  On April 09, 2020 she underwent an A-fib ablation with me.  During that procedure the pulmonary veins and posterior wall were ablated.  She had recent steroid injections in her back and hip and then was noted to be back in atrial fibrillation/flutter by her primary care physician on September 20, 2021.  Cardioversion was arranged and her diltiazem was increased.  She had a cardioversion September 30, 2021 and has done well since that time. ? ?She is with her husband today in clinic.  She has done well since the cardioversion without recurrence of arrhythmia.  Before the cardioversion she had not had an episode of atrial fibrillation or flutter since the ablation.  She had not gotten a back injection of steroids that she thinks triggered the episode.  She continues to take Eliquis without bleeding issues. ? ?  ? ?Past Medical History:  ?Diagnosis Date  ? Arrhythmia   ? atrial fibrillation  ? CHF (congestive heart failure) ([place])   ? Depression   ? Hyperlipidemia   ? Hypertension   ? Menopausal syndrome (hot flashes) 1992  ? used HRT fo 2 yrs   ? Obstructive sleep apnea   ? Persistent atrial fibrillation ([place])   ? ? ?Past Surgical History:  ?Procedure Laterality Date  ? APPENDECTOMY  1972  ? ATRIAL FIBRILLATION ABLATION N/A 04/09/2020  ? Procedure: ATRIAL FIBRILLATION ABLATION;  Surgeon: Vickie Epley, MD;  Location: [facility];  Service: Cardiovascular;  Laterality: N/A;  ? CARDIOVERSION N/A 11/28/2018  ? Procedure: CARDIOVERSION;  Surgeon: [name], MD;   Location: [facility];  Service: Cardiovascular;  Laterality: N/A;  ? CARDIOVERSION N/A 12/23/2018  ? Procedure: CARDIOVERSION;  Surgeon: [name], MD;  Location: [facility];  Service: Cardiovascular;  Laterality: N/A;  ? CARDIOVERSION N/A 09/30/2019  ? Procedure: CARDIOVERSION;  Surgeon: Nelva Bush, MD;  Location: [facility];  Service: Cardiovascular;  Laterality: N/A;  ? CARDIOVERSION N/A 09/30/2021  ? Procedure: CARDIOVERSION;  Surgeon: Sueanne Margarita, MD;  Location: [facility];  Service: Cardiovascular;  Laterality: N/A;  ? CESAREAN SECTION    ? [phone], 1972  ? TEE WITHOUT CARDIOVERSION N/A 11/28/2018  ? Procedure: TRANSESOPHAGEAL ECHOCARDIOGRAM (TEE);  Surgeon: [name], MD;  Location: [facility];  Service: Cardiovascular;  Laterality: N/A;  ? ? ?Current Medications: ?Current Meds  ?Medication Sig  ? acetaminophen (TYLENOL) 650 MG CR tablet Take 1,300 mg by mouth every 8 (eight) hours as needed for pain.  ? ascorbic acid (VITAMIN C) 500 MG tablet Take 500 mg by mouth daily.  ? cholecalciferol (VITAMIN D) 25 MCG (1000 UNIT) tablet Take 1,000 Units by mouth daily.  ? Coenzyme Q10 (COQ10) 200 MG CAPS Take 1 capsule by mouth daily.  ? diltiazem (CARDIZEM CD) 180 MG 24 hr capsule Take 1 capsule (180 mg total) by mouth 2 (two) times daily.  ? ELIQUIS 5 MG TABS tablet TAKE 1 TABLET(5 MG) BY MOUTH TWICE DAILY  ? losartan (COZAAR) 25 MG tablet TAKE  1/2 TABLET(12.5 MG) BY MOUTH DAILY  ? Magnesium 400 MG CAPS Take 1 capsule by mouth daily at 2 am.  ? Multiple Vitamin (MULTIVITAMIN WITH MINERALS) TABS tablet Take 1 tablet by mouth daily.  ? rosuvastatin (CRESTOR) 10 MG tablet Take 1 tablet (10 mg total) by mouth 2 (two) times a week.  ? vitamin B-12 (CYANOCOBALAMIN) 1000 MCG tablet Take 1,000 mcg by mouth daily.  ?  ? ?Allergies:   Codeine, Morphine, and Sulfa antibiotics  ? ?Social History  ? ?Socioeconomic History  ? Marital status: Married  ?  Spouse name: Not on file  ? Number of children: Not on file   ? Years of education: Not on file  ? Highest education level: Not on file  ?Occupational History  ?  Employer: self  ?  Comment: Owner of a cleaning Business  ?Tobacco Use  ? Smoking status: Former  ?  Packs/day: 0.25  ?  Years: 15.00  ?  Pack years: 3.75  ?  Types: Cigarettes  ?  Quit date: 40  ?  Years since quitting: 31.3  ? Smokeless tobacco: Never  ? Tobacco comments:  ?  Former smoker 09/22/21  ?Vaping Use  ? Vaping Use: Never used  ?Substance and Sexual Activity  ? Alcohol use: Not Currently  ?  Comment: 1/2 glass of wine on occassion  ? Drug use: No  ? Sexual activity: Not on file  ?Other Topics Concern  ? Not on file  ?Social History Narrative  ? Not on file  ? ?Social Determinants of Health  ? ?Financial Resource Strain: Not on file  ?Food Insecurity: Not on file  ?Transportation Needs: Not on file  ?Physical Activity: Not on file  ?Stress: Not on file  ?Social Connections: Not on file  ?  ? ?Family History: ?The patient's family history includes Arthritis in her maternal grandmother and mother; Breast cancer (age of onset: 59) in her paternal aunt; Breast cancer (age of onset: 37) in her paternal aunt; Heart disease in her mother; Heart disease (age of onset: 34) in her father; Hypertension in her maternal grandmother and mother; Stroke in her maternal grandmother. ? ?ROS:   ?Please see the history of present illness.    ?All other systems reviewed and are negative. ? ?EKGs/Labs/Other Studies Reviewed:   ? ?The following studies were reviewed today: ? ? ? ? ?EKG on September 29, 2021 shows typical appearing atrial flutter ?EKG on September 20, 2021 shows typical appearing atrial flutter ? ? ?EKG:  The ekg ordered today demonstrates sinus rhythm. ? ?Recent Labs: ?09/20/2021: ALT 24 ?09/22/2021: BUN 19; Creatinine, Ser 0.89; Hemoglobin 14.6; Platelets 233; Potassium 5.0; Sodium 139  ?Recent Lipid Panel ?   ?Component Value Date/Time  ? CHOL 239 (H) 04/12/2021 1434  ? TRIG 136.0 04/12/2021 1434  ? HDL 51.40 04/12/2021  1434  ? CHOLHDL 5 04/12/2021 1434  ? VLDL 27.2 04/12/2021 1434  ? [place] 160 (H) 04/12/2021 1434  ? LDLDIRECT 175.5 03/19/2013 0930  ? ? ?Physical Exam:   ? ?VS:  BP 120/70 (BP Location: Left Arm, Patient Position: Sitting, Cuff Size: Normal)   Pulse 71   Ht 5' (1.524 m)   Wt 145 lb (65.8 kg)   SpO2 96%   BMI 28.32 kg/m?    ? ?Wt Readings from Last 3 Encounters:  ?10/12/21 145 lb (65.8 kg)  ?09/30/21 146 lb (66.2 kg)  ?09/22/21 148 lb 6.4 oz (67.3 kg)  ?  ? ?GEN:  Well nourished, well  developed in no acute distress ?HEENT: Normal ?NECK: No JVD; No carotid bruits ?LYMPHATICS: No lymphadenopathy ?CARDIAC: RRR, no murmurs, rubs, gallops ?RESPIRATORY:  Clear to auscultation without rales, wheezing or rhonchi  ?ABDOMEN: Soft, non-tender, non-distended ?MUSCULOSKELETAL:  No edema; No deformity  ?SKIN: Warm and dry ?NEUROLOGIC:  Alert and oriented x 3 ?PSYCHIATRIC:  Normal affect  ? ? ? ?  ? ?ASSESSMENT:   ? ?1. Atrial flutter with rapid ventricular response ([place])   ?2. Persistent atrial fibrillation ([place])   ?3. Chronic diastolic heart failure ([place])   ? ?PLAN:   ? ?In order of problems listed above: ? ? ?#Atrial flutter ?New arrhythmia since ablation.  During the ablation I performed a PVI and posterior wall isolation.  We discussed treatment options today for her atrial flutter including continued treatment with calcium channel blocker versus proceeding directly to ablation.  I think is very reasonable to wait to see how she does on calcium channel blocker and avoiding steroid injections.  Hopefully this was an isolated episode.  We discussed that if it recurs, would plan to proceed to repeat ablation.  During a repeat ablation, I would take care of the atrial flutter with a CTI ablation and also recheck the pulmonary veins and posterior wall.  Continue Eliquis in the meantime. ? ?I will have her follow-up in 6 months with me to see how she is done.  She is also going to keep a check on her heart rhythms using a  cardia mobile device. ? ? ?Medication Adjustments/Labs and Tests Ordered: ?Current medicines are reviewed at length with the patient today.  Concerns regarding medicines are outlined above.  ?Orders Placed Thi

## 2021-10-13 ENCOUNTER — Telehealth: Payer: Self-pay | Admitting: Internal Medicine

## 2021-10-13 MED ORDER — GABAPENTIN 100 MG PO CAPS: 100.0000 mg | ORAL_CAPSULE | Freq: Three times a day (TID) | ORAL | 3 refills | Status: DC

## 2021-10-13 MED ORDER — FAMCICLOVIR 500 MG PO TABS: 500.0000 mg | ORAL_TABLET | Freq: Three times a day (TID) | ORAL | 0 refills | Status: DC

## 2021-10-13 NOTE — Telephone Encounter (Signed)
Patient has an appointment with Dr Derrel Nip on 10/18/21 to discuss getting the Shingles Shot. Patient states she got the shingles again and wanted to know if Dr Derrel Nip would call something in for her. ?

## 2021-10-13 NOTE — Addendum Note (Signed)
Addended by: Crecencio Mc on: 10/13/2021 08:09 PM ? ? Modules accepted: Orders ? ?

## 2021-10-13 NOTE — Telephone Encounter (Addendum)
Famciclovir and gabapentin sent to pharmacy .  Take both three times daily  ?

## 2021-10-13 NOTE — Telephone Encounter (Signed)
Pt stated that she was seen for shingles on 09/20/2021. Pt was not prescribed any medication at that time because they seemed to have been healing, however they were not healing. Pt stated that they are located on her left buttocks and there is one spot that is painful. Pt is wanting to know if something can be called in to clear them up. Pt is aware that Dr. Derrel Nip is out of the office for the day and we will not be able to call her back until tomorrow morning. Pt stated that is fine.  ?

## 2021-10-18 ENCOUNTER — Ambulatory Visit: Payer: Medicare Other | Admitting: Internal Medicine

## 2021-10-30 ENCOUNTER — Other Ambulatory Visit: Payer: Self-pay | Admitting: Internal Medicine

## 2021-11-01 DIAGNOSIS — G4733 Obstructive sleep apnea (adult) (pediatric): Secondary | ICD-10-CM | POA: Diagnosis not present

## 2021-11-07 ENCOUNTER — Telehealth: Payer: Self-pay | Admitting: Internal Medicine

## 2021-11-07 NOTE — Telephone Encounter (Signed)
Refilled: 09/20/2021 Last OV: 09/20/2021 Next OV: not scheduled

## 2021-11-07 NOTE — Telephone Encounter (Signed)
Pt need refill on Tramadol sent to [facility] in [place]

## 2021-11-08 MED ORDER — TRAMADOL HCL 50 MG PO TABS: 50.0000 mg | ORAL_TABLET | Freq: Every day | ORAL | 5 refills | Status: AC | PRN

## 2021-11-08 NOTE — Addendum Note (Signed)
Addended by: Crecencio Mc on: 11/08/2021 03:18 PM   Modules accepted: Orders

## 2021-11-08 NOTE — Telephone Encounter (Signed)
Pt wanting an update on medication. Pt want to be called

## 2021-11-08 NOTE — Telephone Encounter (Signed)
It was prescribed by you on 09/20/2021. Pt stated that she is taking it once daily as needed.

## 2021-11-16 DIAGNOSIS — G8929 Other chronic pain: Secondary | ICD-10-CM | POA: Diagnosis not present

## 2021-11-16 DIAGNOSIS — M48062 Spinal stenosis, lumbar region with neurogenic claudication: Secondary | ICD-10-CM | POA: Diagnosis not present

## 2021-11-16 DIAGNOSIS — M5442 Lumbago with sciatica, left side: Secondary | ICD-10-CM | POA: Diagnosis not present

## 2021-11-16 DIAGNOSIS — M25551 Pain in right hip: Secondary | ICD-10-CM | POA: Diagnosis not present

## 2021-11-25 ENCOUNTER — Telehealth: Payer: Medicare Other | Admitting: Internal Medicine

## 2021-11-25 ENCOUNTER — Ambulatory Visit: Payer: Medicare Other | Admitting: Internal Medicine

## 2021-11-25 ENCOUNTER — Telehealth: Payer: Self-pay | Admitting: Internal Medicine

## 2021-11-25 DIAGNOSIS — R000 Tachycardia, unspecified: Secondary | ICD-10-CM

## 2021-11-25 NOTE — Telephone Encounter (Signed)
Pt c/o medication issue:  1. Name of Medication: diltiazem (CARDIZEM CD) 180 MG 24 hr capsule  2. How are you currently taking this medication (dosage and times per day)? Take 1 capsule (180 mg total) by mouth 2 (two) times daily.  3. Are you having a reaction (difficulty breathing--STAT)? no  4. What is your medication issue? Patient thinks this medication is causing her HR to speed up.     STAT if HR is under 50 or over 120 (normal HR is 60-100 beats per minute)  What is your heart rate? Right now its 97 but it goes between 107-127  Do you have a log of your heart rate readings (document readings)?    Do you have any other symptoms? Back pain

## 2021-11-25 NOTE — Telephone Encounter (Signed)
I spoke with patient.  She reports she started having back pain this past Monday.  After back pain started she noticed elevation in heart rate. Has osteoarthritis.  Currently heart rate is 94 but will go up to 104-120 with activity.  Has not checked BP this week.  Is taking Cardizem CD 180 mg twice daily. Will forward to Dr Quentin Ore for review/recommendations.

## 2021-11-28 ENCOUNTER — Ambulatory Visit (INDEPENDENT_AMBULATORY_CARE_PROVIDER_SITE_OTHER): Payer: Medicare Other

## 2021-11-28 DIAGNOSIS — R000 Tachycardia, unspecified: Secondary | ICD-10-CM

## 2021-11-28 NOTE — Telephone Encounter (Signed)
Notified the patient of recommendations. Verified address. Ordered heart monitor.  Verbalized understanding and agreement.

## 2021-11-28 NOTE — Telephone Encounter (Signed)
Left message to call  back.     Not sure what that rhythm would be. Possibly sinus tachycardia.   Otila Kluver, can you order a Zio 2 week monitor to assess.  We'll figure out follow up based on the monitor results.   Thanks,   Lars Mage

## 2021-11-30 ENCOUNTER — Emergency Department: Payer: Medicare Other

## 2021-11-30 ENCOUNTER — Encounter: Payer: Self-pay | Admitting: Medical Oncology

## 2021-11-30 ENCOUNTER — Emergency Department
Admission: EM | Admit: 2021-11-30 | Discharge: 2021-11-30 | Disposition: A | Discharge status: Home / Self Care | Payer: Medicare Other | Attending: Emergency Medicine | Admitting: Emergency Medicine

## 2021-11-30 DIAGNOSIS — S22069A Unspecified fracture of T7-T8 vertebra, initial encounter for closed fracture: Secondary | ICD-10-CM | POA: Diagnosis not present

## 2021-11-30 DIAGNOSIS — S29002A Unspecified injury of muscle and tendon of back wall of thorax, initial encounter: Secondary | ICD-10-CM | POA: Diagnosis present

## 2021-11-30 DIAGNOSIS — S22000A Wedge compression fracture of unspecified thoracic vertebra, initial encounter for closed fracture: Secondary | ICD-10-CM

## 2021-11-30 DIAGNOSIS — M546 Pain in thoracic spine: Secondary | ICD-10-CM | POA: Diagnosis not present

## 2021-11-30 DIAGNOSIS — W06XXXA Fall from bed, initial encounter: Secondary | ICD-10-CM | POA: Insufficient documentation

## 2021-11-30 DIAGNOSIS — S22060A Wedge compression fracture of T7-T8 vertebra, initial encounter for closed fracture: Secondary | ICD-10-CM

## 2021-11-30 DIAGNOSIS — M545 Low back pain, unspecified: Secondary | ICD-10-CM | POA: Diagnosis not present

## 2021-11-30 MED ORDER — OXYCODONE HCL 5 MG PO TABS
5.0000 mg | ORAL_TABLET | Freq: Once | ORAL | Status: AC
  Administered 2021-11-30: 5 mg via ORAL
  Filled 2021-11-30: qty 1

## 2021-11-30 MED ORDER — OXYCODONE HCL 5 MG PO TABS
ORAL_TABLET | ORAL | 0 refills | Status: DC
Start: 2021-11-30 — End: 2021-12-07

## 2021-11-30 NOTE — Discharge Instructions (Addendum)
Follow-up with your orthopedist.  Call make an appointment to be seen.  A prescription for pain medication was sent to the pharmacy.  Be aware that this medication could cause drowsiness and increase your risk for falling.  Also try putting 2 pillows under your knees when you are sleeping in bed to see if this helps with your back pain.  Continue with your regular medication.

## 2021-11-30 NOTE — ED Notes (Signed)
Pt A&O, pt given discharge instructions, pt able to ambulate but was wheeled out in wheelchair by spouse.

## 2021-11-30 NOTE — ED Notes (Signed)
Pt to ED with husband, complains of back pain since January, currently worse on R side and shooting down R leg. States has had xrays and MRI and was getting steroid shots, but her HR went up so pt is supposed to get Holter monitor this week before next scheduled steroid shot. Has been following with PCP.

## 2021-11-30 NOTE — ED Provider Notes (Signed)
[facility] Provider Note    Event Date/Time   First MD Initiated Contact with Patient 11/30/21 [phone]     (approximate)   History   Back Pain   HPI  Melissa Bonilla is a 76 y.o. female presents to the ED with complaint of back pain.  Patient has history of chronic back pain and has been seen over at the orthopedic clinic.  Patient states that she was made aware that she has osteoporosis and also has a fracture in her back.  She states this morning that she rolled out of bed as usual due to her chronic back pain but landed on her buttocks on the floor.  Denies any head injury or loss of consciousness.  Denies any incontinence of bowel or bladder.  Patient currently has been doing physical therapy and was to have a follow-up appointment with the orthopedist.     Physical Exam   Triage Vital Signs: ED Triage Vitals  Enc Vitals Group     BP 11/30/21 0832 137/90     Pulse Rate 11/30/21 0832 (!) 103     Resp 11/30/21 0832 18     Temp 11/30/21 0832 98.1 F (36.7 C)     Temp Source 11/30/21 0832 Oral     SpO2 11/30/21 0832 94 %     Weight 11/30/21 0831 145 lb (65.8 kg)     Height 11/30/21 0831 5' (1.524 m)     Head Circumference --      Peak Flow --      Pain Score 11/30/21 0831 10     Pain Loc --      Pain Edu? --      Excl. in [place]? --     Most recent vital signs: Vitals:   11/30/21 0832 11/30/21 1229  BP: 137/90 140/87  Pulse: (!) 103 (!) 109  Resp: 18 18  Temp: 98.1 F (36.7 C) 98.1 F (36.7 C)  SpO2: 94% 94%     General: Awake, no distress.  CV:  Good peripheral perfusion.  Resp:  Normal effort.  Abd:  No distention.  Other:  On examination of the back there is no gross deformity noted.  Patient is tender lower thoracic and upper lumbar region.  No step-offs are appreciated.  No erythema, abrasions or skin discoloration is noted.   ED Results / Procedures / Treatments   Labs (all labs ordered are listed, but only abnormal results are  displayed) Labs Reviewed - No data to display     RADIOLOGY Thoracic and lumbar spine x-rays were reviewed and interpreted by myself with noted compression fractures present.  Official radiology report shows L3 compression fracture stable and healing compared to previous imaging.  Thoracic spine x-rays show a T8 compression fracture at approximately 40% and possibly pression fractures noted at T10, T11, and T12.    PROCEDURES:  Critical Care performed:   Procedures   MEDICATIONS ORDERED IN ED: Medications  oxyCODONE (Oxy IR/ROXICODONE) immediate release tablet 5 mg (5 mg Oral Given 11/30/21 1007)     IMPRESSION / MDM / ASSESSMENT AND PLAN / ED COURSE  I reviewed the triage vital signs and the nursing notes.   Differential diagnosis includes, but is not limited to, contusion back secondary to fall, compression fracture, musculoskeletal strain secondary to fall.  76 year old female presents to the ED with complaint of back pain.  Patient has history of chronic back pain however this morning when she was rolling out of bed  she fell landing on her buttocks which has caused an increase to her usual pain.  Patient has a known fracture to her in the lumbar region.  Patient also believes that osteoporosis has been mentioned in the past.  Lumbar spine x-ray shows L3 stable and healing.  Patient does have what appears to be a new compression fracture at T8 with 40% loss of height.  There is also possibility of a T10, T11 and T12 compression fractures.  Patient was made aware and already is established with an orthopedist.  Prior to x-rays she was given oxycodone 5 mg IR and has received some relief from this while in the ED.  Patient was up ambulatory to the bathroom with little to no  assistance.  A prescription for the same pain medication was sent to her pharmacy with instructions to take as needed but be aware that this could cause drowsiness and increase her risk for further injury.  She is  to call to follow-up with her orthopedist and had an appointment with physical therapy today which she will cancel. ,      Patient's presentation is most consistent with acute complicated illness / injury requiring diagnostic workup.  FINAL CLINICAL IMPRESSION(S) / ED DIAGNOSES   Final diagnoses:  Compression fracture of T8 vertebra, initial encounter ([place])  Compression fracture of thoracic vertebra, unspecified thoracic vertebral level, initial encounter ([place])     Rx / DC Orders   ED Discharge Orders          Ordered    oxyCODONE (OXY IR/ROXICODONE) 5 MG immediate release tablet        11/30/21 1153             Note:  This document was prepared using Dragon voice recognition software and may include unintentional dictation errors.   Johnn Hai, PA-C 11/30/21 1329    Blake Divine, MD 12/01/21 [phone]

## 2021-11-30 NOTE — ED Triage Notes (Signed)
Pt reports chronic back pain, has been hurting this time x 1 week, pain worsened this am after rolling out of bed and falling on rt lower/mid back.

## 2021-12-04 DIAGNOSIS — R000 Tachycardia, unspecified: Secondary | ICD-10-CM

## 2021-12-05 ENCOUNTER — Telehealth: Payer: Self-pay | Admitting: Internal Medicine

## 2021-12-05 NOTE — Telephone Encounter (Signed)
Pt called in stating that she was seen at the ER... Pt stated that the ER doctor requested her to have a follow up with provider asap... First available appt is on 01/02/2022 @ 10am... Pt requesting callback

## 2021-12-05 NOTE — Telephone Encounter (Signed)
Spoke with pt and scheduled her for an appt on Wednesday at 1pm. Pt is aware of appt date and time.

## 2021-12-06 ENCOUNTER — Encounter: Payer: Self-pay | Admitting: Internal Medicine

## 2021-12-07 ENCOUNTER — Ambulatory Visit (INDEPENDENT_AMBULATORY_CARE_PROVIDER_SITE_OTHER): Payer: Medicare Other | Admitting: Internal Medicine

## 2021-12-07 ENCOUNTER — Encounter: Payer: Self-pay | Admitting: Internal Medicine

## 2021-12-07 DIAGNOSIS — I700 Atherosclerosis of aorta: Secondary | ICD-10-CM

## 2021-12-07 DIAGNOSIS — I4819 Other persistent atrial fibrillation: Secondary | ICD-10-CM

## 2021-12-07 DIAGNOSIS — M8000XD Age-related osteoporosis with current pathological fracture, unspecified site, subsequent encounter for fracture with routine healing: Secondary | ICD-10-CM

## 2021-12-07 LAB — COMPREHENSIVE METABOLIC PANEL
ALT: 16 U/L (ref 0–35)
AST: 16 U/L (ref 0–37)
Albumin: 3.8 g/dL (ref 3.5–5.2)
Alkaline Phosphatase: 108 U/L (ref 39–117)
BUN: 15 mg/dL (ref 6–23)
CO2: 26 mEq/L (ref 19–32)
Calcium: 9.3 mg/dL (ref 8.4–10.5)
Chloride: 104 mEq/L (ref 96–112)
Creatinine, Ser: 0.76 mg/dL (ref 0.40–1.20)
GFR: 76.48 mL/min (ref >60.00)
Glucose, Bld: 88 mg/dL (ref 70–99)
Potassium: 4 mEq/L (ref 3.5–5.1)
Sodium: 135 mEq/L (ref 135–145)
Total Bilirubin: 0.4 mg/dL (ref 0.2–1.2)
Total Protein: 6.8 g/dL (ref 6.0–8.3)

## 2021-12-07 LAB — VITAMIN D 25 HYDROXY (VIT D DEFICIENCY, FRACTURES): VITD: 44.93 ng/mL (ref 30.00–100.00)

## 2021-12-07 MED ORDER — TRAMADOL HCL 50 MG PO TABS: 100.0000 mg | ORAL_TABLET | Freq: Four times a day (QID) | ORAL | 0 refills | Status: AC | PRN

## 2021-12-07 MED ORDER — TETANUS-DIPHTH-ACELL PERTUSSIS 5-2.5-18.5 LF-MCG/0.5 IM SUSP: 0.5000 mL | Freq: Once | INTRAMUSCULAR | 0 refills | Status: AC

## 2021-12-07 MED ORDER — ALENDRONATE SODIUM 70 MG PO TABS: 70.0000 mg | ORAL_TABLET | ORAL | 11 refills | Status: DC

## 2021-12-07 NOTE — Progress Notes (Signed)
Shingles vaccine added

## 2021-12-07 NOTE — Patient Instructions (Addendum)
Continue tramadol .  Dose is  100 mg ok to take  this amount  every 6 hours     ( max 8 daily ) plus tylenol 2000 mg daily in divided doses    We will start alendronate for osteoporosis  and try to get Prolia injection improved   (see below)  Start drinking soy milk to  help increase your calcium  intake   Goal 1800 mg daily   Continue vitamin D 1000 ius daily until you hear from me

## 2021-12-07 NOTE — Assessment & Plan Note (Addendum)
Now with several new thoracic vertebral fractures following a fall onto buttocks from her bed.  L3 fracture is old .  Starting alendronate, pending approval for Prolia.tramadol 100 mg every 6 hours prn pain

## 2021-12-07 NOTE — Progress Notes (Unsigned)
Subjective:  Patient ID: Melissa Bonilla, female    DOB: 1945/10/23  Age: 76 y.o. MRN: [id number]  CC: There were no encounter diagnoses.   HPI [name] presents for ER follow up after sustaining several thoracic vertebral fractures Chief Complaint  Patient presents with   Follow-up    ER follow up    Not tolerating oxycodone due to recurrent nausea.  Tolerating 100 mg tramadol which was prescribed    in April and since  then has reduced use of tylenol from 4000 mg daily to  1000 mg daily  Since then   Osteoporosis: diagnosed in Novembr by DEXA scan but has not been seen since then excpet fora acute issues.  Has been receivign steroin injections in right hip by orhtopedics,  last one May 2032.  Had an MRI in Feb (ordered by Orthopedics) showing an L3  fracture.    Outpatient Medications Prior to Visit  Medication Sig Dispense Refill   acetaminophen (TYLENOL) 650 MG CR tablet Take 1,300 mg by mouth every 8 (eight) hours as needed for pain.     ascorbic acid (VITAMIN C) 500 MG tablet Take 500 mg by mouth daily.     cholecalciferol (VITAMIN D) 25 MCG (1000 UNIT) tablet Take 1,000 Units by mouth daily.     Coenzyme Q10 (COQ10) 200 MG CAPS Take 1 capsule by mouth daily.     diltiazem (CARDIZEM CD) 180 MG 24 hr capsule Take 1 capsule (180 mg total) by mouth 2 (two) times daily. 90 capsule 3   ELIQUIS 5 MG TABS tablet TAKE 1 TABLET(5 MG) BY MOUTH TWICE DAILY 180 tablet 1   famciclovir (FAMVIR) 500 MG tablet Take 1 tablet (500 mg total) by mouth 3 (three) times daily. 21 tablet 0   gabapentin (NEURONTIN) 100 MG capsule Take 1 capsule (100 mg total) by mouth 3 (three) times daily. 90 capsule 3   losartan (COZAAR) 25 MG tablet TAKE 1/2 TABLET(12.5 MG) BY MOUTH DAILY 15 tablet 5   Magnesium 400 MG CAPS Take 1 capsule by mouth daily at 2 am.     Multiple Vitamin (MULTIVITAMIN WITH MINERALS) TABS tablet Take 1 tablet by mouth daily.     oxyCODONE (OXY IR/ROXICODONE) 5 MG immediate release  tablet 1 tablet every 6-8 hours prn pain 15 tablet 0   rosuvastatin (CRESTOR) 10 MG tablet Take 1 tablet (10 mg total) by mouth 2 (two) times a week. 24 tablet 3   vitamin B-12 (CYANOCOBALAMIN) 1000 MCG tablet Take 1,000 mcg by mouth daily.     No facility-administered medications prior to visit.    Review of Systems;  Patient denies headache, fevers, malaise, unintentional weight loss, skin rash, eye pain, sinus congestion and sinus pain, sore throat, dysphagia,  hemoptysis , cough, dyspnea, wheezing, chest pain, palpitations, orthopnea, edema, abdominal pain, nausea, melena, diarrhea, constipation, flank pain, dysuria, hematuria, urinary  Frequency, nocturia, numbness, tingling, seizures,  Focal weakness, Loss of consciousness,  Tremor, insomnia, depression, anxiety, and suicidal ideation.      Objective:  BP 126/70 (BP Location: Left Arm, Patient Position: Sitting, Cuff Size: Normal)   Pulse 72   Temp (!) 97.5 F (36.4 C) (Oral)   Ht 5' (1.524 m)   Wt 145 lb 9.6 oz (66 kg)   SpO2 94%   BMI 28.44 kg/m   BP Readings from Last 3 Encounters:  12/07/21 126/70  11/30/21 140/87  10/12/21 120/70    Wt Readings from Last 3 Encounters:  12/07/21 145  lb 9.6 oz (66 kg)  11/30/21 145 lb (65.8 kg)  10/12/21 145 lb (65.8 kg)    General appearance: alert, cooperative and appears stated age Ears: normal TM's and external ear canals both ears Throat: lips, mucosa, and tongue normal; teeth and gums normal Neck: no adenopathy, no carotid bruit, supple, symmetrical, trachea midline and thyroid not enlarged, symmetric, no tenderness/mass/nodules Back: symmetric, no curvature. ROM normal. No CVA tenderness. Lungs: clear to auscultation bilaterally Heart: regular rate and rhythm, S1, S2 normal, no murmur, click, rub or gallop Abdomen: soft, non-tender; bowel sounds normal; no masses,  no organomegaly Pulses: 2+ and symmetric Skin: Skin color, texture, turgor normal. No rashes or  lesions Lymph nodes: Cervical, supraclavicular, and axillary nodes normal.  No results found for: "HGBA1C"  Lab Results  Component Value Date   CREATININE 0.89 09/22/2021   CREATININE 0.90 09/20/2021   CREATININE 0.96 07/20/2021    Lab Results  Component Value Date   WBC 6.9 09/22/2021   HGB 14.6 09/22/2021   HCT 43.6 09/22/2021   PLT 233 09/22/2021   GLUCOSE 91 09/22/2021   CHOL 239 (H) 04/12/2021   TRIG 136.0 04/12/2021   HDL 51.40 04/12/2021   LDLDIRECT 175.5 03/19/2013   LDLCALC 160 (H) 04/12/2021   ALT 24 09/20/2021   AST 25 09/20/2021   NA 139 09/22/2021   K 5.0 09/22/2021   CL 110 09/22/2021   CREATININE 0.89 09/22/2021   BUN 19 09/22/2021   CO2 23 09/22/2021   TSH 2.809 05/10/2020   INR 1.3 (H) 11/26/2018    DG Lumbar Spine 2-3 Views  Result Date: 11/30/2021 CLINICAL DATA:  Back pain for 1 week. Worsening pain after falling today. EXAM: LUMBAR SPINE - 2-3 VIEW COMPARISON:  Lumbar MRI 08/03/2021 and abdominopelvic CT 07/20/2021. FINDINGS: There are 5 lumbar type vertebral bodies. Previously demonstrated superior endplate compression fracture at L3 appears stable. No acute lumbar spine fractures are identified. There are mild compression deformities within the lower thoracic spine which are further described on separate examination of the thoracic spine. The bones are diffusely demineralized. The alignment is normal. Aortic atherosclerosis noted. IMPRESSION: Stable healing superior endplate compression fracture at L3. No acute lumbar spine fractures identified. See separate thoracic spine report. Electronically Signed   By: Richardean Sale M.D.   On: 11/30/2021 10:52   DG Thoracic Spine 2 View  Result Date: 11/30/2021 CLINICAL DATA:  Back pain for 1 week. Worsening pain after falling today. EXAM: THORACIC SPINE 2 VIEWS COMPARISON:  Chest CTA 11/25/2018.  Cardiac CT 12/25/2019. FINDINGS: There are small ribs at T12 and 12 rib-bearing thoracic type vertebral bodies. The  bones are diffusely demineralized. There is a new biconcave compression fracture at T8 resulting in approximately 40% loss of vertebral body height. In addition, there are possible mild superior endplate compression fractures at T10, T11 and T12. No widening of the interpedicular distance. IMPRESSION: Several lower thoracic compression deformities appear new compared with available prior studies and could be acute/subacute. Electronically Signed   By: Richardean Sale M.D.   On: 11/30/2021 10:50    Assessment & Plan:   Problem List Items Addressed This Visit   None   I spent a total of   minutes with this patient in a face to face visit on the date of this encounter reviewing the last office visit with me on        ,  most recent with patient's cardiologist in    ,  patient'ss diet and eating  habits, home blood pressure readings ,  most recent imaging study ,   and post visit ordering of testing and therapeutics.    Follow-up: No follow-ups on file.   Crecencio Mc, MD

## 2021-12-09 NOTE — Assessment & Plan Note (Signed)
S/p cardioversion for atrial flutter in April .

## 2021-12-16 ENCOUNTER — Ambulatory Visit (INDEPENDENT_AMBULATORY_CARE_PROVIDER_SITE_OTHER): Payer: Medicare Other

## 2021-12-16 VITALS — Ht 61.0 in | Wt 145.0 lb

## 2021-12-16 DIAGNOSIS — Z0000 Encounter for general adult medical examination without abnormal findings: Secondary | ICD-10-CM | POA: Diagnosis not present

## 2021-12-16 NOTE — Patient Instructions (Addendum)
  Melissa Bonilla , Thank you for taking time to come for your Medicare Wellness Visit. I appreciate your ongoing commitment to your health goals. Please review the following plan we discussed and let me know if I can assist you in the future.   These are the goals we discussed:  Goals      Follow up with Primary Care Provider     As needed. Walking 10,000 steps daily        This is a list of the screening recommended for you and due dates:  Health Maintenance  Topic Date Due   COVID-19 Vaccine (3 - Moderna risk series) 12/22/2021*   Colon Cancer Screening  02/17/2022*   Flu Shot  01/17/2022   Tetanus Vaccine  12/17/2031   Pneumonia Vaccine  Completed   DEXA scan (bone density measurement)  Completed   Hepatitis C Screening: USPSTF Recommendation to screen - Ages 37-79 yo.  Completed   Zoster (Shingles) Vaccine  Completed   HPV Vaccine  Aged Out  *Topic was postponed. The date shown is not the original due date.

## 2021-12-16 NOTE — Progress Notes (Signed)
Subjective:   Melissa Bonilla is a 76 y.o. female who presents for Medicare Annual (Subsequent) preventive examination.  Review of Systems    No ROS.  Medicare Wellness Virtual Visit.  Visual/audio telehealth visit, UTA vital signs.   See social history for additional risk factors.   Cardiac Risk Factors include: advanced age (>67mn, >>60women)     Objective:    Today's Vitals   12/16/21 1307  Weight: 145 lb (65.8 kg)  Height: 5' 1"  (1.549 m)   Body mass index is 27.4 kg/m.     12/16/2021    1:05 PM 11/30/2021    8:32 AM 09/30/2021    9:03 AM 09/20/2021    4:47 PM 04/09/2020    5:57 AM 09/30/2019    7:41 AM 04/09/2019    1:15 PM  Advanced Directives  Does Patient Have a Medical Advance Directive? Yes No Yes Yes No No Yes  Type of Advance Directive Living will  Living will Living will   Living will  Does patient want to make changes to medical advance directive? No - Patient declined      No - Patient declined  Would patient like information on creating a medical advance directive?     No - Patient declined No - Patient declined    Current Medications (verified) Outpatient Encounter Medications as of 12/16/2021  Medication Sig   acetaminophen (TYLENOL) 650 MG CR tablet Take 1,300 mg by mouth every 8 (eight) hours as needed for pain.   alendronate (FOSAMAX) 70 MG tablet Take 1 tablet (70 mg total) by mouth every 7 (seven) days. Take with a full glass of water on an empty stomach.   ascorbic acid (VITAMIN C) 500 MG tablet Take 500 mg by mouth daily.   cholecalciferol (VITAMIN D) 25 MCG (1000 UNIT) tablet Take 1,000 Units by mouth daily.   Coenzyme Q10 (COQ10) 200 MG CAPS Take 1 capsule by mouth daily.   diltiazem (CARDIZEM CD) 180 MG 24 hr capsule Take 1 capsule (180 mg total) by mouth 2 (two) times daily.   ELIQUIS 5 MG TABS tablet TAKE 1 TABLET(5 MG) BY MOUTH TWICE DAILY   famciclovir (FAMVIR) 500 MG tablet Take 1 tablet (500 mg total) by mouth 3 (three) times daily.    losartan (COZAAR) 25 MG tablet TAKE 1/2 TABLET(12.5 MG) BY MOUTH DAILY   Magnesium 400 MG CAPS Take 1 capsule by mouth daily at 2 am.   Multiple Vitamin (MULTIVITAMIN WITH MINERALS) TABS tablet Take 1 tablet by mouth daily.   rosuvastatin (CRESTOR) 10 MG tablet Take 1 tablet (10 mg total) by mouth 2 (two) times a week. (Patient not taking: Reported on 12/16/2021)   traMADol (ULTRAM) 50 MG tablet Take 2 tablets (100 mg total) by mouth every 6 (six) hours as needed.   vitamin B-12 (CYANOCOBALAMIN) 1000 MCG tablet Take 1,000 mcg by mouth daily.   No facility-administered encounter medications on file as of 12/16/2021.   Allergies (verified) Codeine, Morphine, and Sulfa antibiotics   History: Past Medical History:  Diagnosis Date   Arrhythmia    atrial fibrillation   CHF (congestive heart failure) (HCC)    Depression    Hyperlipidemia    Hypertension    Menopausal syndrome (hot flashes) 1992   used HRT fo 2 yrs    Obstructive sleep apnea    Persistent atrial fibrillation ([place]    Past Surgical History:  Procedure Laterality Date   APPENDECTOMY  1972   ATRIAL FIBRILLATION ABLATION N/A  04/09/2020   Procedure: ATRIAL FIBRILLATION ABLATION;  Surgeon: Vickie Epley, MD;  Location: [facility];  Service: Cardiovascular;  Laterality: N/A;   CARDIOVERSION N/A 11/28/2018   Procedure: CARDIOVERSION;  Surgeon: [name], MD;  Location: [facility];  Service: Cardiovascular;  Laterality: N/A;   CARDIOVERSION N/A 12/23/2018   Procedure: CARDIOVERSION;  Surgeon: [name], MD;  Location: [facility];  Service: Cardiovascular;  Laterality: N/A;   CARDIOVERSION N/A 09/30/2019   Procedure: CARDIOVERSION;  Surgeon: Nelva Bush, MD;  Location: [facility];  Service: Cardiovascular;  Laterality: N/A;   CARDIOVERSION N/A 09/30/2021   Procedure: CARDIOVERSION;  Surgeon: Sueanne Margarita, MD;  Location: [facility];  Service: Cardiovascular;  Laterality: N/A;   CESAREAN SECTION      [id number], 1972   TEE WITHOUT CARDIOVERSION N/A 11/28/2018   Procedure: TRANSESOPHAGEAL ECHOCARDIOGRAM (TEE);  Surgeon: [name], MD;  Location: [facility];  Service: Cardiovascular;  Laterality: N/A;   Family History  Problem Relation Age of Onset   Arthritis Mother        Rheumatoid   Heart disease Mother        Valvular   Hypertension Mother    Heart disease Father 37       CABG   Arthritis Maternal Grandmother        Rheumatoid   Hypertension Maternal Grandmother    Stroke Maternal Grandmother    Breast cancer Paternal Aunt 64   Breast cancer Paternal Aunt 68   Social History   Socioeconomic History   Marital status: Married    Spouse name: Not on file   Number of children: Not on file   Years of education: Not on file   Highest education level: Not on file  Occupational History    Employer: self    Comment: Owner of a cleaning Business  Tobacco Use   Smoking status: Former    Packs/day: 0.25    Years: 15.00    Total pack years: 3.75    Types: Cigarettes    Quit date: 1992    Years since quitting: 31.5   Smokeless tobacco: Never   Tobacco comments:    Former smoker 09/22/21  Vaping Use   Vaping Use: Never used  Substance and Sexual Activity   Alcohol use: Not Currently    Comment: 1/2 glass of wine on occassion   Drug use: No   Sexual activity: Not on file  Other Topics Concern   Not on file  Social History Narrative   Not on file   Social Determinants of Health   Financial Resource Strain: Low Risk  (12/16/2021)   Overall Financial Resource Strain (CARDIA)    Difficulty of Paying Living Expenses: Not hard at all  Food Insecurity: No Food Insecurity (12/16/2021)   Hunger Vital Sign    Worried About Running Out of Food in the Last Year: Never true    [place] in the Last Year: Never true  Transportation Needs: No Transportation Needs (12/16/2021)   PRAPARE - Hydrologist (Medical): No    Lack of Transportation  (Non-Medical): No  Physical Activity: Not on file  Stress: No Stress Concern Present (12/16/2021)   [place]    Feeling of Stress : Not at all  Social Connections: Unknown (12/16/2021)   Social Connection and Isolation Panel [NHANES]    Frequency of Communication with Friends and Family: Not on file  Frequency of Social Gatherings with Friends and Family: Not on file    Attends Religious Services: Not on file    Active Member of Clubs or Organizations: Not on file    Attends Archivist Meetings: Not on file    Marital Status: Married    Tobacco Counseling Counseling given: Not Answered Tobacco comments: Former smoker 09/22/21   Clinical Intake:  Pre-visit preparation completed: Yes        Diabetes: No  How often do you need to have someone help you when you read instructions, pamphlets, or other written materials from your doctor or pharmacy?: 1 - Never  Interpreter Needed?: No    Activities of Daily Living    12/16/2021    1:13 PM  In your present state of health, do you have any difficulty performing the following activities:  Hearing? 0  Vision? 0  Difficulty concentrating or making decisions? 0  Walking or climbing stairs? 0  Dressing or bathing? 0  Doing errands, shopping? 0  Preparing Food and eating ? N  Using the Toilet? N  In the past six months, have you accidently leaked urine? N  Do you have problems with loss of bowel control? N  Managing your Medications? N  Managing your Finances? N  Housekeeping or managing your Housekeeping? N   [name], MD as PCP - General (Internal Medicine) End, [name], MD as PCP - Cardiology (Cardiology) Vickie Epley, MD as PCP - Electrophysiology (Cardiology) [name], MD as Consulting Physician (Cardiology)  Indicate any recent Medical Services you may have received from other than [facility] providers  in the past year (date may be approximate).     Assessment:   This is a routine wellness examination for Melissa Bonilla.  Virtual Visit via Telephone Note  I connected with  Melissa Bonilla on 12/16/21 at  1:00 PM EDT by telephone and verified that I am speaking with the correct person using two identifiers.  Persons participating in the virtual visit: patient/Nurse Health Advisor   I discussed the limitations of performing an evaluation and management service by telehealth. We continued and completed visit with audio only. Some vital signs may be absent or patient reported.   Hearing/Vision screen Hearing Screening - Comments:: Patient is able to hear conversational tones without difficulty. No issues reported. Vision Screening - Comments:: Wears corrective lenses  Cataract extraction, bilateral   Dietary issues and exercise activities discussed: Current Exercise Habits: Home exercise routine, Type of exercise: walking ([place]), Intensity: Mild  Regular diet   Goals Addressed             This Visit's Progress    Follow up with Primary Care Provider       As needed. Walking 10,000 steps daily       Depression Screen    12/16/2021    1:09 PM 12/07/2021    1:12 PM 07/20/2021    3:41 PM 04/12/2021    1:57 PM 04/09/2019    1:17 PM 09/02/2015    9:53 AM 05/26/2013   11:44 AM  PHQ 2/9 Scores  PHQ - 2 Score 0 0 2 0 0 0 2  PHQ- 9 Score   4    6    Fall Risk    12/07/2021    1:12 PM 09/20/2021    3:33 PM 07/20/2021    3:41 PM 04/12/2021    1:57 PM 03/03/2020    1:06 PM  Fall Risk  Falls in the past year? 1 0 0 0 0  Number falls in past yr: 0  0  0  Injury with Fall? 0  0  0  Risk for fall due to : History of fall(s) No Fall Risks No Fall Risks No Fall Risks   Follow up Falls evaluation completed Falls evaluation completed Falls evaluation completed Falls evaluation completed Falls evaluation completed    [place]: Home free of loose throw  rugs in walkways, pet beds, electrical cords, etc? Yes  Adequate lighting in your home to reduce risk of falls? Yes   ASSISTIVE DEVICES UTILIZED TO PREVENT FALLS: Life alert? No  Use of a cane, walker or w/c? No   TIMED UP AND GO: Was the test performed? No .   Cognitive Function:  Patient is alert and oriented x3.       04/09/2019    1:24 PM  6CIT Screen  What Year? 0 points  What month? 0 points  What time? 0 points  Count back from 20 0 points  Months in reverse 0 points  Repeat phrase 0 points  Total Score 0 points    Immunizations Immunization History  Administered Date(s) Administered   Influenza Split 05/16/2013   Influenza,inj,Quad PF,6+ Mos 03/05/2015   Influenza-Unspecified 04/19/2012   Moderna Sars-Covid-2 Vaccination 01/08/2020, 04/03/2020   PNEUMOCOCCAL CONJUGATE-20 04/12/2021   Pneumococcal Conjugate-13 01/12/2014   Pneumococcal Polysaccharide-23 03/05/2015   Tdap 12/16/2021   Zoster Recombinat (Shingrix) 10/17/2021, 12/16/2021   Zoster, Live 11/21/2011   Screening Tests Health Maintenance  Topic Date Due   COVID-19 Vaccine (3 - Moderna risk series) 12/22/2021 (Originally 05/01/2020)   COLONOSCOPY (Pts 45-43yr Insurance coverage will need to be confirmed)  02/17/2022 (Originally 08/10/2021)   INFLUENZA VACCINE  01/17/2022   TETANUS/TDAP  12/17/2031   Pneumonia Vaccine 76 Years old  Completed   DEXA SCAN  Completed   Hepatitis C Screening  Completed   Zoster Vaccines- Shingrix  Completed   HPV VACCINES  Aged Out   Health Maintenance There are no preventive care reminders to display for this patient.  Colonoscopy- deferred.   Lung Cancer Screening: (Low Dose CT Chest recommended if Age 76-80years, 30 pack-year currently smoking OR have quit w/in 15years.) does not qualify.   Vision Screening: Recommended annual ophthalmology exams for early detection of glaucoma and other disorders of the eye.  Dental Screening: Recommended annual dental  exams for proper oral hygiene  Community Resource Referral / Chronic Care Management: CRR required this visit?  No   CCM required this visit?  No      Plan:   Keep all routine maintenance appointments.   I have personally reviewed and noted the following in the patient's chart:   Medical and social history Use of alcohol, tobacco or illicit drugs  Current medications and supplements including opioid prescriptions. Taking Tramadol, followed by PCP. Functional ability and status Nutritional status Physical activity Advanced directives List of other physicians Hospitalizations, surgeries, and ER visits in previous 12 months Vitals Screenings to include cognitive, depression, and falls Referrals and appointments  In addition, I have reviewed and discussed with patient certain preventive protocols, quality metrics, and best practice recommendations. A written personalized care plan for preventive services as well as general preventive health recommendations were provided to patient.     OVarney Biles LPN   [id number]

## 2021-12-23 ENCOUNTER — Other Ambulatory Visit: Payer: Self-pay | Admitting: Cardiology

## 2021-12-23 DIAGNOSIS — I4819 Other persistent atrial fibrillation: Secondary | ICD-10-CM

## 2021-12-23 NOTE — Telephone Encounter (Signed)
Prescription refill request for Eliquis received. Indication: Atrial Flutter Last office visit: 10/12/21  Grayce Sessions MD Scr: 0.76 on 12/07/21 Age:  76 Weight: 65.8kg  Based on above findings Eliquis '5mg'$  twice daily is the appropriate dose.  Refill approved.

## 2021-12-30 ENCOUNTER — Telehealth: Payer: Self-pay | Admitting: Cardiology

## 2021-12-30 NOTE — Telephone Encounter (Signed)
Reviewed message- unable to obtain the initial call as I was involved with testing with another patient.  Confirmed with scheduling that no attempt was made to contact another clinic RN.  I called [employer] and spoke with Tia. Per Tia:     Cardiac Monitor Alert  Date of alert:  12/30/2021   Patient Name: Melissa Bonilla  DOB: 15-May-1946  MRN: [id number]   [place] Cardiologist: Nelva Bush, MD  [facility] EP:  Vickie Epley, MD    Monitor Information: Long Term Monitor [ZioXT]  Reason:  Patient reported intermittent episodes of tachycardia in early June 2023. Ordering provider:  Quentin Ore   Alert Atrial Fibrillation/Flutter This is the 1st alert for this rhythm.  The patient has a hx of Atrial Fibrillation/Flutter.    Anticoagulation medication as of 12/30/2021           ELIQUIS 5 MG TABS tablet TAKE 1 TABLET(5 MG) BY MOUTH TWICE DAILY       Next Cardiology Appointment   Date:  no current appointment  Provider:  recalls pending for both Dr. Saunders Revel Dr. Quentin Ore for 03/2022.  I did not contact the patient as the reported event was: Notification for 12/17/21 @ 1349  - documented rapid atrial flutter at 196 bpm lasting 60 seconds   Other: The patient is currently on Eliquis 5 mg BID. She is s/p a-fib ablation on 04/09/20 with Dr. Quentin Ore.  Per the patient's last visit with Dr. Quentin Ore on 10/12/21: #Atrial flutter New arrhythmia since ablation.  During the ablation I performed a PVI and posterior wall isolation.  We discussed treatment options today for her atrial flutter including continued treatment with calcium channel blocker versus proceeding directly to ablation.  I think is very reasonable to wait to see how she does on calcium channel blocker and avoiding steroid injections.  Hopefully this was an isolated episode.  We discussed that if it recurs, would plan to proceed to repeat ablation.  During a repeat ablation, I would take care of the atrial flutter with  a CTI ablation and also recheck the pulmonary veins and posterior wall.  Continue Eliquis in the meantime.  To Dr. Maura Crandall, RN for further review.  Alvis Lemmings, RN  12/30/2021 10:57 AM

## 2021-12-30 NOTE — Telephone Encounter (Signed)
Jocelyn from [facility] calling on critical results for monitor on pt. Attempted to call triage 3x, no answer. Secure chat to Alvis Lemmings, however she was busy at the moment.    Best callback [phone]  [phone]

## 2021-12-31 DIAGNOSIS — H524 Presbyopia: Secondary | ICD-10-CM | POA: Diagnosis not present

## 2022-01-02 NOTE — Telephone Encounter (Signed)
Left message to call back.   Monitor shows fairly significant burden of atrial fibrillation/flutter. Would recommend redo ablation at this point like we discussed at our last appointment.  Can you help get her scheduled for a virtual appointment (or in-person if preferred) to discuss what she would like to do?  Thanks  Lars Mage

## 2022-01-03 NOTE — Telephone Encounter (Signed)
Patient is returning call.  °

## 2022-01-03 NOTE — Progress Notes (Unsigned)
Electrophysiology Office Follow up Visit Note:    Date:  01/04/2022   ID:  KHAYLA KOPPENHAVER, DOB 1945-07-14, MRN [id number]  PCP:  Crecencio Mc, MD  [facility] Cardiologist:  Nelva Bush, MD  [facility] Electrophysiologist:  Vickie Epley, MD    Interval History:    Melissa Bonilla is a 76 y.o. female who presents for a follow up visit.  She has persistent atrial fibrillation and underwent an atrial fibrillation ablation on April 09, 2020.  During that procedure, the veins and posterior wall were isolated.  There were no inducible arrhythmias during EP study at the end of that case.  I last saw her in follow-up on October 12, 2021.  That was after she had an episode of recurrent atrial fibrillation and flutter on September 20, 2021.  She had a cardioversion.  She wore a heart monitor which resulted yesterday and showed a 38% burden of atrial fibrillation and flutter.  She presents today to discuss management options.  She is with her husband today who I have previously met.  She has been quite symptomatic with her atrial fibrillation with decreased exercise tolerance and fatigue.  She was having some trouble with her back pain and was on pain pills every 6 hours as needed but she is recently stopped these because they are making her feel poorly.  She is trying to stay active with daily walks.     Past Medical History:  Diagnosis Date   Arrhythmia    atrial fibrillation   CHF (congestive heart failure) ([place])    Depression    Hyperlipidemia    Hypertension    Menopausal syndrome (hot flashes) 1992   used HRT fo 2 yrs    Obstructive sleep apnea    Persistent atrial fibrillation [facility])     Past Surgical History:  Procedure Laterality Date   APPENDECTOMY  1972   ATRIAL FIBRILLATION ABLATION N/A 04/09/2020   Procedure: ATRIAL FIBRILLATION ABLATION;  Surgeon: Vickie Epley, MD;  Location: [facility];  Service: Cardiovascular;  Laterality: N/A;   CARDIOVERSION  N/A 11/28/2018   Procedure: CARDIOVERSION;  Surgeon: [name], MD;  Location: [facility];  Service: Cardiovascular;  Laterality: N/A;   CARDIOVERSION N/A 12/23/2018   Procedure: CARDIOVERSION;  Surgeon: [name], MD;  Location: [facility];  Service: Cardiovascular;  Laterality: N/A;   CARDIOVERSION N/A 09/30/2019   Procedure: CARDIOVERSION;  Surgeon: Nelva Bush, MD;  Location: [facility];  Service: Cardiovascular;  Laterality: N/A;   CARDIOVERSION N/A 09/30/2021   Procedure: CARDIOVERSION;  Surgeon: Sueanne Margarita, MD;  Location: [facility];  Service: Cardiovascular;  Laterality: N/A;   CESAREAN SECTION     [id number], 1972   TEE WITHOUT CARDIOVERSION N/A 11/28/2018   Procedure: TRANSESOPHAGEAL ECHOCARDIOGRAM (TEE);  Surgeon: [name], MD;  Location: [facility];  Service: Cardiovascular;  Laterality: N/A;    Current Medications: Current Meds  Medication Sig   acetaminophen (TYLENOL) 650 MG CR tablet Take 1,300 mg by mouth every 8 (eight) hours as needed for pain.   alendronate (FOSAMAX) 70 MG tablet Take 1 tablet (70 mg total) by mouth every 7 (seven) days. Take with a full glass of water on an empty stomach.   ascorbic acid (VITAMIN C) 500 MG tablet Take 500 mg by mouth daily.   cholecalciferol (VITAMIN D) 25 MCG (1000 UNIT) tablet Take 1,000 Units by mouth daily.   Coenzyme Q10 (COQ10) 200 MG CAPS Take 1 capsule by mouth  daily.   diltiazem (CARDIZEM CD) 180 MG 24 hr capsule Take 1 capsule (180 mg total) by mouth 2 (two) times daily.   ELIQUIS 5 MG TABS tablet TAKE 1 TABLET(5 MG) BY MOUTH TWICE DAILY   famciclovir (FAMVIR) 500 MG tablet Take 1 tablet (500 mg total) by mouth 3 (three) times daily.   losartan (COZAAR) 25 MG tablet TAKE 1/2 TABLET(12.5 MG) BY MOUTH DAILY   Magnesium 400 MG CAPS Take 1 capsule by mouth daily at 2 am.   Multiple Vitamin (MULTIVITAMIN WITH MINERALS) TABS tablet Take 1 tablet by mouth daily.   rosuvastatin (CRESTOR) 10 MG tablet Take 1  tablet (10 mg total) by mouth 2 (two) times a week.   traMADol (ULTRAM) 50 MG tablet Take 2 tablets (100 mg total) by mouth every 6 (six) hours as needed.   vitamin B-12 (CYANOCOBALAMIN) 1000 MCG tablet Take 1,000 mcg by mouth daily.     Allergies:   Codeine, Morphine, and Sulfa antibiotics   Social History   Socioeconomic History   Marital status: Married    Spouse name: Not on file   Number of children: Not on file   Years of education: Not on file   Highest education level: Not on file  Occupational History    Employer: self    Comment: Owner of a cleaning Business  Tobacco Use   Smoking status: Former    Packs/day: 0.25    Years: 15.00    Total pack years: 3.75    Types: Cigarettes    Quit date: 1992    Years since quitting: 31.5   Smokeless tobacco: Never   Tobacco comments:    Former smoker 09/22/21  Vaping Use   Vaping Use: Never used  Substance and Sexual Activity   Alcohol use: Not Currently    Comment: 1/2 glass of wine on occassion   Drug use: No   Sexual activity: Not on file  Other Topics Concern   Not on file  Social History Narrative   Not on file   Social Determinants of Health   Financial Resource Strain: Low Risk  (12/16/2021)   Overall Financial Resource Strain (CARDIA)    Difficulty of Paying Living Expenses: Not hard at all  Food Insecurity: No Food Insecurity (12/16/2021)   Hunger Vital Sign    Worried About Running Out of Food in the Last Year: Never true    [place] in the Last Year: Never true  Transportation Needs: No Transportation Needs (12/16/2021)   PRAPARE - Hydrologist (Medical): No    Lack of Transportation (Non-Medical): No  Physical Activity: Not on file  Stress: No Stress Concern Present (12/16/2021)   [place]    Feeling of Stress : Not at all  Social Connections: Unknown (12/16/2021)   Social Connection and Isolation Panel  [NHANES]    Frequency of Communication with Friends and Family: Not on file    Frequency of Social Gatherings with Friends and Family: Not on file    Attends Religious Services: Not on file    Active Member of Clubs or Organizations: Not on file    Attends Archivist Meetings: Not on file    Marital Status: Married     Family History: The patient's family history includes Arthritis in her maternal grandmother and mother; Breast cancer (age of onset: 81) in her paternal aunt; Breast cancer (age of onset: 53) in her  paternal aunt; Heart disease in her mother; Heart disease (age of onset: 80) in her father; Hypertension in her maternal grandmother and mother; Stroke in her maternal grandmother.  ROS:   Please see the history of present illness.    All other systems reviewed and are negative.  EKGs/Labs/Other Studies Reviewed:    The following studies were reviewed today:      Recent Labs: 09/22/2021: Hemoglobin 14.6; Platelets 233 12/07/2021: ALT 16; BUN 15; Creatinine, Ser 0.76; Potassium 4.0; Sodium 135  Recent Lipid Panel    Component Value Date/Time   CHOL 239 (H) 04/12/2021 1434   TRIG 136.0 04/12/2021 1434   HDL 51.40 04/12/2021 1434   CHOLHDL 5 04/12/2021 1434   VLDL 27.2 04/12/2021 1434   LDLCALC 160 (H) 04/12/2021 1434   LDLDIRECT 175.5 03/19/2013 0930    Physical Exam:    VS:  BP 120/86 (BP Location: Left Arm, Patient Position: Sitting, Cuff Size: Normal)   Pulse (!) 139   Ht 5' (1.524 m)   Wt 143 lb (64.9 kg)   BMI 27.93 kg/m     Wt Readings from Last 3 Encounters:  01/04/22 143 lb (64.9 kg)  12/16/21 145 lb (65.8 kg)  12/07/21 145 lb 9.6 oz (66 kg)     GEN:  Well nourished, well developed in no acute distress HEENT: Normal NECK: No JVD; No carotid bruits LYMPHATICS: No lymphadenopathy CARDIAC: Irregularly irregular, tachycardic, no murmurs, rubs, gallops RESPIRATORY:  Clear to auscultation without rales, wheezing or rhonchi  ABDOMEN: Soft,  non-tender, non-distended MUSCULOSKELETAL:  No edema; No deformity  SKIN: Warm and dry NEUROLOGIC:  Alert and oriented x 3 PSYCHIATRIC:  Normal affect        ASSESSMENT:    1. Persistent atrial fibrillation ([place])   2. Atrial flutter with rapid ventricular response (HCC)    PLAN:    In order of problems listed above:  #Persistent atrial fibrillation and flutter Symptomatic.  The patient is now presented with atrial flutter which was not ablated during the first procedure.  Given the symptomatic nature of her arrhythmia, would favor repeat ablation.  This has been discussed with the patient.  Discussed treatment options today for his AF including antiarrhythmic drug therapy and ablation. Discussed risks, recovery and likelihood of success. Discussed potential need for repeat ablation procedures and antiarrhythmic drugs after an initial ablation. They wish to proceed with scheduling.  Risk, benefits, and alternatives to EP study and radiofrequency ablation for afib were also discussed in detail today. These risks include but are not limited to stroke, bleeding, vascular damage, tamponade, perforation, damage to the esophagus, lungs, and other structures, pulmonary vein stenosis, worsening renal function, and death. The patient understands these risk and wishes to proceed.  We will therefore proceed with catheter ablation at the next available time.  Carto, ICE, anesthesia are requested for the procedure.  Will also obtain CT PV protocol prior to the procedure to exclude LAA thrombus and further evaluate atrial anatomy.   Medication Adjustments/Labs and Tests Ordered: Current medicines are reviewed at length with the patient today.  Concerns regarding medicines are outlined above.  No orders of the defined types were placed in this encounter.  No orders of the defined types were placed in this encounter.    Signed, Lars Mage, MD, [place], [facility] 01/04/2022 9:08 AM     Electrophysiology [facility]

## 2022-01-03 NOTE — Telephone Encounter (Signed)
The patient has been notified of the result and verbalized understanding.  All questions (if any) were answered. Darrell Jewel, RN 01/03/2022 8:49 AM    Scheduled 7/19 at 8:40 am. Patient verbalized agreement with read back.

## 2022-01-04 ENCOUNTER — Encounter: Payer: Self-pay | Admitting: Cardiology

## 2022-01-04 ENCOUNTER — Ambulatory Visit: Payer: Medicare Other | Admitting: Cardiology

## 2022-01-04 VITALS — BP 120/86 | HR 139 | Ht 60.0 in | Wt 143.0 lb

## 2022-01-04 DIAGNOSIS — I4892 Unspecified atrial flutter: Secondary | ICD-10-CM | POA: Diagnosis not present

## 2022-01-04 DIAGNOSIS — I4819 Other persistent atrial fibrillation: Secondary | ICD-10-CM

## 2022-01-04 NOTE — Patient Instructions (Addendum)
Medications: Your physician recommends that you continue on your current medications as directed. Please refer to the Current Medication list given to you today. *If you need a refill on your cardiac medications before your next appointment, please call your pharmacy*  Lab Work: None. If you have labs (blood work) drawn today and your tests are completely normal, you will receive your results only by: [place] (if you have MyChart) OR A paper copy in the mail If you have any lab test that is abnormal or we need to change your treatment, we will call you to review the results.  Testing/Procedures: Your physician has requested that you have cardiac CT. Cardiac computed tomography (CT) is a painless test that uses an x-ray machine to take clear, detailed pictures of your heart. For further information please visit [email or web address].[email or web address]. Please follow instruction sheet as given.  Your physician has recommended that you have an ablation. Catheter ablation is a medical procedure used to treat some cardiac arrhythmias (irregular heartbeats). During catheter ablation, a long, thin, flexible tube is put into a blood vessel in your groin (upper thigh), or neck. This tube is called an ablation catheter. It is then guided to your heart through the blood vessel. Radio frequency waves destroy small areas of heart tissue where abnormal heartbeats may cause an arrhythmia to start. Please see the instruction sheet given to you today.   Follow-Up: At [facility], you and your health needs are our priority.  As part of our continuing mission to provide you with exceptional heart care, we have created designated Provider Care Teams.  These Care Teams include your primary Cardiologist (physician) and Advanced Practice Providers (APPs -  Physician Assistants and Nurse Practitioners) who all work together to provide you with the care you need, when you need it.  Your physician wants you to follow-up in:  Ablation date picked November 16 and pre op lab date picked Oct 24. Otila Kluver RN will call you will your CT and Ablation instructions.    We recommend signing up for the patient portal called "MyChart".  Sign up information is provided on this After Visit Summary.  MyChart is used to connect with patients for Virtual Visits (Telemedicine).  Patients are able to view lab/test results, encounter notes, upcoming appointments, etc.  Non-urgent messages can be sent to your provider as well.   To learn more about what you can do with MyChart, go to [email or web address].    Any Other Special Instructions Will Be Listed Below (If Applicable).  Cardiac Ablation Cardiac ablation is a procedure to destroy (ablate) some heart tissue that is sending bad signals. These bad signals cause problems in heart rhythm. The heart has many areas that make these signals. If there are problems in these areas, they can make the heart beat in a way that is not normal. Destroying some tissues can help make the heart rhythm normal. Tell your doctor about: Any allergies you have. All medicines you are taking. These include vitamins, herbs, eye drops, creams, and over-the-counter medicines. Any problems you or family members have had with medicines that make you fall asleep (anesthetics). Any blood disorders you have. Any surgeries you have had. Any medical conditions you have, such as kidney failure. Whether you are pregnant or may be pregnant. What are the risks? This is a safe procedure. But problems may occur, including: Infection. Bruising and bleeding. Bleeding into the chest. Stroke or blood clots. Damage to nearby areas of your body. Allergies to medicines or dyes.  The need for a pacemaker if the normal system is damaged. Failure of the procedure to treat the problem. What happens before the procedure? Medicines Ask your doctor about: Changing or stopping your normal medicines. This is important. Taking  aspirin and ibuprofen. Do not take these medicines unless your doctor tells you to take them. Taking other medicines, vitamins, herbs, and supplements. General instructions Follow instructions from your doctor about what you cannot eat or drink. Plan to have someone take you home from the hospital or clinic. If you will be going home right after the procedure, plan to have someone with you for 24 hours. Ask your doctor what steps will be taken to prevent infection. What happens during the procedure?  An IV tube will be put into one of your veins. You will be given a medicine to help you relax. The skin on your neck or groin will be numbed. A cut (incision) will be made in your neck or groin. A needle will be put through your cut and into a large vein. A tube (catheter) will be put into the needle. The tube will be moved to your heart. Dye may be put through the tube. This helps your doctor see your heart. Small devices (electrodes) on the tube will send out signals. A type of energy will be used to destroy some heart tissue. The tube will be taken out. Pressure will be held on your cut. This helps stop bleeding. A bandage will be put over your cut. The exact procedure may vary among doctors and hospitals. What happens after the procedure? You will be watched until you leave the hospital or clinic. This includes checking your heart rate, breathing rate, oxygen, and blood pressure. Your cut will be watched for bleeding. You will need to lie still for a few hours. Do not drive for 24 hours or as long as your doctor tells you. Summary Cardiac ablation is a procedure to destroy some heart tissue. This is done to treat heart rhythm problems. Tell your doctor about any medical conditions you may have. Tell him or her about all medicines you are taking to treat them. This is a safe procedure. But problems may occur. These include infection, bruising, bleeding, and damage to nearby areas of your  body. Follow what your doctor tells you about food and drink. You may also be told to change or stop some of your medicines. After the procedure, do not drive for 24 hours or as long as your doctor tells you. This information is not intended to replace advice given to you by your health care provider. Make sure you discuss any questions you have with your health care provider. Document Revised: 05/08/2019 Document Reviewed: 05/08/2019 Elsevier Patient Education  [employer].

## 2022-01-18 ENCOUNTER — Encounter: Payer: Self-pay | Admitting: *Deleted

## 2022-01-18 ENCOUNTER — Telehealth: Payer: Self-pay | Admitting: *Deleted

## 2022-01-18 DIAGNOSIS — Z01818 Encounter for other preprocedural examination: Secondary | ICD-10-CM

## 2022-01-18 DIAGNOSIS — I4819 Other persistent atrial fibrillation: Secondary | ICD-10-CM

## 2022-01-18 MED ORDER — METOPROLOL TARTRATE 25 MG PO TABS: 25.0000 mg | ORAL_TABLET | Freq: Once | ORAL | 0 refills | Status: DC | PRN

## 2022-01-19 ENCOUNTER — Other Ambulatory Visit: Payer: Self-pay | Admitting: Cardiology

## 2022-02-21 NOTE — Telephone Encounter (Signed)
Afib ablation work up

## 2022-02-27 ENCOUNTER — Telehealth: Payer: Self-pay | Admitting: Cardiology

## 2022-02-27 NOTE — Telephone Encounter (Signed)
New Message:      Patient says she is supposed to have an Ablation on 03-27-22.  She is concerned because she says her heart rate have been dropping to 68.   STAT if HR is under 50 or over 120 (normal HR is 60-100 beats per minute)  What is your heart rate? 68, not going above 80- before this it was high as 150 and 152  Do you have a log of your heart rate readings (document readings)?   Do you have any other symptoms?  Lightheaded , when it it gets low

## 2022-02-27 NOTE — Telephone Encounter (Signed)
Advised the patient that her heart rate was looking great. After further discussion she is taking a new pain mediation for her back, determined this is probably the cause for her lightheadedness. Will continue with ablation in Oct as planned. Patient verbalized understanding and agreement.

## 2022-03-02 DIAGNOSIS — L821 Other seborrheic keratosis: Secondary | ICD-10-CM | POA: Diagnosis not present

## 2022-03-02 DIAGNOSIS — Z08 Encounter for follow-up examination after completed treatment for malignant neoplasm: Secondary | ICD-10-CM | POA: Diagnosis not present

## 2022-03-02 DIAGNOSIS — L570 Actinic keratosis: Secondary | ICD-10-CM | POA: Diagnosis not present

## 2022-03-02 DIAGNOSIS — Z85828 Personal history of other malignant neoplasm of skin: Secondary | ICD-10-CM | POA: Diagnosis not present

## 2022-03-02 DIAGNOSIS — D229 Melanocytic nevi, unspecified: Secondary | ICD-10-CM | POA: Diagnosis not present

## 2022-03-06 ENCOUNTER — Ambulatory Visit: Payer: Medicare Other | Attending: Cardiology

## 2022-03-06 DIAGNOSIS — Z01818 Encounter for other preprocedural examination: Secondary | ICD-10-CM | POA: Diagnosis not present

## 2022-03-06 DIAGNOSIS — I4819 Other persistent atrial fibrillation: Secondary | ICD-10-CM

## 2022-03-07 LAB — BASIC METABOLIC PANEL
BUN/Creatinine Ratio: 22 (ref 12–28)
BUN: 17 mg/dL (ref 8–27)
CO2: 26 mmol/L (ref 20–29)
Calcium: 8.9 mg/dL (ref 8.7–10.3)
Chloride: 102 mmol/L (ref 96–106)
Creatinine, Ser: 0.79 mg/dL (ref 0.57–1.00)
Glucose: 88 mg/dL (ref 70–99)
Potassium: 4.5 mmol/L (ref 3.5–5.2)
Sodium: 140 mmol/L (ref 134–144)
eGFR: 78 mL/min/{1.73_m2} (ref >59)

## 2022-03-07 LAB — CBC WITH DIFFERENTIAL/PLATELET
Basophils Absolute: 0.1 10*3/uL (ref 0.0–0.2)
Basos: 1 %
EOS (ABSOLUTE): 0.3 10*3/uL (ref 0.0–0.4)
Eos: 4 %
Hematocrit: 43.6 % (ref 34.0–46.6)
Hemoglobin: 14.8 g/dL (ref 11.1–15.9)
Immature Grans (Abs): 0 10*3/uL (ref 0.0–0.1)
Immature Granulocytes: 0 %
Lymphocytes Absolute: 2 10*3/uL (ref 0.7–3.1)
Lymphs: 31 %
MCH: 31.2 pg (ref 26.6–33.0)
MCHC: 33.9 g/dL (ref 31.5–35.7)
MCV: 92 fL (ref 79–97)
Monocytes Absolute: 0.7 10*3/uL (ref 0.1–0.9)
Monocytes: 11 %
Neutrophils Absolute: 3.3 10*3/uL (ref 1.4–7.0)
Neutrophils: 53 %
Platelets: 228 10*3/uL (ref 150–450)
RBC: 4.75 x10E6/uL (ref 3.77–5.28)
RDW: 12.5 % (ref 11.7–15.4)
WBC: 6.3 10*3/uL (ref 3.4–10.8)

## 2022-03-13 ENCOUNTER — Encounter: Payer: Self-pay | Admitting: Internal Medicine

## 2022-03-13 ENCOUNTER — Ambulatory Visit (INDEPENDENT_AMBULATORY_CARE_PROVIDER_SITE_OTHER): Payer: Medicare Other | Admitting: Internal Medicine

## 2022-03-13 VITALS — BP 126/70 | HR 69 | Temp 97.9°F | Ht 60.0 in | Wt 146.6 lb

## 2022-03-13 DIAGNOSIS — Z8679 Personal history of other diseases of the circulatory system: Secondary | ICD-10-CM | POA: Diagnosis not present

## 2022-03-13 DIAGNOSIS — Z9989 Dependence on other enabling machines and devices: Secondary | ICD-10-CM

## 2022-03-13 DIAGNOSIS — G4733 Obstructive sleep apnea (adult) (pediatric): Secondary | ICD-10-CM

## 2022-03-13 DIAGNOSIS — I10 Essential (primary) hypertension: Secondary | ICD-10-CM

## 2022-03-13 DIAGNOSIS — M8000XD Age-related osteoporosis with current pathological fracture, unspecified site, subsequent encounter for fracture with routine healing: Secondary | ICD-10-CM | POA: Diagnosis not present

## 2022-03-13 MED ORDER — RSVPREF3 VAC RECOMB ADJUVANTED 120 MCG/0.5ML IM SUSR: 0.5000 mL | Freq: Once | INTRAMUSCULAR | 0 refills | Status: AC

## 2022-03-13 NOTE — Assessment & Plan Note (Signed)
S/p cardioversion for atrial flutter in April . She has been in sinus rhythm for the past several weeks based on lack of symptoms and lack of evidence on her Apple watch.  She is scheduled for an ablation Oct 8 and is apprehensive about having the procedure for reasons which include an intolerance to supine positioning since her multiple thoracic vertebral fractures occurred in June.  Will discuss with Dr Quentin Ore.

## 2022-03-13 NOTE — Patient Instructions (Addendum)
Increase your dose of Vitamin D  to 4000  utnis daily from November through April,  then reduce dose to 2000 IUs daily   We will repeat your DEXA I n June 2024   I'll let Dr Quentin Ore know that you have been in sinus rhythm   Referral to someone at [facility] to manage your sleep apnea

## 2022-03-13 NOTE — Progress Notes (Signed)
Subjective:  Patient ID: [name], female    DOB: [birth year]  Age: 76 y.o. MRN: [id number]  CC: The primary encounter diagnosis was OSA on CPAP. Diagnoses of Age-related osteoporosis with current pathological fracture with routine healing, subsequent encounter, Essential hypertension, and History of atrial fibrillation were also pertinent to this visit.   HPI BRYNA RAZAVI presents for  Chief Complaint  Patient presents with   Follow-up    3 month follow up on starting fosamax for Osteoporosis   1) Osteoporosis :  she has been taking weekly alendronate since suffering multiple new vertebral fractures in Mid June e  (T 8., possibly also involving   T10, T 11, and 12) . She states that her pain is significantly reduced compared to 3 months ago,  no long using opioids/analogs, using only tylenol . However she has been unable to tolrate supine positioning since the fall.  This has resulted in nonuse of her CPAP full face mask for management of OSA (last sleep study 2020).  She is taking 2000 IUs of vitamin D daily   2) PAF:   Her rate has  been controlled since July and she is apprehensive about the planned ablation procedure scheduled by Dr. Quentin Ore Oct 9 . Her watch has not detected any arrhythmias  the  last 3 weeks and she is asymptomatic .    3) She has  OSA which is currently untreated due to intolerance of supine position which is necessary for use of her full face mask . Last study ws in 2020 (in chart) . Dr Ashby Dawes, her previous provider has left [place] and she has not had follow up with anyone since his departure.    Outpatient Medications Prior to Visit  Medication Sig Dispense Refill   acetaminophen (TYLENOL) 650 MG CR tablet Take 1,300 mg by mouth every 8 (eight) hours as needed for pain.     alendronate (FOSAMAX) 70 MG tablet Take 1 tablet (70 mg total) by mouth every 7 (seven) days. Take with a full glass of water on an empty stomach. 4 tablet 11   ascorbic acid  (VITAMIN C) 500 MG tablet Take 500 mg by mouth daily.     cholecalciferol (VITAMIN D) 25 MCG (1000 UNIT) tablet Take 1,000 Units by mouth daily.     Coenzyme Q10 (COQ10) 200 MG CAPS Take 1 capsule by mouth daily.     diltiazem (CARDIZEM CD) 180 MG 24 hr capsule Take 1 capsule (180 mg total) by mouth 2 (two) times daily. 90 capsule 3   ELIQUIS 5 MG TABS tablet TAKE 1 TABLET(5 MG) BY MOUTH TWICE DAILY 180 tablet 1   famciclovir (FAMVIR) 500 MG tablet Take 1 tablet (500 mg total) by mouth 3 (three) times daily. 21 tablet 0   losartan (COZAAR) 25 MG tablet TAKE 1/2 TABLET(12.5 MG) BY MOUTH DAILY 15 tablet 5   Magnesium 400 MG CAPS Take 1 capsule by mouth daily at 2 am.     metoprolol tartrate (LOPRESSOR) 25 MG tablet Take 1 tablet (25 mg total) by mouth Once PRN for up to 1 dose (Take two hours prior to CT scan, IF your heart rate is over 80.). 1 tablet 0   Multiple Vitamin (MULTIVITAMIN WITH MINERALS) TABS tablet Take 1 tablet by mouth daily.     rosuvastatin (CRESTOR) 10 MG tablet Take 1 tablet (10 mg total) by mouth 2 (two) times a week. 24 tablet 3   vitamin B-12 (CYANOCOBALAMIN) 1000 MCG tablet Take  1,000 mcg by mouth daily.     No facility-administered medications prior to visit.    Review of Systems;  Patient denies headache, fevers, malaise, unintentional weight loss, skin rash, eye pain, sinus congestion and sinus pain, sore throat, dysphagia,  hemoptysis , cough, dyspnea, wheezing, chest pain, palpitations, orthopnea, edema, abdominal pain, nausea, melena, diarrhea, constipation, flank pain, dysuria, hematuria, urinary  Frequency, nocturia, numbness, tingling, seizures,  Focal weakness, Loss of consciousness,  Tremor, insomnia, depression, anxiety, and suicidal ideation.      Objective:  BP 126/70 (BP Location: Left Arm, Patient Position: Sitting, Cuff Size: Normal)   Pulse 69   Temp 97.9 F (36.6 C) (Oral)   Ht 5' (1.524 m)   Wt 146 lb 9.6 oz (66.5 kg)   SpO2 94%   BMI 28.63  kg/m   BP Readings from Last 3 Encounters:  03/13/22 126/70  01/04/22 120/86  12/07/21 126/70    Wt Readings from Last 3 Encounters:  03/13/22 146 lb 9.6 oz (66.5 kg)  01/04/22 143 lb (64.9 kg)  12/16/21 145 lb (65.8 kg)    General appearance: alert, cooperative and appears stated age Ears: normal TM's and external ear canals both ears Throat: lips, mucosa, and tongue normal; teeth and gums normal Neck: no adenopathy, no carotid bruit, supple, symmetrical, trachea midline and thyroid not enlarged, symmetric, no tenderness/mass/nodules Back: symmetric, no curvature. ROM normal. No CVA tenderness. Lungs: clear to auscultation bilaterally Heart: regular rate and rhythm, S1, S2 normal, no murmur, click, rub or gallop Abdomen: soft, non-tender; bowel sounds normal; no masses,  no organomegaly Pulses: 2+ and symmetric Skin: Skin color, texture, turgor normal. No rashes or lesions Lymph nodes: Cervical, supraclavicular, and axillary nodes normal. Neuro:  awake and interactive with normal mood and affect. Higher cortical functions are normal. Speech is clear without word-finding difficulty or dysarthria. Extraocular movements are intact. Visual fields of both eyes are grossly intact. Sensation to light touch is grossly intact bilaterally of upper and lower extremities. Motor examination shows 4+/5 symmetric hand grip and upper extremity and 5/5 lower extremity strength. There is no pronation or drift. Gait is non-ataxic   No results found for: "HGBA1C"  Lab Results  Component Value Date   CREATININE 0.79 03/06/2022   CREATININE 0.76 12/07/2021   CREATININE 0.89 09/22/2021    Lab Results  Component Value Date   WBC 6.3 03/06/2022   HGB 14.8 03/06/2022   HCT 43.6 03/06/2022   PLT 228 03/06/2022   GLUCOSE 88 03/06/2022   CHOL 239 (H) 04/12/2021   TRIG 136.0 04/12/2021   HDL 51.40 04/12/2021   LDLDIRECT 175.5 03/19/2013   LDLCALC 160 (H) 04/12/2021   ALT 16 12/07/2021   AST 16  12/07/2021   NA 140 03/06/2022   K 4.5 03/06/2022   CL 102 03/06/2022   CREATININE 0.79 03/06/2022   BUN 17 03/06/2022   CO2 26 03/06/2022   TSH 2.809 05/10/2020   INR 1.3 (H) 11/26/2018    DG Lumbar Spine 2-3 Views  Result Date: 11/30/2021 CLINICAL DATA:  Back pain for 1 week. Worsening pain after falling today. EXAM: LUMBAR SPINE - 2-3 VIEW COMPARISON:  Lumbar MRI 08/03/2021 and abdominopelvic CT 07/20/2021. FINDINGS: There are 5 lumbar type vertebral bodies. Previously demonstrated superior endplate compression fracture at L3 appears stable. No acute lumbar spine fractures are identified. There are mild compression deformities within the lower thoracic spine which are further described on separate examination of the thoracic spine. The bones are diffusely demineralized. The  alignment is normal. Aortic atherosclerosis noted. IMPRESSION: Stable healing superior endplate compression fracture at L3. No acute lumbar spine fractures identified. See separate thoracic spine report. Electronically Signed   By: Richardean Sale M.D.   On: 11/30/2021 10:52   DG Thoracic Spine 2 View  Result Date: 11/30/2021 CLINICAL DATA:  Back pain for 1 week. Worsening pain after falling today. EXAM: THORACIC SPINE 2 VIEWS COMPARISON:  Chest CTA 11/25/2018.  Cardiac CT 12/25/2019. FINDINGS: There are small ribs at T12 and 12 rib-bearing thoracic type vertebral bodies. The bones are diffusely demineralized. There is a new biconcave compression fracture at T8 resulting in approximately 40% loss of vertebral body height. In addition, there are possible mild superior endplate compression fractures at T10, T11 and T12. No widening of the interpedicular distance. IMPRESSION: Several lower thoracic compression deformities appear new compared with available prior studies and could be acute/subacute. Electronically Signed   By: Richardean Sale M.D.   On: 11/30/2021 10:50    Assessment & Plan:   Problem List Items Addressed  This Visit     Osteoporosis    Continue alendronate , started in June 2023 after she sustained 4 thoracic vertebral fractures (T8 with 40% reduction in height).  Repeat DEXA in June 2024.  Continue calcium supplementation and vitamin D supplementation for chronically low D      History of atrial fibrillation    S/p cardioversion for atrial flutter in April . She has been in sinus rhythm for the past several weeks based on lack of symptoms and lack of evidence on her Apple watch.  She is scheduled for an ablation Oct 8 and is apprehensive about having the procedure for reasons which include an intolerance to supine positioning since her multiple thoracic vertebral fractures occurred in June.  Will discuss with Dr Quentin Ore.       Essential hypertension    Well controlled on current regimen of losartan, metoprolol and diltiazem. Renal function stable, no changes today.      Other Visit Diagnoses     OSA on CPAP    -  Primary   Relevant Orders   Ambulatory referral to Pulmonology       I spent a total of 40 minutes with this patient in a face to face visit on the date of this encounter reviewing the last office visit with me in  June,  most recent visit with cardiology ,   , home blood pressure /pulse readings, recent ER visit including labs and imaging studies ,   and post visit ordering of testing and therapeutics.    Follow-up: Return in about 6 months (around 09/11/2022).   Crecencio Mc, MD

## 2022-03-13 NOTE — Assessment & Plan Note (Signed)
Well controlled on current regimen of losartan, metoprolol and diltiazem. Renal function stable, no changes today.

## 2022-03-13 NOTE — Assessment & Plan Note (Signed)
Continue alendronate , started in June 2023 after she sustained 4 thoracic vertebral fractures (T8 with 40% reduction in height).  Repeat DEXA in June 2024.  Continue calcium supplementation and vitamin D supplementation for chronically low D

## 2022-03-15 ENCOUNTER — Telehealth: Payer: Self-pay | Admitting: Cardiology

## 2022-03-15 NOTE — Telephone Encounter (Signed)
Called patient and discussed Dr. Lupita Dawn office visit note with the patients multiple vertebral fractures. Canceled procedure at this time and pre testing and follow ups. Will have patient follow up in 3 months with an APP to see how her back is healing and if rescheduling procedure is an option at that time.

## 2022-03-15 NOTE — Telephone Encounter (Signed)
Patient calling because she states that Dr. Derrel Nip was suppose to get in contact with our office. To let us know that she is unable to lay on her back. Seeing what her other options are. Please advise

## 2022-03-20 ENCOUNTER — Ambulatory Visit (HOSPITAL_BASED_OUTPATIENT_CLINIC_OR_DEPARTMENT_OTHER): Payer: Medicare Other

## 2022-03-27 ENCOUNTER — Ambulatory Visit (HOSPITAL_COMMUNITY): Admission: RE | Admit: 2022-03-27 | Payer: Medicare Other | Source: Ambulatory Visit | Admitting: Cardiology

## 2022-03-27 ENCOUNTER — Encounter (HOSPITAL_COMMUNITY): Admission: RE | Payer: Self-pay | Source: Ambulatory Visit

## 2022-03-27 SURGERY — ATRIAL FIBRILLATION ABLATION
Anesthesia: General

## 2022-03-30 ENCOUNTER — Other Ambulatory Visit: Payer: Self-pay

## 2022-03-30 MED ORDER — DILTIAZEM HCL ER COATED BEADS 180 MG PO CP24: 180.0000 mg | ORAL_CAPSULE | Freq: Two times a day (BID) | ORAL | 2 refills | Status: DC

## 2022-04-24 ENCOUNTER — Ambulatory Visit (HOSPITAL_COMMUNITY): Payer: Medicare Other | Admitting: Physician Assistant

## 2022-04-26 ENCOUNTER — Other Ambulatory Visit: Payer: Self-pay

## 2022-04-26 ENCOUNTER — Ambulatory Visit (INDEPENDENT_AMBULATORY_CARE_PROVIDER_SITE_OTHER): Payer: Medicare Other | Admitting: Internal Medicine

## 2022-04-26 ENCOUNTER — Encounter: Payer: Self-pay | Admitting: Internal Medicine

## 2022-04-26 VITALS — BP 118/70 | HR 83 | Temp 97.8°F | Ht 60.0 in | Wt 147.8 lb

## 2022-04-26 DIAGNOSIS — G4733 Obstructive sleep apnea (adult) (pediatric): Secondary | ICD-10-CM

## 2022-04-26 MED ORDER — ROSUVASTATIN CALCIUM 10 MG PO TABS: 10.0000 mg | ORAL_TABLET | ORAL | 3 refills | Status: DC

## 2022-04-26 NOTE — Patient Instructions (Addendum)
Continue CPAP as prescribed AFTER MASK FITTING REFERRAL FOLLOW UP WITH CARDIOLOGY

## 2022-04-26 NOTE — Progress Notes (Signed)
[facility] Consultation    Melissa Bonilla is a 76 y.o. old female with a history of atrial fibrillation as well as heart failure with reduced ejection fraction. She was in the hospital in June with Afib, acute CHF and pneumonia. She would notice at that time she would wake herself up from sleep.  She falls asleep for 30 min if she sits down to read or watching tv late night.  No signs of compliant compression fracture has significantly hindered her therapy for sleep apnea    Date: 04/26/2022  MRN# [id number] Melissa Bonilla [birth year]  **CT chest 11/25/2018>> imaging personally reviewed, there is a bilateral groundglass changes, suggestive of pneumonitis or pulmonary vascular edema. **Chest x-ray 11/18/2018>> imaging personally reviewed, mild pulmonary vascular edema **Echocardiogram transesophageal 11/28/2018>> EF is 40% **Echocardiogram 11/26/2018>> EF is 35%.  Sleep study 02/07/19 Findings shows AHI 16  moderate OSA Diagnosis  CC Follow up OSA   HPI:   Patient has been noncompliant with CPAP therapy due to to compression fractures of her back and cannot lay still Fullface mask is not ideal at this time Patient requesting another mask so I have suggested a referral to mask fitting desensitization program in [place] Patient has a history of A-fib a flutter and also could not undergo ablation therapy due to her back pain  Prior to her compression fracture in 2022 patient has been very compliant with her CPAP machine and had excellent compliance report    No exacerbation at this time No evidence of heart failure at this time No evidence or signs of infection at this time No respiratory distress No fevers, chills, nausea, vomiting, diarrhea No evidence of lower extremity edema No evidence hemoptysis     PMHX:   Past Medical History:  Diagnosis Date   Arrhythmia    atrial fibrillation   CHF (congestive heart failure) ([place])    Depression     Hyperlipidemia    Hypertension    Menopausal syndrome (hot flashes) 1992   used HRT fo 2 yrs    Obstructive sleep apnea    Persistent atrial fibrillation ([place])    Shingles rash 09/20/2021   Surgical Hx:  Past Surgical History:  Procedure Laterality Date   APPENDECTOMY  1972   ATRIAL FIBRILLATION ABLATION N/A 04/09/2020   Procedure: ATRIAL FIBRILLATION ABLATION;  Surgeon: Vickie Epley, MD;  Location: [facility];  Service: Cardiovascular;  Laterality: N/A;   CARDIOVERSION N/A 11/28/2018   Procedure: CARDIOVERSION;  Surgeon: [name], MD;  Location: [facility];  Service: Cardiovascular;  Laterality: N/A;   CARDIOVERSION N/A 12/23/2018   Procedure: CARDIOVERSION;  Surgeon: [name], MD;  Location: [facility];  Service: Cardiovascular;  Laterality: N/A;   CARDIOVERSION N/A 09/30/2019   Procedure: CARDIOVERSION;  Surgeon: Nelva Bush, MD;  Location: [facility];  Service: Cardiovascular;  Laterality: N/A;   CARDIOVERSION N/A 09/30/2021   Procedure: CARDIOVERSION;  Surgeon: Sueanne Margarita, MD;  Location: [facility];  Service: Cardiovascular;  Laterality: N/A;   CESAREAN SECTION     [id number], 1972   TEE WITHOUT CARDIOVERSION N/A 11/28/2018   Procedure: TRANSESOPHAGEAL ECHOCARDIOGRAM (TEE);  Surgeon: [name], MD;  Location: [facility];  Service: Cardiovascular;  Laterality: N/A;   Family Hx:  Family History  Problem Relation Age of Onset   Arthritis Mother        Rheumatoid   Heart disease Mother        Valvular   Hypertension Mother  Heart disease Father 70       CABG   Arthritis Maternal Grandmother        Rheumatoid   Hypertension Maternal Grandmother    Stroke Maternal Grandmother    Breast cancer Paternal Aunt 78   Breast cancer Paternal Aunt 13   Social Hx:   Social History   Tobacco Use   Smoking status: Former    Packs/day: 0.25    Years: 15.00    Total pack years: 3.75    Types: Cigarettes    Quit date: 1992    Years since  quitting: 31.8   Smokeless tobacco: Never   Tobacco comments:    Former smoker 09/22/21  Vaping Use   Vaping Use: Never used  Substance Use Topics   Alcohol use: Not Currently    Comment: 1/2 glass of wine on occassion   Drug use: No   Medication:    Current Outpatient Medications:    acetaminophen (TYLENOL) 650 MG CR tablet, Take 1,300 mg by mouth every 8 (eight) hours as needed for pain., Disp: , Rfl:    alendronate (FOSAMAX) 70 MG tablet, Take 1 tablet (70 mg total) by mouth every 7 (seven) days. Take with a full glass of water on an empty stomach., Disp: 4 tablet, Rfl: 11   ascorbic acid (VITAMIN C) 500 MG tablet, Take 500 mg by mouth daily., Disp: , Rfl:    cholecalciferol (VITAMIN D) 25 MCG (1000 UNIT) tablet, Take 1,000 Units by mouth daily., Disp: , Rfl:    Coenzyme Q10 (COQ10) 200 MG CAPS, Take 1 capsule by mouth daily., Disp: , Rfl:    diltiazem (CARDIZEM CD) 180 MG 24 hr capsule, Take 1 capsule (180 mg total) by mouth 2 (two) times daily., Disp: 180 capsule, Rfl: 2   ELIQUIS 5 MG TABS tablet, TAKE 1 TABLET(5 MG) BY MOUTH TWICE DAILY, Disp: 180 tablet, Rfl: 1   famciclovir (FAMVIR) 500 MG tablet, Take 1 tablet (500 mg total) by mouth 3 (three) times daily., Disp: 21 tablet, Rfl: 0   losartan (COZAAR) 25 MG tablet, TAKE 1/2 TABLET(12.5 MG) BY MOUTH DAILY, Disp: 15 tablet, Rfl: 5   Magnesium 400 MG CAPS, Take 1 capsule by mouth daily at 2 am., Disp: , Rfl:    metoprolol tartrate (LOPRESSOR) 25 MG tablet, Take 1 tablet (25 mg total) by mouth Once PRN for up to 1 dose (Take two hours prior to CT scan, IF your heart rate is over 80.)., Disp: 1 tablet, Rfl: 0   Multiple Vitamin (MULTIVITAMIN WITH MINERALS) TABS tablet, Take 1 tablet by mouth daily., Disp: , Rfl:    rosuvastatin (CRESTOR) 10 MG tablet, Take 1 tablet (10 mg total) by mouth 2 (two) times a week., Disp: 24 tablet, Rfl: 3   vitamin B-12 (CYANOCOBALAMIN) 1000 MCG tablet, Take 1,000 mcg by mouth daily., Disp: , Rfl:     Allergies:  Codeine, Morphine, and Sulfa antibiotics   BP 118/70 (BP Location: Left Arm, Cuff Size: Normal)   Pulse 83   Temp 97.8 F (36.6 C) (Temporal)   Ht 5' (1.524 m)   Wt 147 lb 12.8 oz (67 kg)   SpO2 97%   BMI 28.87 kg/m    Review of Systems: Gen:  Denies  fever, sweats, chills weight loss  HEENT: Denies blurred vision, double vision, ear pain, eye pain, hearing loss, nose bleeds, sore throat Cardiac:  No dizziness, chest pain or heaviness, chest tightness,edema, No JVD Resp:   No cough, -sputum production, -  shortness of breath,-wheezing, -hemoptysis,  Other:  All other systems negative    Physical Examination:   General Appearance: No distress  EYES PERRLA, EOM intact.   NECK Supple, No JVD Pulmonary: normal breath sounds, No wheezing.  CardiovascularNormal S1,S2.  No m/r/g.   Abdomen: Benign, Soft, non-tender. ALL OTHER ROS ARE NEGATIVE     Assessment and Plan:  76 yo white female with Excessive daytime sleepiness Moderate OSA AHI 16  MODERATE OSA Patient has been noncompliant with CPAP due to severe back pain related to compression fracture since December 2022 Prior to to the state patient has been very compliant with her CPAP Patient used her CPAP 3 times in the last 3 weeks Patient would like a different type of mask currently uses full facemask May consider nasal pillows Referral for mask desensitization program in [place]  Atrial fibrillation a flutter history of CHF Importance of sleep apnea on therapy relayed to patient Follow-up cardiology A-fib a flutter ablation therapy was canceled due to back pain Follow-up necessary as needed    MEDICATION ADJUSTMENTS/LABS AND TESTS ORDERED: Continue CPAP as prescribed after mask fitting referral program   CURRENT MEDICATIONS REVIEWED AT [facility]   Patient satisfied with Plan of action and management. All questions answered  Follow up in 1 year  Total time spent 21  mins  Abbie Jablon Patricia Pesa, M.D.  [facility]  Medical Director [place] Director [facility] Cardio[facility] Department

## 2022-04-30 ENCOUNTER — Other Ambulatory Visit: Payer: Self-pay | Admitting: Internal Medicine

## 2022-05-01 ENCOUNTER — Other Ambulatory Visit: Payer: Self-pay | Admitting: Internal Medicine

## 2022-05-16 ENCOUNTER — Ambulatory Visit (HOSPITAL_BASED_OUTPATIENT_CLINIC_OR_DEPARTMENT_OTHER): Payer: Medicare Other | Attending: Internal Medicine | Admitting: Radiology

## 2022-05-16 DIAGNOSIS — G4733 Obstructive sleep apnea (adult) (pediatric): Secondary | ICD-10-CM

## 2022-05-18 DIAGNOSIS — G4733 Obstructive sleep apnea (adult) (pediatric): Secondary | ICD-10-CM | POA: Diagnosis not present

## 2022-06-14 NOTE — Progress Notes (Signed)
Cardiology Office Note    Date:  06/16/2022   ID:  Megean, Fabio 1945/11/02, MRN [id number]  PCP:  Crecencio Mc, MD  Cardiologist:  Nelva Bush, MD  Electrophysiologist:  Vickie Epley, MD   Chief Complaint: Follow-up  History of Present Illness:   Melissa Bonilla is a 76 y.o. female with history of nonobstructive CAD, persistent A-fib and flutter on apixaban, HFrEF, HTN, HLD, osteoporosis with compression fracture, OSA on CPAP, and depression who presents for follow-up of her A-fib/flutter.  She was diagnosed with A-fib in 11/2018.  Echo at that time showed an EF of 35 to 40%, diffuse hypokinesis, normal RV systolic function, mildly to moderately dilated left atrium, mildly dilated right atrium, moderate mitral regurgitation, mild to moderate tricuspid regurgitation, tricuspid aortic valve, and normal size/structure aortic root.  Following diuresis, she underwent TEE guided DCCV in 11/2018 with TEE showing an EF of 40 to 45%.  Following procedure, she was noted to have frequent PVCs and what seemed to be a run of atrial flutter with 2-1 AV block.  Cardioversion did not last as she was noted to be back in A-fib with RVR the following day.  Upon presentation for repeat DCCV in 12/2018, she was noted to have spontaneously converted to sinus rhythm.  Echo in 01/2019, in sinus rhythm, showed an EF of 50 to 78%, diastolic dysfunction, normal RV systolic function and ventricular cavity size, and mild to moderate mitral regurgitation.  Most recent echo from 11/2019 demonstrated an EF of 55 to 60%, no regional wall motion abnormalities, grade 2 diastolic dysfunction, normal RV systolic function and ventricular cavity size, severely dilated left atrium, mild mitral regurgitation, and aortic valve sclerosis without evidence of stenosis.  Coronary CTA in 12/2019 showed a calcium score of 33.6 which was the 50th percentile with 0 to 24% proximal LAD stenosis noted.  She has been followed closely by  EP in the A-fib clinic for her A-fib/flutter and underwent A-fib ablation in 03/2020.  She was found to be in atrial flutter with RVR in 09/2021 at PCPs office and A-fib clinic.  She subsequently underwent DCCV on 09/30/2021.  Following this, due to recurrent palpitations she underwent Zio patch monitoring in 11/2021 which showed a 38% burden of A-fib/flutter.  Given this, she was scheduled for an A-fib ablation on 03/27/2022.  However, this had to be canceled secondary to the patient having suffered a compression fracture.  She comes in today to assess status of back pain and potential rescheduling of A-fib ablation.  She comes in doing well from a cardiac perspective, and is without symptoms of angina or decompensation.  She has not had any further palpitations consistent with prior A-fib/flutter.  No dizziness, presyncope, or syncope.  No further falls.  No bleeding concerns.  She does note she was under significant stress surrounding her back pain when she underwent cardiac monitoring.  Her back discomfort continues to improve, though does still have some issues when laying supine.  With her symptomatic improvement she wonders if she still needs to move forward with ablation.   Labs independently reviewed: 02/2022 - BUN 17, serum creatinine 0.79, potassium 4.5, Hgb 14.8, PLT 228 11/2021 - albumin 3.8, AST/ALT normal 03/2021 - TC 239, TG 136, HDL 51, LDL 160 04/2020 - TSH normal  Past Medical History:  Diagnosis Date   Arrhythmia    atrial fibrillation   CHF (congestive heart failure) (HCC)    Depression    Hyperlipidemia  Hypertension    Menopausal syndrome (hot flashes) 1992   used HRT fo 2 yrs    Obstructive sleep apnea    Persistent atrial fibrillation ([place])    Shingles rash 09/20/2021    Past Surgical History:  Procedure Laterality Date   APPENDECTOMY  1972   ATRIAL FIBRILLATION ABLATION N/A 04/09/2020   Procedure: ATRIAL FIBRILLATION ABLATION;  Surgeon: Vickie Epley, MD;   Location: [facility];  Service: Cardiovascular;  Laterality: N/A;   CARDIOVERSION N/A 11/28/2018   Procedure: CARDIOVERSION;  Surgeon: [name], MD;  Location: [facility];  Service: Cardiovascular;  Laterality: N/A;   CARDIOVERSION N/A 12/23/2018   Procedure: CARDIOVERSION;  Surgeon: [name], MD;  Location: [facility];  Service: Cardiovascular;  Laterality: N/A;   CARDIOVERSION N/A 09/30/2019   Procedure: CARDIOVERSION;  Surgeon: Nelva Bush, MD;  Location: [facility];  Service: Cardiovascular;  Laterality: N/A;   CARDIOVERSION N/A 09/30/2021   Procedure: CARDIOVERSION;  Surgeon: Sueanne Margarita, MD;  Location: [facility];  Service: Cardiovascular;  Laterality: N/A;   CESAREAN SECTION     [id number], 1972   TEE WITHOUT CARDIOVERSION N/A 11/28/2018   Procedure: TRANSESOPHAGEAL ECHOCARDIOGRAM (TEE);  Surgeon: [name], MD;  Location: [facility];  Service: Cardiovascular;  Laterality: N/A;    Current Medications: Current Meds  Medication Sig   acetaminophen (TYLENOL) 650 MG CR tablet Take 1,300 mg by mouth every 8 (eight) hours as needed for pain.   alendronate (FOSAMAX) 70 MG tablet Take 1 tablet (70 mg total) by mouth every 7 (seven) days. Take with a full glass of water on an empty stomach.   ascorbic acid (VITAMIN C) 500 MG tablet Take 500 mg by mouth daily.   cholecalciferol (VITAMIN D) 25 MCG (1000 UNIT) tablet Take 1,000 Units by mouth daily.   Coenzyme Q10 (COQ10) 200 MG CAPS Take 1 capsule by mouth daily.   diltiazem (CARDIZEM CD) 180 MG 24 hr capsule Take 1 capsule (180 mg total) by mouth 2 (two) times daily.   ELIQUIS 5 MG TABS tablet TAKE 1 TABLET(5 MG) BY MOUTH TWICE DAILY   losartan (COZAAR) 25 MG tablet TAKE 1/2 TABLET(12.5 MG) BY MOUTH DAILY   Magnesium 400 MG CAPS Take 1 capsule by mouth daily at 2 am.   Multiple Vitamin (MULTIVITAMIN WITH MINERALS) TABS tablet Take 1 tablet by mouth daily.   rosuvastatin (CRESTOR) 10 MG tablet Take 1 tablet (10  mg total) by mouth 2 (two) times a week.   vitamin B-12 (CYANOCOBALAMIN) 1000 MCG tablet Take 1,000 mcg by mouth daily.    Allergies:   Codeine, Morphine, and Sulfa antibiotics   Social History   Socioeconomic History   Marital status: Married    Spouse name: Not on file   Number of children: Not on file   Years of education: Not on file   Highest education level: Not on file  Occupational History    Employer: self    Comment: Owner of a cleaning Business  Tobacco Use   Smoking status: Former    Packs/day: 0.25    Years: 15.00    Total pack years: 3.75    Types: Cigarettes    Quit date: 1992    Years since quitting: 32.0   Smokeless tobacco: Never   Tobacco comments:    Former smoker 09/22/21  Vaping Use   Vaping Use: Never used  Substance and Sexual Activity   Alcohol use: Not Currently    Comment: 1/2 glass of  wine on occassion   Drug use: No   Sexual activity: Not on file  Other Topics Concern   Not on file  Social History Narrative   Not on file   Social Determinants of Health   Financial Resource Strain: Low Risk  (12/16/2021)   Overall Financial Resource Strain (CARDIA)    Difficulty of Paying Living Expenses: Not hard at all  Food Insecurity: No Food Insecurity (12/16/2021)   Hunger Vital Sign    Worried About Running Out of Food in the Last Year: Never true    Ran Out of Food in the Last Year: Never true  Transportation Needs: No Transportation Needs (12/16/2021)   PRAPARE - Hydrologist (Medical): No    Lack of Transportation (Non-Medical): No  Physical Activity: Not on file  Stress: No Stress Concern Present (12/16/2021)   [place]    Feeling of Stress : Not at all  Social Connections: Unknown (12/16/2021)   Social Connection and Isolation Panel [NHANES]    Frequency of Communication with Friends and Family: Not on file    Frequency of Social Gatherings with  Friends and Family: Not on file    Attends Religious Services: Not on file    Active Member of Clubs or Organizations: Not on file    Attends Archivist Meetings: Not on file    Marital Status: Married     Family History:  The patient's family history includes Arthritis in her maternal grandmother and mother; Breast cancer (age of onset: 17) in her paternal aunt; Breast cancer (age of onset: 75) in her paternal aunt; Heart disease in her mother; Heart disease (age of onset: 7) in her father; Hypertension in her maternal grandmother and mother; Stroke in her maternal grandmother.  ROS:   12-point review of systems is negative unless otherwise noted in the HPI.   EKGs/Labs/Other Studies Reviewed:    Studies reviewed were summarized above. The additional studies were reviewed today:  Zio patch 11/2021: HR 54 - 199 bpm, average 89 bpm. 1 NSVT lasting 9 beats. 38% burden of AF/AFL with rates 67-199 bpm, average 122 bpm. 2 pauses, longest 3.6 seconds. Symptom triggered episode corresponds to AF. Occasional supraventricular ectopy, 1.2%. Rare ventricular ectopy. __________  A-fib ablation 04/09/2020: CONCLUSIONS: 1. Successful PVI 2. Successful ablation/isolation of the posterior wall 3. Intracardiac echo reveals normal left ventricular function, dilated left atrium 4. No early apparent complications. 5. Continue amiodarone for now. Will discuss stopping this at the 52-monthfollow-up appointment based on rhythm over the next 3 months.  __________  Coronary CTA 12/25/2019: Aorta: Normal size. Aortic root and descending aortic wall calcifications noted. No dissection.   Aortic Valve:  Trileaflet.  No calcifications.   Coronary Arteries:  Normal coronary origin.  Right dominance.   RCA is a large dominant artery that gives rise to PDA and PLA. There is no plaque.   Left main is a large artery that gives rise to LAD and LCX arteries.   LAD is a large vessel that has  mild calcified plaque in the proximal segment causing minimal stenosis (0-24%).   LCX is a non-dominant artery that gives rise to two obtuse marginal branches. There is no plaque.   Other findings:   Normal pulmonary vein drainage into the left atrium.   Normal left atrial appendage without a thrombus.   Normal size of the pulmonary artery.   IMPRESSION: 1. Low coronary calcium  score of 33.6. This was 50th percentile for age and sex matched control. 2. Normal coronary origin with right dominance. 3. Mild calcified plaque in the proximal LAD causing minimal stenosis (0-24%) 4. CAD-RADS 1. Minimal non-obstructive CAD (0-24%). Consider non-atherosclerotic causes of chest pain. Consider preventive therapy and risk factor modification. 5.  Etiology for cardiomyopathy is nonischemic. __________  2D echo 11/26/2019: 1. Left ventricular ejection fraction, by estimation, is 55 to 60%. The  left ventricle has normal function. The left ventricle has no regional  wall motion abnormalities. Left ventricular diastolic parameters are  consistent with Grade II diastolic  dysfunction (pseudonormalization).   2. Right ventricular systolic function is normal. The right ventricular  size is normal.   3. Left atrial size was severely dilated.   4. The mitral valve is normal in structure. Mild mitral valve  regurgitation.   5. The aortic valve is tricuspid. Aortic valve regurgitation is not  visualized. Mild aortic valve sclerosis is present, with no evidence of  aortic valve stenosis.  __________  Limited echo 01/30/2019: 1. The left ventricle has low normal systolic function, with an ejection fraction of 50-55%. The cavity size was normal. Left ventricular diastolic Doppler parameters are consistent with impaired relaxation.  2. The right ventricle has normal systolc function. The cavity was normal. There is no increase in right ventricular wall thickness. Unable to estimate RVSP  3. Mitral  valve regurgitation is mild to moderate by color flow Doppler. __________   TEE 11/2018: 1. The left ventricle has mild-moderately reduced systolic function, with an ejection fraction of 40-45%. The cavity size was normal. Left ventricular diastolic function could not be evaluated.  2. The right ventricle has normal systolc function. The cavity was normal. There is no increase in right ventricular wall thickness.  3. Left atrial size was mildly dilated.  4. The mitral valve is grossly normal.  5. The tricuspid valve was grossly normal.  6. No intracardiac thrombi or masses were visualized.  7. The patient developed uncontrolled cough few minutes after the probe was inserted due to suspected laryngeal spasm. Thus, a complete study was not performed but I was able to determine that there was no evidence of thrombus. __________   2D echo 11/2018:  1. The left ventricle has moderately reduced systolic function, with an ejection fraction of 35-40%. The cavity size was normal. Left ventricular diastolic function could not be evaluated secondary to atrial fibrillation. Left ventricular diffuse  hypokinesis.  2. The right ventricle has normal systolic function. The cavity was normal. There is mildly increased right ventricular wall thickness.  3. Left atrial size was mild-moderately dilated.  4. Right atrial size was mildly dilated.  5. The mitral valve was not well visualized. Mitral valve regurgitation is moderate by color flow Doppler.  6. The tricuspid valve is not well visualized. Tricuspid valve regurgitation is mild-moderate.  7. The aortic valve is tricuspid. Mild thickening of the aortic valve.  8. The aortic root is normal in size and structure.  9. The inferior vena cava was normal in size with <50% respiratory variability.   EKG:  EKG is ordered today.  The EKG ordered today demonstrates NSR, 64 bpm, anterior artifact, nonspecific anterior ST-T changes improved from prior  tracing  Recent Labs: 12/07/2021: ALT 16 03/06/2022: BUN 17; Creatinine, Ser 0.79; Hemoglobin 14.8; Platelets 228; Potassium 4.5; Sodium 140  Recent Lipid Panel    Component Value Date/Time   CHOL 239 (H) 04/12/2021 1434   TRIG 136.0 04/12/2021 1434  HDL 51.40 04/12/2021 1434   CHOLHDL 5 04/12/2021 1434   VLDL 27.2 04/12/2021 1434   LDLCALC 160 (H) 04/12/2021 1434   LDLDIRECT 175.5 03/19/2013 0930    PHYSICAL EXAM:    VS:  BP (!) 154/74 (BP Location: Left Arm, Patient Position: Sitting, Cuff Size: Normal)   Pulse 64   Ht 5' (1.524 m)   Wt 148 lb 9.6 oz (67.4 kg)   SpO2 94%   BMI 29.02 kg/m   BMI: Body mass index is 29.02 kg/m.  Physical Exam Vitals reviewed.  Constitutional:      Appearance: She is well-developed.  HENT:     Head: Normocephalic and atraumatic.  Eyes:     General:        Right eye: No discharge.        Left eye: No discharge.  Neck:     Vascular: No JVD.  Cardiovascular:     Rate and Rhythm: Normal rate and regular rhythm.     Heart sounds: Normal heart sounds, S1 normal and S2 normal. Heart sounds not distant. No midsystolic click and no opening snap. No murmur heard.    No friction rub.  Pulmonary:     Effort: Pulmonary effort is normal. No respiratory distress.     Breath sounds: Normal breath sounds. No decreased breath sounds, wheezing or rales.  Chest:     Chest wall: No tenderness.  Abdominal:     General: There is no distension.  Musculoskeletal:     Cervical back: Normal range of motion.  Skin:    General: Skin is warm and dry.     Nails: There is no clubbing.  Neurological:     Mental Status: She is alert and oriented to person, place, and time.  Psychiatric:        Speech: Speech normal.        Behavior: Behavior normal.        Thought Content: Thought content normal.        Judgment: Judgment normal.     Wt Readings from Last 3 Encounters:  06/16/22 148 lb 9.6 oz (67.4 kg)  05/16/22 147 lb (66.7 kg)  04/26/22 147 lb 12.8  oz (67 kg)     ASSESSMENT & PLAN:   Persistent A-fib/flutter: Maintaining sinus rhythm in the office today.  As if she has noted an improvement in her back pain, she has not had any further palpitations consistent with prior A-fib/flutter.  Given this, she wonders if she still needs to pursue ablation.  Recommend pursuing ZIO XT to quantify A-fib burden down as she is in less pain and less stress.  If her burden has significantly improved we may not need to pursue ablation at this time.  Continue diltiazem.  CHA2DS2-VASc 6.  She remains on apixaban 5 mg twice daily and does not meet reduced dosing criteria.  No symptoms concerning for bleeding.  Recent renal function, electrolytes, and hemoglobin stable.  Nonobstructive CAD: No symptoms suggestive of angina.  Continue aggressive risk factor modification and primary prevention including apixaban in place of aspirin given history of persistent A-fib/flutter, rosuvastatin, and losartan.  No indication for further ischemic testing at this time.  HFrEF secondary to NICM: She appears euvolemic and well compensated.  She has had subsequent normalization of LV systolic function.  Not requiring a standing loop diuretic.  She remains on losartan.  HTN: Blood pressure is mildly elevated in the office today though typically well-controlled.  Continue to monitor.  HLD: LDL 160  in 03/2021.  Goal LDL less than 70.  Remains on rosuvastatin which is followed by PCP.   Disposition: F/u with Dr. Saunders Revel or an APP in 6 weeks.   Medication Adjustments/Labs and Tests Ordered: Current medicines are reviewed at length with the patient today.  Concerns regarding medicines are outlined above. Medication changes, Labs and Tests ordered today are summarized above and listed in the Patient Instructions accessible in Encounters.   Signed, Christell Faith, PA-C 06/16/2022 1:57 PM     [address] [place], [place] [address] [phone]

## 2022-06-16 ENCOUNTER — Ambulatory Visit: Payer: Medicare Other | Attending: Physician Assistant | Admitting: Physician Assistant

## 2022-06-16 ENCOUNTER — Ambulatory Visit (INDEPENDENT_AMBULATORY_CARE_PROVIDER_SITE_OTHER): Payer: Medicare Other

## 2022-06-16 ENCOUNTER — Encounter: Payer: Self-pay | Admitting: Physician Assistant

## 2022-06-16 VITALS — BP 154/74 | HR 64 | Ht 60.0 in | Wt 148.6 lb

## 2022-06-16 DIAGNOSIS — I428 Other cardiomyopathies: Secondary | ICD-10-CM

## 2022-06-16 DIAGNOSIS — I2510 Atherosclerotic heart disease of native coronary artery without angina pectoris: Secondary | ICD-10-CM | POA: Diagnosis not present

## 2022-06-16 DIAGNOSIS — I4892 Unspecified atrial flutter: Secondary | ICD-10-CM

## 2022-06-16 DIAGNOSIS — I10 Essential (primary) hypertension: Secondary | ICD-10-CM

## 2022-06-16 DIAGNOSIS — E7800 Pure hypercholesterolemia, unspecified: Secondary | ICD-10-CM

## 2022-06-16 DIAGNOSIS — I4819 Other persistent atrial fibrillation: Secondary | ICD-10-CM

## 2022-06-16 NOTE — Patient Instructions (Addendum)
Medication Instructions:  No changes at this time.   *If you need a refill on your cardiac medications before your next appointment, please call your pharmacy*   Lab Work: None  If you have labs (blood work) drawn today and your tests are completely normal, you will receive your results only by: [place] (if you have MyChart) OR A paper copy in the mail If you have any lab test that is abnormal or we need to change your treatment, we will call you to review the results.   Testing/Procedures: Your physician has recommended that you wear a Zio monitor for 2 weeks.   This monitor is a medical device that records the heart's electrical activity. Doctors most often use these monitors to diagnose arrhythmias. Arrhythmias are problems with the speed or rhythm of the heartbeat. The monitor is a small device applied to your chest. You can wear one while you do your normal daily activities. While wearing this monitor if you have any symptoms to push the button and record what you felt. Once you have worn this monitor for the period of time provider prescribed (Usually 14 days), you will return the monitor device in the postage paid box. Once it is returned they will download the data collected and provide [place] with a report which the provider will then review and we will call you with those results. Important tips:  Avoid showering during the first 24 hours of wearing the monitor. Avoid excessive sweating to help maximize wear time. Do not submerge the device, no hot tubs, and no swimming pools. Keep any lotions or oils away from the patch. After 24 hours you may shower with the patch on. Take brief showers with your back facing the shower head.  Do not remove patch once it has been placed because that will interrupt data and decrease adhesive wear time. Push the button when you have any symptoms and write down what you were feeling. Once you have completed wearing your monitor, remove and  place into box which has postage paid and place in your outgoing mailbox.  If for some reason you have misplaced your box then call our office and we can provide another box and/or mail it off for you.      Follow-Up: At [facility], you and your health needs are our priority.  As part of our continuing mission to provide you with exceptional heart care, we have created designated Provider Care Teams.  These Care Teams include your primary Cardiologist (physician) and Advanced Practice Providers (APPs -  Physician Assistants and Nurse Practitioners) who all work together to provide you with the care you need, when you need it.   Your next appointment:   6 week(s)  The format for your next appointment:   In Person  Provider:   Nelva Bush, MD or Christell Faith, PA-C      Important Information About [place]

## 2022-06-17 DIAGNOSIS — G4733 Obstructive sleep apnea (adult) (pediatric): Secondary | ICD-10-CM | POA: Diagnosis not present

## 2022-06-23 DIAGNOSIS — I4819 Other persistent atrial fibrillation: Secondary | ICD-10-CM

## 2022-06-26 ENCOUNTER — Other Ambulatory Visit: Payer: Self-pay | Admitting: Internal Medicine

## 2022-06-28 ENCOUNTER — Ambulatory Visit: Payer: Medicare Other | Admitting: Cardiology

## 2022-06-29 ENCOUNTER — Other Ambulatory Visit: Payer: Self-pay | Admitting: Internal Medicine

## 2022-06-29 DIAGNOSIS — I4819 Other persistent atrial fibrillation: Secondary | ICD-10-CM

## 2022-06-29 NOTE — Telephone Encounter (Signed)
Please review

## 2022-07-14 DIAGNOSIS — I4819 Other persistent atrial fibrillation: Secondary | ICD-10-CM | POA: Diagnosis not present

## 2022-07-18 ENCOUNTER — Telehealth: Payer: Self-pay | Admitting: *Deleted

## 2022-07-18 NOTE — Telephone Encounter (Signed)
-----  Message from Rise Mu, PA-C sent at 07/17/2022  4:40 PM EST ----- Heart monitor showed a predominant rhythm of sinus with an average rate of 66 bpm (range 48 to 106 bpm in sinus), single episode of NSVT (fast rhythm coming from the bottom portion of the heart) occurred lasting 8 beats with a maximum rate of 190 bpm, 169 supraventricular runs occurred lasting up to 11 beats, PAF/flutter was observed with a less than 1% burden with the longest episode lasting 2 hours and 23 minutes.  Patient triggered events corresponded to sinus rhythm and A-fib/flutter with RVR.  Recommendations: -When compared to monitor from 12/2021, her A-fib/flutter burden has improved significantly from 38% to less than 1% -She is still having paroxysms of A-fib, though these are much less frequent -With regards to her frequent episodes of nonsustained SVT, she should remain on current dose of diltiazem and we can discuss if we would like to titrate any further in follow-up -Patient already on apixaban for known persistent A-fib

## 2022-07-18 NOTE — Telephone Encounter (Signed)
Left voicemail message to call back for review of results.  

## 2022-07-19 NOTE — Telephone Encounter (Signed)
  Pt is returning call

## 2022-07-20 NOTE — Telephone Encounter (Signed)
Reviewed results and recommendations with patient and confirmed upcoming appointment. She verbalized understanding with no further questions at this time.

## 2022-07-25 ENCOUNTER — Other Ambulatory Visit: Payer: Self-pay | Admitting: Internal Medicine

## 2022-07-26 NOTE — Progress Notes (Signed)
Cardiology Office Note    Date:  07/28/2022   ID:  Mckenly, Bencomo Jul 03, 1945, MRN [id number]  PCP:  Crecencio Mc, MD  Cardiologist:  Nelva Bush, MD  Electrophysiologist:  Vickie Epley, MD   Chief Complaint: Follow-up  History of Present Illness:   Melissa Bonilla is a 77 y.o. female with history of nonobstructive CAD, persistent A-fib and flutter on apixaban, HFrEF, HTN, HLD, osteoporosis with compression fracture, OSA on CPAP, and depression who presents for follow-up of Zio patch.   She was diagnosed with A-fib in 11/2018.  Echo at that time showed an EF of 35 to 40%, diffuse hypokinesis, normal RV systolic function, mildly to moderately dilated left atrium, mildly dilated right atrium, moderate mitral regurgitation, mild to moderate tricuspid regurgitation, tricuspid aortic valve, and normal size/structure aortic root.  Following diuresis, she underwent TEE guided DCCV in 11/2018 with TEE showing an EF of 40 to 45%.  Following procedure, she was noted to have frequent PVCs and what seemed to be a run of atrial flutter with 2-1 AV block.  Cardioversion did not last as she was noted to be back in A-fib with RVR the following day.  Upon presentation for repeat DCCV in 12/2018, she was noted to have spontaneously converted to sinus rhythm.  Echo in 01/2019, in sinus rhythm, showed an EF of 50 to [id number], diastolic dysfunction, normal RV systolic function and ventricular cavity size, and mild to moderate mitral regurgitation.  Most recent echo from 11/2019 demonstrated an EF of 55 to 60%, no regional wall motion abnormalities, grade 2 diastolic dysfunction, normal RV systolic function and ventricular cavity size, severely dilated left atrium, mild mitral regurgitation, and aortic valve sclerosis without evidence of stenosis.  Coronary CTA in 12/2019 showed a calcium score of 33.6 which was the 50th percentile with 0 to 24% proximal LAD stenosis noted.  She has been followed closely by EP in  the A-fib clinic for her A-fib/flutter and underwent A-fib ablation in 03/2020.   She was found to be in atrial flutter with RVR in 09/2021 at PCPs office and A-fib clinic.  She subsequently underwent DCCV on 09/30/2021.  Following this, due to recurrent palpitations she underwent Zio patch monitoring in 11/2021 which showed a 38% burden of A-fib/flutter.  Given this, she was scheduled for an A-fib ablation on 03/27/2022.  However, this had to be canceled secondary to the patient having suffered a compression fracture.  She was last seen in the office in 05/2022 and was without symptoms of angina or decompensation.  She had not had any further palpitations consistent with prior A-fib/flutter.  She did note she was under significant stress surrounding her back pain when she underwent prior outpatient cardiac monitoring.  Given improvement in symptoms, she wondered if her A-fib burden was as significant.  In this setting, she underwent repeat outpatient cardiac monitoring with Zio patch in 05/2022 showing a predominant rhythm of sinus with an average rate of 66 bpm (range 48 to 106 bpm in sinus), single episode of NSVT occurred lasting 8 beats, 169 episodes of SVT lasting up to 11 beats, PAF/flutter was noted with an overall burden of less than 1% with the longest episode lasting 2 hours and 23 minutes.  Rare PACs and PVCs.  Patient triggered events corresponded to sinus rhythm and A-fib/flutter with RVR.  She comes in doing very well from a cardiac perspective and is without symptoms of angina or decompensation.  No dyspnea, palpitations, dizziness, presyncope,  or syncope.  No falls or symptoms concerning for bleeding.  She remains adherent and is tolerating apixaban, diltiazem, losartan, and rosuvastatin.  No lower extremity swelling or progressive orthopnea.  Overall, she is doing well and does not have any acute cardiac concerns at this time.   Labs independently reviewed: 02/2022 - BUN 17, serum creatinine  0.79, potassium 4.5, Hgb 14.8, PLT 228 11/2021 - albumin 3.8, AST/ALT normal 03/2021 - TC 239, TG 136, HDL 51, LDL 160 04/2020 - TSH normal  Past Medical History:  Diagnosis Date   Arrhythmia    atrial fibrillation   CHF (congestive heart failure) ([place])    Depression    Hyperlipidemia    Hypertension    Menopausal syndrome (hot flashes) 1992   used HRT fo 2 yrs    Obstructive sleep apnea    Persistent atrial fibrillation ([place])    Shingles rash 09/20/2021    Past Surgical History:  Procedure Laterality Date   APPENDECTOMY  1972   ATRIAL FIBRILLATION ABLATION N/A 04/09/2020   Procedure: ATRIAL FIBRILLATION ABLATION;  Surgeon: Vickie Epley, MD;  Location: [facility];  Service: Cardiovascular;  Laterality: N/A;   CARDIOVERSION N/A 11/28/2018   Procedure: CARDIOVERSION;  Surgeon: [name], MD;  Location: [facility];  Service: Cardiovascular;  Laterality: N/A;   CARDIOVERSION N/A 12/23/2018   Procedure: CARDIOVERSION;  Surgeon: [name], MD;  Location: [facility];  Service: Cardiovascular;  Laterality: N/A;   CARDIOVERSION N/A 09/30/2019   Procedure: CARDIOVERSION;  Surgeon: Nelva Bush, MD;  Location: [facility];  Service: Cardiovascular;  Laterality: N/A;   CARDIOVERSION N/A 09/30/2021   Procedure: CARDIOVERSION;  Surgeon: Sueanne Margarita, MD;  Location: [facility];  Service: Cardiovascular;  Laterality: N/A;   CESAREAN SECTION     [id number], 1972   TEE WITHOUT CARDIOVERSION N/A 11/28/2018   Procedure: TRANSESOPHAGEAL ECHOCARDIOGRAM (TEE);  Surgeon: [name], MD;  Location: [facility];  Service: Cardiovascular;  Laterality: N/A;    Current Medications: Current Meds  Medication Sig   acetaminophen (TYLENOL) 650 MG CR tablet Take 1,300 mg by mouth every 8 (eight) hours as needed for pain.   alendronate (FOSAMAX) 70 MG tablet Take 1 tablet (70 mg total) by mouth every 7 (seven) days. Take with a full glass of water on an empty stomach.   ascorbic  acid (VITAMIN C) 500 MG tablet Take 500 mg by mouth daily.   cholecalciferol (VITAMIN D) 25 MCG (1000 UNIT) tablet Take 1,000 Units by mouth daily.   Coenzyme Q10 (COQ10) 200 MG CAPS Take 1 capsule by mouth daily.   diltiazem (CARDIZEM CD) 180 MG 24 hr capsule Take 1 capsule (180 mg total) by mouth 2 (two) times daily.   ELIQUIS 5 MG TABS tablet TAKE 1 TABLET(5 MG) BY MOUTH TWICE DAILY   losartan (COZAAR) 25 MG tablet TAKE 1/2 TABLET(12.5 MG) BY MOUTH DAILY   Magnesium 400 MG CAPS Take 1 capsule by mouth daily at 2 am.   Multiple Vitamin (MULTIVITAMIN WITH MINERALS) TABS tablet Take 1 tablet by mouth daily.   rosuvastatin (CRESTOR) 10 MG tablet Take 1 tablet (10 mg total) by mouth 2 (two) times a week.   vitamin B-12 (CYANOCOBALAMIN) 1000 MCG tablet Take 1,000 mcg by mouth daily.    Allergies:   Codeine, Morphine, and Sulfa antibiotics   Social History   Socioeconomic History   Marital status: Married    Spouse name: Not on file   Number of children: Not on file  Years of education: Not on file   Highest education level: Not on file  Occupational History    Employer: self    Comment: Owner of a cleaning Business  Tobacco Use   Smoking status: Former    Packs/day: 0.25    Years: 15.00    Total pack years: 3.75    Types: Cigarettes    Quit date: 77    Years since quitting: 32.1   Smokeless tobacco: Never   Tobacco comments:    Former smoker 09/22/21  Vaping Use   Vaping Use: Never used  Substance and Sexual Activity   Alcohol use: Not Currently    Comment: 1/2 glass of wine on occassion   Drug use: No   Sexual activity: Not on file  Other Topics Concern   Not on file  Social History Narrative   Not on file   Social Determinants of Health   Financial Resource Strain: Low Risk  (12/16/2021)   Overall Financial Resource Strain (CARDIA)    Difficulty of Paying Living Expenses: Not hard at all  Food Insecurity: No Food Insecurity (12/16/2021)   Hunger Vital Sign     Worried About Running Out of Food in the Last Year: Never true    Eleva in the Last Year: Never true  Transportation Needs: No Transportation Needs (12/16/2021)   PRAPARE - Hydrologist (Medical): No    Lack of Transportation (Non-Medical): No  Physical Activity: Not on file  Stress: No Stress Concern Present (12/16/2021)   [place]    Feeling of Stress : Not at all  Social Connections: Unknown (12/16/2021)   Social Connection and Isolation Panel [NHANES]    Frequency of Communication with Friends and Family: Not on file    Frequency of Social Gatherings with Friends and Family: Not on file    Attends Religious Services: Not on file    Active Member of Clubs or Organizations: Not on file    Attends Archivist Meetings: Not on file    Marital Status: Married     Family History:  The patient's family history includes Arthritis in her maternal grandmother and mother; Breast cancer (age of onset: 39) in her paternal aunt; Breast cancer (age of onset: 51) in her paternal aunt; Heart disease in her mother; Heart disease (age of onset: 4) in her father; Hypertension in her maternal grandmother and mother; Stroke in her maternal grandmother.  ROS:   12-point review of systems is negative unless otherwise noted in the HPI.   EKGs/Labs/Other Studies Reviewed:    Studies reviewed were summarized above. The additional studies were reviewed today:  Zio patch 05/2022:   The patient was monitored for 14 days.   The predominant rhythm was sinus with an average rate of 66 bpm (48-106 bpm in sinus).   There were rare PAC's and PVC's.   A single episode of nonsustained ventricular tachycardia occurred, lasting 8 beats with a maximum rate of 190 bpm.   There were 169 supraventricular runs, lasting up to 11 beats with a maximum rate of 176 bpm.   Paroxysmal atrial fibrillation/flutter was  observed (burden <1%).  The average ventricular rate was 124 bpm (range 84-164 bpm).  The longest episode lasted 2 hours, 23 minutes.   Patient triggered events correspond to sinus rhythm and atrial fibrillation/flutter with rapid ventricular response.   Predominantly sinus rhythm with paroxysmal atrial fibrillation/flutter, as detailed above (  atrial fibrillation/flutter burden <1%).  Multiple episodes of PSVT were observed, as well as rare PAC's and PVC's. __________  Elwyn Reach patch 11/2021: HR 54 - 199 bpm, average 89 bpm. 1 NSVT lasting 9 beats. 38% burden of AF/AFL with rates 67-199 bpm, average 122 bpm. 2 pauses, longest 3.6 seconds. Symptom triggered episode corresponds to AF. Occasional supraventricular ectopy, 1.2%. Rare ventricular ectopy. __________   A-fib ablation 04/09/2020: CONCLUSIONS: 1. Successful PVI 2. Successful ablation/isolation of the posterior wall 3. Intracardiac echo reveals normal left ventricular function, dilated left atrium 4. No early apparent complications. 5. Continue amiodarone for now. Will discuss stopping this at the 66-monthfollow-up appointment based on rhythm over the next 3 months.  __________   Coronary CTA 12/25/2019: Aorta: Normal size. Aortic root and descending aortic wall calcifications noted. No dissection.   Aortic Valve:  Trileaflet.  No calcifications.   Coronary Arteries:  Normal coronary origin.  Right dominance.   RCA is a large dominant artery that gives rise to PDA and PLA. There is no plaque.   Left main is a large artery that gives rise to LAD and LCX arteries.   LAD is a large vessel that has mild calcified plaque in the proximal segment causing minimal stenosis (0-24%).   LCX is a non-dominant artery that gives rise to two obtuse marginal branches. There is no plaque.   Other findings:   Normal pulmonary vein drainage into the left atrium.   Normal left atrial appendage without a thrombus.   Normal size of the  pulmonary artery.   IMPRESSION: 1. Low coronary calcium score of 33.6. This was 50th percentile for age and sex matched control. 2. Normal coronary origin with right dominance. 3. Mild calcified plaque in the proximal LAD causing minimal stenosis (0-24%) 4. CAD-RADS 1. Minimal non-obstructive CAD (0-24%). Consider non-atherosclerotic causes of chest pain. Consider preventive therapy and risk factor modification. 5.  Etiology for cardiomyopathy is nonischemic. __________   2D echo 11/26/2019: 1. Left ventricular ejection fraction, by estimation, is 55 to 60%. The  left ventricle has normal function. The left ventricle has no regional  wall motion abnormalities. Left ventricular diastolic parameters are  consistent with Grade II diastolic  dysfunction (pseudonormalization).   2. Right ventricular systolic function is normal. The right ventricular  size is normal.   3. Left atrial size was severely dilated.   4. The mitral valve is normal in structure. Mild mitral valve  regurgitation.   5. The aortic valve is tricuspid. Aortic valve regurgitation is not  visualized. Mild aortic valve sclerosis is present, with no evidence of  aortic valve stenosis.  __________   Limited echo 01/30/2019: 1. The left ventricle has low normal systolic function, with an ejection fraction of 50-55%. The cavity size was normal. Left ventricular diastolic Doppler parameters are consistent with impaired relaxation.  2. The right ventricle has normal systolc function. The cavity was normal. There is no increase in right ventricular wall thickness. Unable to estimate RVSP  3. Mitral valve regurgitation is mild to moderate by color flow Doppler. __________   TEE 11/2018: 1. The left ventricle has mild-moderately reduced systolic function, with an ejection fraction of 40-45%. The cavity size was normal. Left ventricular diastolic function could not be evaluated.  2. The right ventricle has normal systolc  function. The cavity was normal. There is no increase in right ventricular wall thickness.  3. Left atrial size was mildly dilated.  4. The mitral valve is grossly normal.  5. The  tricuspid valve was grossly normal.  6. No intracardiac thrombi or masses were visualized.  7. The patient developed uncontrolled cough few minutes after the probe was inserted due to suspected laryngeal spasm. Thus, a complete study was not performed but I was able to determine that there was no evidence of thrombus. __________   2D echo 11/2018:  1. The left ventricle has moderately reduced systolic function, with an ejection fraction of 35-40%. The cavity size was normal. Left ventricular diastolic function could not be evaluated secondary to atrial fibrillation. Left ventricular diffuse  hypokinesis.  2. The right ventricle has normal systolic function. The cavity was normal. There is mildly increased right ventricular wall thickness.  3. Left atrial size was mild-moderately dilated.  4. Right atrial size was mildly dilated.  5. The mitral valve was not well visualized. Mitral valve regurgitation is moderate by color flow Doppler.  6. The tricuspid valve is not well visualized. Tricuspid valve regurgitation is mild-moderate.  7. The aortic valve is tricuspid. Mild thickening of the aortic valve.  8. The aortic root is normal in size and structure.  9. The inferior vena cava was normal in size with <50% respiratory variability.   EKG:  EKG is ordered today.  The EKG ordered today demonstrates NSR, 74 bpm, nonspecific ST-T changes, consistent with prior tracings  Recent Labs: 12/07/2021: ALT 16 03/06/2022: BUN 17; Creatinine, Ser 0.79; Hemoglobin 14.8; Platelets 228; Potassium 4.5; Sodium 140  Recent Lipid Panel    Component Value Date/Time   CHOL 239 (H) 04/12/2021 1434   TRIG 136.0 04/12/2021 1434   HDL 51.40 04/12/2021 1434   CHOLHDL 5 04/12/2021 1434   VLDL 27.2 04/12/2021 1434   LDLCALC 160 (H)  04/12/2021 1434   LDLDIRECT 175.5 03/19/2013 0930    PHYSICAL EXAM:    VS:  BP (!) 140/72 (BP Location: Left Arm, Patient Position: Sitting, Cuff Size: Normal)   Pulse 74   Ht 5' (1.524 m)   Wt 150 lb (68 kg)   SpO2 97%   BMI 29.29 kg/m   BMI: Body mass index is 29.29 kg/m.  Physical Exam Constitutional:      Appearance: She is well-developed.  HENT:     Head: Normocephalic and atraumatic.  Eyes:     General:        Right eye: No discharge.        Left eye: No discharge.  Neck:     Vascular: No JVD.  Cardiovascular:     Rate and Rhythm: Normal rate and regular rhythm.     Heart sounds: Normal heart sounds, S1 normal and S2 normal. Heart sounds not distant. No midsystolic click and no opening snap. No murmur heard.    No friction rub.  Pulmonary:     Effort: Pulmonary effort is normal. No respiratory distress.     Breath sounds: Normal breath sounds. No decreased breath sounds, wheezing or rales.  Chest:     Chest wall: No tenderness.  Abdominal:     General: There is no distension.  Musculoskeletal:     Cervical back: Normal range of motion.     Right lower leg: No edema.     Left lower leg: No edema.  Skin:    General: Skin is warm and dry.     Nails: There is no clubbing.  Neurological:     Mental Status: She is alert and oriented to person, place, and time.  Psychiatric:        Speech: Speech  normal.        Behavior: Behavior normal.        Thought Content: Thought content normal.        Judgment: Judgment normal.     Wt Readings from Last 3 Encounters:  07/28/22 150 lb (68 kg)  06/16/22 148 lb 9.6 oz (67.4 kg)  05/16/22 147 lb (66.7 kg)     ASSESSMENT & PLAN:   Persistent A-fib/flutter: Maintaining sinus rhythm.  She continues to note no further palpitations following improvement in her pain.  In this setting, Zio patch was repeated which showed a less than 1% A-fib burden.  Given this improvement, there is no current indication for A-fib ablation.   We will continue current medical therapy including diltiazem along with apixaban and given a CHA2DS2-VASc of at least 6.  She does not meet reduced dosing criteria.  No symptoms concerning for bleeding.  Recent renal function, electrolytes, and hemoglobin stable.  I will route a note to her electrophysiologist as an [place].  Nonobstructive CAD: No symptoms suggestive of angina.  Continue aggressive risk factor modification and primary prevention, including apixaban in place of aspirin given history of persistent A-fib/flutter, rosuvastatin, and losartan.  No indication for ischemic testing at this time.  HFrEF secondary to NICM: She is euvolemic and well compensated.  She has had subsequent normalization of LV systolic function.  Not requiring a standing loop diuretic.  She remains on losartan.  HTN: Blood pressure is mildly elevated in the office today, though typically well-controlled.  Continue to monitor.  HLD: LDL followed by PCP.  She remains on rosuvastatin.   Disposition: F/u with Dr. Saunders Revel or an APP in 6 months.   Medication Adjustments/Labs and Tests Ordered: Current medicines are reviewed at length with the patient today.  Concerns regarding medicines are outlined above. Medication changes, Labs and Tests ordered today are summarized above and listed in the Patient Instructions accessible in Encounters.   Signed, Christell Faith, PA-C 07/28/2022 4:39 PM     [facility] [address] [place] [address] [place], [place] [address] [phone]

## 2022-07-27 ENCOUNTER — Encounter (HOSPITAL_COMMUNITY): Payer: Self-pay | Admitting: *Deleted

## 2022-07-28 ENCOUNTER — Ambulatory Visit: Payer: Medicare Other | Admitting: Physician Assistant

## 2022-07-28 ENCOUNTER — Encounter: Payer: Self-pay | Admitting: Physician Assistant

## 2022-07-28 ENCOUNTER — Ambulatory Visit: Payer: Medicare Other | Attending: Physician Assistant | Admitting: Physician Assistant

## 2022-07-28 VITALS — BP 140/72 | HR 74 | Ht 60.0 in | Wt 150.0 lb

## 2022-07-28 DIAGNOSIS — I4819 Other persistent atrial fibrillation: Secondary | ICD-10-CM

## 2022-07-28 DIAGNOSIS — I428 Other cardiomyopathies: Secondary | ICD-10-CM

## 2022-07-28 DIAGNOSIS — E785 Hyperlipidemia, unspecified: Secondary | ICD-10-CM

## 2022-07-28 DIAGNOSIS — I10 Essential (primary) hypertension: Secondary | ICD-10-CM

## 2022-07-28 DIAGNOSIS — I4892 Unspecified atrial flutter: Secondary | ICD-10-CM

## 2022-07-28 DIAGNOSIS — I2510 Atherosclerotic heart disease of native coronary artery without angina pectoris: Secondary | ICD-10-CM

## 2022-07-28 NOTE — Patient Instructions (Signed)
Medication Instructions:  No changes at this time.   *If you need a refill on your cardiac medications before your next appointment, please call your pharmacy*   Lab Work: None  If you have labs (blood work) drawn today and your tests are completely normal, you will receive your results only by: [place] (if you have MyChart) OR A paper copy in the mail If you have any lab test that is abnormal or we need to change your treatment, we will call you to review the results.   Testing/Procedures: None   Follow-Up: At [facility], you and your health needs are our priority.  As part of our continuing mission to provide you with exceptional heart care, we have created designated Provider Care Teams.  These Care Teams include your primary Cardiologist (physician) and Advanced Practice Providers (APPs -  Physician Assistants and Nurse Practitioners) who all work together to provide you with the care you need, when you ne   Your next appointment:   6 month(s)  Provider:   Nelva Bush, MD or Christell Faith, PA-C

## 2022-09-11 ENCOUNTER — Encounter: Payer: Self-pay | Admitting: Internal Medicine

## 2022-09-11 ENCOUNTER — Ambulatory Visit (INDEPENDENT_AMBULATORY_CARE_PROVIDER_SITE_OTHER): Payer: Medicare Other

## 2022-09-11 ENCOUNTER — Ambulatory Visit (INDEPENDENT_AMBULATORY_CARE_PROVIDER_SITE_OTHER): Payer: Medicare Other | Admitting: Internal Medicine

## 2022-09-11 VITALS — BP 122/74 | HR 85 | Temp 98.0°F | Resp 16 | Ht 60.0 in | Wt 146.6 lb

## 2022-09-11 DIAGNOSIS — D6869 Other thrombophilia: Secondary | ICD-10-CM | POA: Diagnosis not present

## 2022-09-11 DIAGNOSIS — R0781 Pleurodynia: Secondary | ICD-10-CM | POA: Diagnosis not present

## 2022-09-11 DIAGNOSIS — M8000XD Age-related osteoporosis with current pathological fracture, unspecified site, subsequent encounter for fracture with routine healing: Secondary | ICD-10-CM | POA: Diagnosis not present

## 2022-09-11 DIAGNOSIS — I428 Other cardiomyopathies: Secondary | ICD-10-CM

## 2022-09-11 DIAGNOSIS — I700 Atherosclerosis of aorta: Secondary | ICD-10-CM

## 2022-09-11 DIAGNOSIS — E875 Hyperkalemia: Secondary | ICD-10-CM

## 2022-09-11 DIAGNOSIS — E785 Hyperlipidemia, unspecified: Secondary | ICD-10-CM

## 2022-09-11 DIAGNOSIS — Z1211 Encounter for screening for malignant neoplasm of colon: Secondary | ICD-10-CM

## 2022-09-11 DIAGNOSIS — N3941 Urge incontinence: Secondary | ICD-10-CM

## 2022-09-11 LAB — COMPREHENSIVE METABOLIC PANEL
ALT: 13 U/L (ref 0–35)
AST: 17 U/L (ref 0–37)
Albumin: 4.3 g/dL (ref 3.5–5.2)
Alkaline Phosphatase: 88 U/L (ref 39–117)
BUN: 22 mg/dL (ref 6–23)
CO2: 27 mEq/L (ref 19–32)
Calcium: 9.5 mg/dL (ref 8.4–10.5)
Chloride: 102 mEq/L (ref 96–112)
Creatinine, Ser: 0.85 mg/dL (ref 0.40–1.20)
GFR: 66.51 mL/min (ref >60.00)
Glucose, Bld: 91 mg/dL (ref 70–99)
Potassium: 5.2 mEq/L — ABNORMAL HIGH (ref 3.5–5.1)
Sodium: 137 mEq/L (ref 135–145)
Total Bilirubin: 0.5 mg/dL (ref 0.2–1.2)
Total Protein: 6.6 g/dL (ref 6.0–8.3)

## 2022-09-11 LAB — URINALYSIS, ROUTINE W REFLEX MICROSCOPIC
Bilirubin Urine: NEGATIVE
Hgb urine dipstick: NEGATIVE
Ketones, ur: NEGATIVE
Leukocytes,Ua: NEGATIVE
Nitrite: NEGATIVE
RBC / HPF: NONE SEEN (ref 0–?)
Specific Gravity, Urine: 1.025 (ref 1.000–1.030)
Total Protein, Urine: NEGATIVE
Urine Glucose: NEGATIVE
Urobilinogen, UA: 0.2 (ref 0.0–1.0)
pH: 6 (ref 5.0–8.0)

## 2022-09-11 LAB — CBC WITH DIFFERENTIAL/PLATELET
Basophils Absolute: 0.1 10*3/uL (ref 0.0–0.1)
Basophils Relative: 0.8 % (ref 0.0–3.0)
Eosinophils Absolute: 0.2 10*3/uL (ref 0.0–0.7)
Eosinophils Relative: 2.5 % (ref 0.0–5.0)
HCT: 43 % (ref 36.0–46.0)
Hemoglobin: 14.8 g/dL (ref 12.0–15.0)
Lymphocytes Relative: 36.3 % (ref 12.0–46.0)
Lymphs Abs: 2.4 10*3/uL (ref 0.7–4.0)
MCHC: 34.4 g/dL (ref 30.0–36.0)
MCV: 90.8 fl (ref 78.0–100.0)
Monocytes Absolute: 0.8 10*3/uL (ref 0.1–1.0)
Monocytes Relative: 11.7 % (ref 3.0–12.0)
Neutro Abs: 3.2 10*3/uL (ref 1.4–7.7)
Neutrophils Relative %: 48.7 % (ref 43.0–77.0)
Platelets: 254 10*3/uL (ref 150.0–400.0)
RBC: 4.73 Mil/uL (ref 3.87–5.11)
RDW: 12.6 % (ref 11.5–15.5)
WBC: 6.6 10*3/uL (ref 4.0–10.5)

## 2022-09-11 MED ORDER — TROSPIUM CHLORIDE 20 MG PO TABS: 20.0000 mg | ORAL_TABLET | Freq: Two times a day (BID) | ORAL | 2 refills | Status: DC

## 2022-09-11 NOTE — Patient Instructions (Addendum)
Start taking 1000 mg of tylenol BEFORE BEDTIME   START TAKING ALGAE CAL AS YOUR SUPPLEMENT   DON'T TAKE ALL  OF YOUR CALCIUM AT ONCE;  YOU CAN ONLY ABSORB 600 MG  PER SERVING  DON'T START SANCTURA UNTIL BLADDER  TESTS RULE OUT INFECTION

## 2022-09-11 NOTE — Assessment & Plan Note (Signed)
SECONDARY TO CHRONIC ATRIAL FIBRILLATION. MANAGED WITH ELIQUIS  

## 2022-09-11 NOTE — Progress Notes (Unsigned)
Subjective:  Patient ID: Melissa Bonilla, female    DOB: 1946-01-12  Age: 77 y.o. MRN: [id number]  CC: There were no encounter diagnoses.   HPI Melissa Bonilla presents for  Chief Complaint  Patient presents with   Medical Management of Chronic Issues   1) NEW ONSET URGE INCONTINENCE  .  Started a year ago around the time of her compression fracture.    2) OsteoporosisL  First fracture L3 healing by Feb 2023 MRI.    3) OSA: not using CPAP regularly because she is tossing and turning at night due ot rib pain .    Outpatient Medications Prior to Visit  Medication Sig Dispense Refill   acetaminophen (TYLENOL) 650 MG CR tablet Take 1,300 mg by mouth every 8 (eight) hours as needed for pain.     alendronate (FOSAMAX) 70 MG tablet Take 1 tablet (70 mg total) by mouth every 7 (seven) days. Take with a full glass of water on an empty stomach. 4 tablet 11   ascorbic acid (VITAMIN C) 500 MG tablet Take 500 mg by mouth daily.     cholecalciferol (VITAMIN D) 25 MCG (1000 UNIT) tablet Take 1,000 Units by mouth daily.     Coenzyme Q10 (COQ10) 200 MG CAPS Take 1 capsule by mouth daily.     diltiazem (CARDIZEM CD) 180 MG 24 hr capsule Take 1 capsule (180 mg total) by mouth 2 (two) times daily. 180 capsule 2   ELIQUIS 5 MG TABS tablet TAKE 1 TABLET(5 MG) BY MOUTH TWICE DAILY 180 tablet 1   losartan (COZAAR) 25 MG tablet TAKE 1/2 TABLET(12.5 MG) BY MOUTH DAILY 45 tablet 0   Magnesium 400 MG CAPS Take 1 capsule by mouth daily at 2 am.     Multiple Vitamin (MULTIVITAMIN WITH MINERALS) TABS tablet Take 1 tablet by mouth daily.     rosuvastatin (CRESTOR) 10 MG tablet Take 1 tablet (10 mg total) by mouth 2 (two) times a week. 24 tablet 3   vitamin B-12 (CYANOCOBALAMIN) 1000 MCG tablet Take 1,000 mcg by mouth daily.     No facility-administered medications prior to visit.    Review of Systems;  Patient denies headache, fevers, malaise, unintentional weight loss, skin rash, eye pain, sinus congestion  and sinus pain, sore throat, dysphagia,  hemoptysis , cough, dyspnea, wheezing, chest pain, palpitations, orthopnea, edema, abdominal pain, nausea, melena, diarrhea, constipation, flank pain, dysuria, hematuria, urinary  Frequency, nocturia, numbness, tingling, seizures,  Focal weakness, Loss of consciousness,  Tremor, insomnia, depression, anxiety, and suicidal ideation.      Objective:  BP 122/74   Pulse 85   Temp 98 F (36.7 C)   Resp 16   Ht 5' (1.524 m)   Wt 146 lb 9.6 oz (66.5 kg)   SpO2 98%   BMI 28.63 kg/m   BP Readings from Last 3 Encounters:  09/11/22 122/74  07/28/22 (!) 140/72  06/16/22 (!) 154/74    Wt Readings from Last 3 Encounters:  09/11/22 146 lb 9.6 oz (66.5 kg)  07/28/22 150 lb (68 kg)  06/16/22 148 lb 9.6 oz (67.4 kg)    Physical Exam  No results found for: "HGBA1C"  Lab Results  Component Value Date   CREATININE 0.79 03/06/2022   CREATININE 0.76 12/07/2021   CREATININE 0.89 09/22/2021    Lab Results  Component Value Date   WBC 6.3 03/06/2022   HGB 14.8 03/06/2022   HCT 43.6 03/06/2022   PLT 228 03/06/2022   GLUCOSE  88 03/06/2022   CHOL 239 (H) 04/12/2021   TRIG 136.0 04/12/2021   HDL 51.40 04/12/2021   LDLDIRECT 175.5 03/19/2013   LDLCALC 160 (H) 04/12/2021   ALT 16 12/07/2021   AST 16 12/07/2021   NA 140 03/06/2022   K 4.5 03/06/2022   CL 102 03/06/2022   CREATININE 0.79 03/06/2022   BUN 17 03/06/2022   CO2 26 03/06/2022   TSH 2.809 05/10/2020   INR 1.3 (H) 11/26/2018    DG Lumbar Spine 2-3 Views  Result Date: 11/30/2021 CLINICAL DATA:  Back pain for 1 week. Worsening pain after falling today. EXAM: LUMBAR SPINE - 2-3 VIEW COMPARISON:  Lumbar MRI 08/03/2021 and abdominopelvic CT 07/20/2021. FINDINGS: There are 5 lumbar type vertebral bodies. Previously demonstrated superior endplate compression fracture at L3 appears stable. No acute lumbar spine fractures are identified. There are mild compression deformities within the lower  thoracic spine which are further described on separate examination of the thoracic spine. The bones are diffusely demineralized. The alignment is normal. Aortic atherosclerosis noted. IMPRESSION: Stable healing superior endplate compression fracture at L3. No acute lumbar spine fractures identified. See separate thoracic spine report. Electronically Signed   By: Richardean Sale M.D.   On: 11/30/2021 10:52   DG Thoracic Spine 2 View  Result Date: 11/30/2021 CLINICAL DATA:  Back pain for 1 week. Worsening pain after falling today. EXAM: THORACIC SPINE 2 VIEWS COMPARISON:  Chest CTA 11/25/2018.  Cardiac CT 12/25/2019. FINDINGS: There are small ribs at T12 and 12 rib-bearing thoracic type vertebral bodies. The bones are diffusely demineralized. There is a new biconcave compression fracture at T8 resulting in approximately 40% loss of vertebral body height. In addition, there are possible mild superior endplate compression fractures at T10, T11 and T12. No widening of the interpedicular distance. IMPRESSION: Several lower thoracic compression deformities appear new compared with available prior studies and could be acute/subacute. Electronically Signed   By: Richardean Sale M.D.   On: 11/30/2021 10:50    Assessment & Plan:  .There are no diagnoses linked to this encounter.   I provided 30 minutes of face-to-face time during this encounter reviewing patient's last visit with me, patient's  most recent visit with cardiology,  nephrology,  and neurology,  recent surgical and non surgical procedures, previous  labs and imaging studies, counseling on currently addressed issues,  and post visit ordering to diagnostics and therapeutics .   Follow-up: No follow-ups on file.   Crecencio Mc, MD

## 2022-09-12 DIAGNOSIS — N3941 Urge incontinence: Secondary | ICD-10-CM | POA: Insufficient documentation

## 2022-09-12 NOTE — Assessment & Plan Note (Signed)
Historically untreated due to patient preference,  But given the aortic atherosclerosis noted on recent cardiac CT, she has been tolerating high potency statin therapy  Lab Results  Component Value Date   CHOL 239 (H) 04/12/2021   HDL 51.40 04/12/2021   LDLCALC 160 (H) 04/12/2021   LDLDIRECT 175.5 03/19/2013   TRIG 136.0 04/12/2021   CHOLHDL 5 04/12/2021

## 2022-09-12 NOTE — Assessment & Plan Note (Signed)
Continue alendronate , started in June 2023 after she sustained 4 thoracic vertebral fractures (T8 with 40% reduction in height).  Repeat DEXA  due in June 2024.  Continue calcium supplementation and vitamin D supplementation for chronically low D

## 2022-09-12 NOTE — Assessment & Plan Note (Signed)
She denies dyspnea at rest and with walking.  Ambulatory HR is usually 105 or below via [employer].  

## 2022-09-12 NOTE — Assessment & Plan Note (Signed)
Aortic atherosclerosis :  Reviewed findings of prior CT scan today..  Patient is tolerating high potency statin therapy

## 2022-09-12 NOTE — Assessment & Plan Note (Signed)
Ruling out UTI, followed by trial of [place]

## 2022-09-14 ENCOUNTER — Telehealth: Payer: Self-pay | Admitting: Internal Medicine

## 2022-09-14 ENCOUNTER — Telehealth: Payer: Self-pay

## 2022-09-14 ENCOUNTER — Encounter: Payer: Self-pay | Admitting: Internal Medicine

## 2022-09-14 DIAGNOSIS — R911 Solitary pulmonary nodule: Secondary | ICD-10-CM

## 2022-09-14 DIAGNOSIS — R918 Other nonspecific abnormal finding of lung field: Secondary | ICD-10-CM

## 2022-09-14 LAB — URINE CULTURE
MICRO NUMBER:: 14736699
SPECIMEN QUALITY:: ADEQUATE

## 2022-09-14 MED ORDER — CIPROFLOXACIN HCL 250 MG PO TABS: 250.0000 mg | ORAL_TABLET | Freq: Two times a day (BID) | ORAL | 0 refills | Status: AC

## 2022-09-14 NOTE — Telephone Encounter (Signed)
Lft pt vm to call to ofc to sch CT. thanks

## 2022-09-14 NOTE — Telephone Encounter (Signed)
[facility] called with call report on chest xray. Report is in epic.

## 2022-09-14 NOTE — Telephone Encounter (Signed)
Pt is aware of results and gave a verbal understanding.  

## 2022-09-14 NOTE — Addendum Note (Signed)
Addended by: Crecencio Mc on: 09/14/2022 12:21 PM   Modules accepted: Orders

## 2022-09-14 NOTE — Addendum Note (Signed)
Addended by: Crecencio Mc on: 09/14/2022 12:22 AM   Modules accepted: Orders

## 2022-09-14 NOTE — Addendum Note (Signed)
Addended by: Crecencio Mc on: 09/14/2022 03:04 PM   Modules accepted: Orders

## 2022-09-15 DIAGNOSIS — Z1211 Encounter for screening for malignant neoplasm of colon: Secondary | ICD-10-CM | POA: Diagnosis not present

## 2022-09-18 ENCOUNTER — Telehealth: Payer: Self-pay | Admitting: Internal Medicine

## 2022-09-18 NOTE — Telephone Encounter (Signed)
Lft pt vm to call to ofc to sch CT. Thanks

## 2022-09-19 ENCOUNTER — Other Ambulatory Visit: Payer: Medicare Other

## 2022-09-20 LAB — COLOGUARD: COLOGUARD: NEGATIVE

## 2022-09-28 ENCOUNTER — Other Ambulatory Visit (INDEPENDENT_AMBULATORY_CARE_PROVIDER_SITE_OTHER): Payer: Medicare Other

## 2022-09-28 DIAGNOSIS — E875 Hyperkalemia: Secondary | ICD-10-CM | POA: Diagnosis not present

## 2022-09-29 LAB — BASIC METABOLIC PANEL
BUN: 15 mg/dL (ref 6–23)
CO2: 28 mEq/L (ref 19–32)
Calcium: 9 mg/dL (ref 8.4–10.5)
Chloride: 102 mEq/L (ref 96–112)
Creatinine, Ser: 0.88 mg/dL (ref 0.40–1.20)
GFR: 63.78 mL/min (ref >60.00)
Glucose, Bld: 110 mg/dL — ABNORMAL HIGH (ref 70–99)
Potassium: 4 mEq/L (ref 3.5–5.1)
Sodium: 139 mEq/L (ref 135–145)

## 2022-10-04 ENCOUNTER — Ambulatory Visit
Admission: RE | Admit: 2022-10-04 | Discharge: 2022-10-04 | Disposition: A | Discharge status: Home / Self Care | Payer: Medicare Other | Source: Ambulatory Visit | Attending: Internal Medicine | Admitting: Internal Medicine

## 2022-10-04 DIAGNOSIS — Z9189 Other specified personal risk factors, not elsewhere classified: Secondary | ICD-10-CM | POA: Diagnosis not present

## 2022-10-04 DIAGNOSIS — J984 Other disorders of lung: Secondary | ICD-10-CM | POA: Diagnosis not present

## 2022-10-04 DIAGNOSIS — R911 Solitary pulmonary nodule: Secondary | ICD-10-CM | POA: Insufficient documentation

## 2022-10-04 DIAGNOSIS — R918 Other nonspecific abnormal finding of lung field: Secondary | ICD-10-CM | POA: Insufficient documentation

## 2022-10-08 ENCOUNTER — Encounter: Payer: Self-pay | Admitting: Internal Medicine

## 2022-10-08 DIAGNOSIS — G4733 Obstructive sleep apnea (adult) (pediatric): Secondary | ICD-10-CM | POA: Insufficient documentation

## 2022-10-08 DIAGNOSIS — I272 Pulmonary hypertension, unspecified: Secondary | ICD-10-CM | POA: Insufficient documentation

## 2022-10-09 ENCOUNTER — Telehealth: Payer: Self-pay | Admitting: Internal Medicine

## 2022-10-09 DIAGNOSIS — R911 Solitary pulmonary nodule: Secondary | ICD-10-CM

## 2022-10-09 NOTE — Telephone Encounter (Signed)
Pt. Calling back to talk about Ct results and made her a apt to see kasa in may

## 2022-10-09 NOTE — Telephone Encounter (Signed)
Melissa Fulling, MD  Rosaland Lao, CMA; Bonney Leitz, CMA Patient needs repeat CT chest in 12 months, thank you(lung nodules).  Patient is aware of results/recommendations and voiced her understanding.  CT ordered. Nothing further needed.

## 2022-10-10 NOTE — Telephone Encounter (Signed)
Melissa Fulling, MD  Sherlene Shams, MD; Rosaland Lao, CMA; Bonney Leitz, CMA Patient will need repeat CT chest in 6 months, thank you follow  up lung nodule  Patient is aware and voiced her understanding. CT ordered. Nothing further needed.

## 2022-10-10 NOTE — Addendum Note (Signed)
Addended by: Lajoyce Lauber A on: 10/10/2022 04:16 PM   Modules accepted: Orders

## 2022-10-23 ENCOUNTER — Other Ambulatory Visit: Payer: Self-pay | Admitting: Internal Medicine

## 2022-11-01 ENCOUNTER — Ambulatory Visit: Payer: Medicare Other | Admitting: Internal Medicine

## 2022-11-01 ENCOUNTER — Encounter: Payer: Self-pay | Admitting: Internal Medicine

## 2022-11-01 VITALS — BP 122/78 | HR 64 | Temp 97.6°F | Ht 60.0 in | Wt 149.4 lb

## 2022-11-01 DIAGNOSIS — R911 Solitary pulmonary nodule: Secondary | ICD-10-CM | POA: Diagnosis not present

## 2022-11-01 DIAGNOSIS — G4733 Obstructive sleep apnea (adult) (pediatric): Secondary | ICD-10-CM | POA: Diagnosis not present

## 2022-11-01 NOTE — Patient Instructions (Signed)
FOLLOW UP CT CHEST IN 1 YEAR RECOMMEND TREATING BACK PAIN TO COMPLY WITH CPAP  Avoid secondhand smoke Avoid SICK contacts Recommend  Masking  when appropriate Recommend Keep up-to-date with vaccinations

## 2022-11-01 NOTE — Progress Notes (Signed)
[facility] Consultation    SYNOPSIS Melissa Bonilla is a 77 y.o. old female with a history of atrial fibrillation as well as heart failure with reduced ejection fraction. She was in the hospital in June with Afib, acute CHF and pneumonia. She would notice at that time she would wake herself up from sleep.  She falls asleep for 30 min if she sits down to read or watching tv late night.  No signs of compliant compression fracture has significantly hindered her therapy for sleep apnea    Date: 11/01/2022  MRN# [id number] Melissa Bonilla 11-24-1945  **CT chest 11/25/2018>> imaging personally reviewed, there is a bilateral groundglass changes, suggestive of pneumonitis or pulmonary vascular edema. **Chest x-ray 11/18/2018>> imaging personally reviewed, mild pulmonary vascular edema **Echocardiogram transesophageal 11/28/2018>> EF is 40% **Echocardiogram 11/26/2018>> EF is 35%.  Sleep study 02/07/19 Findings shows AHI 16  moderate OSA Diagnosis  CC Follow up OSA   Lung nodule on CT scan  HPI:    No exacerbation at this time No evidence of heart failure at this time No evidence or signs of infection at this time No respiratory distress No fevers, chills, nausea, vomiting, diarrhea No evidence of lower extremity edema No evidence hemoptysis  Patient has been noncompliant with CPAP therapy due to to compression fractures of her back and cannot lay still Patient is not taking any pain medications at this time Patient was advised by PCP to take Tylenol  Patient currently has no mask issues since her referral to [facility]   Prior to her compression fracture in 2022 patient has been very compliant with her CPAP machine and had excellent compliance report   Primary care physician ordered CT of the chest April 2024 shows left upper lobe nodule which has been there since 2020 With her prior smoking history will repeat CT chest in 1 year for interval assessment Findings  relayed with patient in detail     PMHX:   Past Medical History:  Diagnosis Date   Arrhythmia    atrial fibrillation   CHF (congestive heart failure) (HCC)    Depression    Hyperlipidemia    Hypertension    Menopausal syndrome (hot flashes) 1992   used HRT fo 2 yrs    Obstructive sleep apnea    Persistent atrial fibrillation (HCC)    Shingles rash 09/20/2021   Surgical Hx:  Past Surgical History:  Procedure Laterality Date   APPENDECTOMY  1972   ATRIAL FIBRILLATION ABLATION N/A 04/09/2020   Procedure: ATRIAL FIBRILLATION ABLATION;  Surgeon: Lanier Prude, MD;  Location: [facility] INVAS[facility];  Service: Cardiovascular;  Laterality: N/A;   CARDIOVERSION N/A 11/28/2018   Procedure: CARDIOVERSION;  Surgeon: Iran Ouch, MD;  Location: [facility] ORS;  Service: Cardiovascular;  Laterality: N/A;   CARDIOVERSION N/A 12/23/2018   Procedure: CARDIOVERSION;  Surgeon: Iran Ouch, MD;  Location: [facility] ORS;  Service: Cardiovascular;  Laterality: N/A;   CARDIOVERSION N/A 09/30/2019   Procedure: CARDIOVERSION;  Surgeon: Yvonne Kendall, MD;  Location: [facility] ORS;  Service: Cardiovascular;  Laterality: N/A;   CARDIOVERSION N/A 09/30/2021   Procedure: CARDIOVERSION;  Surgeon: Quintella Reichert, MD;  Location: [facility] ENDOSCOPY;  Service: Cardiovascular;  Laterality: N/A;   CESAREAN SECTION     [id number], 1972   TEE WITHOUT CARDIOVERSION N/A 11/28/2018   Procedure: TRANSESOPHAGEAL ECHOCARDIOGRAM (TEE);  Surgeon: Iran Ouch, MD;  Location: [facility] ORS;  Service: Cardiovascular;  Laterality: N/A;   Family Hx:  Family History  Problem Relation Age of Onset   Arthritis Mother        Rheumatoid   Heart disease Mother        Valvular   Hypertension Mother    Heart disease Father 89       CABG   Arthritis Maternal Grandmother        Rheumatoid   Hypertension Maternal Grandmother    Stroke Maternal Grandmother    Breast cancer Paternal Aunt 60   Breast cancer Paternal Aunt 36   Social Hx:    Social History   Tobacco Use   Smoking status: Former    Packs/day: 0.25    Years: 15.00    Additional pack years: 0.00    Total pack years: 3.75    Types: Cigarettes    Quit date: 1992    Years since quitting: 32.3   Smokeless tobacco: Never   Tobacco comments:    Former smoker 09/22/21  Vaping Use   Vaping Use: Never used  Substance Use Topics   Alcohol use: Not Currently    Comment: 1/2 glass of wine on occassion   Drug use: No   Medication:    Current Outpatient Medications:    acetaminophen (TYLENOL) 650 MG CR tablet, Take 1,300 mg by mouth every 8 (eight) hours as needed for pain., Disp: , Rfl:    alendronate (FOSAMAX) 70 MG tablet, Take 1 tablet (70 mg total) by mouth every 7 (seven) days. Take with a full glass of water on an empty stomach., Disp: 4 tablet, Rfl: 11   ascorbic acid (VITAMIN C) 500 MG tablet, Take 500 mg by mouth daily., Disp: , Rfl:    cholecalciferol (VITAMIN D) 25 MCG (1000 UNIT) tablet, Take 1,000 Units by mouth daily., Disp: , Rfl:    Coenzyme Q10 (COQ10) 200 MG CAPS, Take 1 capsule by mouth daily., Disp: , Rfl:    diltiazem (CARDIZEM CD) 180 MG 24 hr capsule, Take 1 capsule (180 mg total) by mouth 2 (two) times daily., Disp: 180 capsule, Rfl: 2   ELIQUIS 5 MG TABS tablet, TAKE 1 TABLET(5 MG) BY MOUTH TWICE DAILY, Disp: 180 tablet, Rfl: 1   losartan (COZAAR) 25 MG tablet, TAKE 1/2 TABLET(12.5 MG) BY MOUTH DAILY, Disp: 45 tablet, Rfl: 0   Magnesium 400 MG CAPS, Take 1 capsule by mouth daily at 2 am., Disp: , Rfl:    Multiple Vitamin (MULTIVITAMIN WITH MINERALS) TABS tablet, Take 1 tablet by mouth daily., Disp: , Rfl:    rosuvastatin (CRESTOR) 10 MG tablet, Take 1 tablet (10 mg total) by mouth 2 (two) times a week., Disp: 24 tablet, Rfl: 3   vitamin B-12 (CYANOCOBALAMIN) 1000 MCG tablet, Take 1,000 mcg by mouth daily., Disp: , Rfl:    trospium (SANCTURA) 20 MG tablet, Take 1 tablet (20 mg total) by mouth 2 (two) times daily. (Patient not taking:  Reported on 11/01/2022), Disp: 60 tablet, Rfl: 2   Allergies:  Codeine, Morphine, and Sulfa antibiotics   BP 122/78 (BP Location: Left Arm, Cuff Size: Normal)   Pulse 64   Temp 97.6 F (36.4 C) (Temporal)   Ht 5' (1.524 m)   Wt 149 lb 6.4 oz (67.8 kg)   SpO2 97%   BMI 29.18 kg/m       Review of Systems: Gen:  Denies  fever, sweats, chills weight loss  HEENT: Denies blurred vision, double vision, ear pain, eye pain, hearing loss, nose bleeds, sore throat Cardiac:  No dizziness, chest pain or heaviness,  chest tightness,edema, No JVD Resp:   No cough, -sputum production, -shortness of breath,-wheezing, -hemoptysis,  Other:  All other systems negative   Physical Examination:   General Appearance: No distress  EYES PERRLA, EOM intact.   NECK Supple, No JVD Pulmonary: normal breath sounds, No wheezing.  CardiovascularNormal S1,S2.  No m/r/g.   Abdomen: Benign, Soft, non-tender. Neurology UE/LE 5/5 strength, no focal deficits Ext pulses intact, cap refill intact ALL OTHER ROS ARE NEGATIVE       Assessment and Plan:  77 yo white female with Excessive daytime sleepiness Moderate OSA AHI 16 Patient is currently noncompliant with her CPAP due to severe back pain related to compression fractures of her spine, and CT scan done by primary care physician to assess for lung nodules which seem to be benign at this time  MODERATE OSA Patient has been noncompliant with CPAP due to severe back pain related to compression fracture since December 2022 Prior to to the state patient has been very compliant with her CPAP  nasal pillows Commending treating her back pain so that she is compliant with her CPAP Advised to take Tylenol as prescribed by her primary care physician  Atrial fibrillation a flutter history of CHF Importance of sleep apnea on therapy relayed to patient Follow-up cardiology A-fib a flutter ablation therapy was canceled due to back pain Follow-up necessary as  needed  Abnormal CT chest left upper lobe nodule benign in appearance findings compared to 2020 unchanged Repeat CT chest in 1 year  MEDICATION ADJUSTMENTS/LABS AND TESTS ORDERED: FOLLOW UP CT CHEST IN 1 YEAR RECOMMEND TREATING BACK PAIN TO COMPLY WITH CPAP  Avoid secondhand smoke Avoid SICK contacts Recommend  Masking  when appropriate Recommend Keep up-to-date with vaccinations   CURRENT MEDICATIONS REVIEWED AT LENGTH WITH PATIENT TODAY   Patient satisfied with Plan of action and management. All questions answered  Follow-up in 1 year  Total time spent 31 mins  Evonda Enge Santiago Glad, M.D.  [facility]  Medical Director [facility] Medical Director [facility] Cardio-Pulmonary Department

## 2022-11-02 ENCOUNTER — Other Ambulatory Visit: Payer: Self-pay

## 2022-11-02 MED ORDER — ALENDRONATE SODIUM 70 MG PO TABS: 70.0000 mg | ORAL_TABLET | ORAL | 11 refills | Status: DC

## 2022-11-09 ENCOUNTER — Telehealth: Payer: Self-pay | Admitting: Internal Medicine

## 2022-11-09 ENCOUNTER — Ambulatory Visit (INDEPENDENT_AMBULATORY_CARE_PROVIDER_SITE_OTHER): Payer: Medicare Other | Admitting: Nurse Practitioner

## 2022-11-09 VITALS — BP 124/70 | HR 65 | Temp 98.6°F | Ht 60.0 in | Wt 149.8 lb

## 2022-11-09 DIAGNOSIS — R600 Localized edema: Secondary | ICD-10-CM | POA: Diagnosis not present

## 2022-11-09 LAB — CBC WITH DIFFERENTIAL/PLATELET
Basophils Absolute: 0 10*3/uL (ref 0.0–0.1)
Basophils Relative: 0.7 % (ref 0.0–3.0)
Eosinophils Absolute: 0.2 10*3/uL (ref 0.0–0.7)
Eosinophils Relative: 2.6 % (ref 0.0–5.0)
HCT: 40.8 % (ref 36.0–46.0)
Hemoglobin: 13.8 g/dL (ref 12.0–15.0)
Lymphocytes Relative: 38 % (ref 12.0–46.0)
Lymphs Abs: 2.2 10*3/uL (ref 0.7–4.0)
MCHC: 33.9 g/dL (ref 30.0–36.0)
MCV: 91.2 fl (ref 78.0–100.0)
Monocytes Absolute: 0.7 10*3/uL (ref 0.1–1.0)
Monocytes Relative: 11.6 % (ref 3.0–12.0)
Neutro Abs: 2.8 10*3/uL (ref 1.4–7.7)
Neutrophils Relative %: 47.1 % (ref 43.0–77.0)
Platelets: 213 10*3/uL (ref 150.0–400.0)
RBC: 4.47 Mil/uL (ref 3.87–5.11)
RDW: 12.9 % (ref 11.5–15.5)
WBC: 5.9 10*3/uL (ref 4.0–10.5)

## 2022-11-09 LAB — COMPREHENSIVE METABOLIC PANEL
ALT: 12 U/L (ref 0–35)
AST: 17 U/L (ref 0–37)
Albumin: 4.1 g/dL (ref 3.5–5.2)
Alkaline Phosphatase: 73 U/L (ref 39–117)
BUN: 13 mg/dL (ref 6–23)
CO2: 30 mEq/L (ref 19–32)
Calcium: 9.3 mg/dL (ref 8.4–10.5)
Chloride: 104 mEq/L (ref 96–112)
Creatinine, Ser: 0.92 mg/dL (ref 0.40–1.20)
GFR: 60.42 mL/min (ref >60.00)
Glucose, Bld: 87 mg/dL (ref 70–99)
Potassium: 4.6 mEq/L (ref 3.5–5.1)
Sodium: 138 mEq/L (ref 135–145)
Total Bilirubin: 0.4 mg/dL (ref 0.2–1.2)
Total Protein: 6.6 g/dL (ref 6.0–8.3)

## 2022-11-09 LAB — BRAIN NATRIURETIC PEPTIDE: Pro B Natriuretic peptide (BNP): 30 pg/mL (ref 0.0–100.0)

## 2022-11-09 NOTE — Progress Notes (Signed)
Melissa Dicker, NP-C Phone: [phone]  Melissa Bonilla is a 77 y.o. female who presents today for bilateral lower extremity edema.   Patient with bilateral ankle and lower leg swelling for the past 2 weeks. Her legs and ankles feel tight. The swelling resolves over night and with elevation then returns after being on her feet. She is concerned the swelling is due to being on Fosamax. She has recently started taking it when the swelling began. She had tried taking it in the past and stopped due to adverse side effects. Denies shortness of breath. Denies chest pain. Denies pain in her legs.   Social History   Tobacco Use  Smoking Status Former   Packs/day: 0.25   Years: 15.00   Additional pack years: 0.00   Total pack years: 3.75   Types: Cigarettes   Quit date: 1992   Years since quitting: 32.4  Smokeless Tobacco Never  Tobacco Comments   Former smoker 09/22/21    Current Outpatient Medications on File Prior to Visit  Medication Sig Dispense Refill   acetaminophen (TYLENOL) 650 MG CR tablet Take 1,300 mg by mouth every 8 (eight) hours as needed for pain.     ascorbic acid (VITAMIN C) 500 MG tablet Take 500 mg by mouth daily.     cholecalciferol (VITAMIN D) 25 MCG (1000 UNIT) tablet Take 1,000 Units by mouth daily.     Coenzyme Q10 (COQ10) 200 MG CAPS Take 1 capsule by mouth daily.     diltiazem (CARDIZEM CD) 180 MG 24 hr capsule Take 1 capsule (180 mg total) by mouth 2 (two) times daily. 180 capsule 2   ELIQUIS 5 MG TABS tablet TAKE 1 TABLET(5 MG) BY MOUTH TWICE DAILY 180 tablet 1   losartan (COZAAR) 25 MG tablet TAKE 1/2 TABLET(12.5 MG) BY MOUTH DAILY 45 tablet 0   Magnesium 400 MG CAPS Take 1 capsule by mouth daily at 2 am.     Multiple Vitamin (MULTIVITAMIN WITH MINERALS) TABS tablet Take 1 tablet by mouth daily.     rosuvastatin (CRESTOR) 10 MG tablet Take 1 tablet (10 mg total) by mouth 2 (two) times a week. 24 tablet 3   vitamin B-12 (CYANOCOBALAMIN) 1000 MCG tablet Take  1,000 mcg by mouth daily.     No current facility-administered medications on file prior to visit.     ROS see history of present illness  Objective  Physical Exam Vitals:   11/09/22 1258 11/09/22 1316  BP: (!) 140/64 124/70  Pulse: 65   Temp: 98.6 F (37 C)   SpO2: 95%     BP Readings from Last 3 Encounters:  11/09/22 124/70  11/01/22 122/78  09/11/22 122/74   Wt Readings from Last 3 Encounters:  11/09/22 149 lb 12.8 oz (67.9 kg)  11/01/22 149 lb 6.4 oz (67.8 kg)  09/11/22 146 lb 9.6 oz (66.5 kg)    Physical Exam Constitutional:      General: She is not in acute distress.    Appearance: Normal appearance.  HENT:     Head: Normocephalic.  Cardiovascular:     Rate and Rhythm: Normal rate and regular rhythm.     Heart sounds: Normal heart sounds.  Pulmonary:     Effort: Pulmonary effort is normal.     Breath sounds: Normal breath sounds.  Musculoskeletal:        General: Normal range of motion.     Right lower leg: 2+ Edema present.     Left lower leg: 2+ Edema  present.  Skin:    General: Skin is warm and dry.  Neurological:     General: No focal deficit present.     Mental Status: She is alert.  Psychiatric:        Mood and Affect: Mood normal.        Behavior: Behavior normal.    Assessment/Plan: Please see individual problem list.  Bilateral lower extremity edema Assessment & Plan: Edema present in bilateral lower extremities- feet and ankles. Non-pitting. She would like to stop her Fosamax and monitor to see if this is the cause for her swelling. Will check lab work as outlined. She will discontinue her Fosamax and follow up in 2 weeks. Advised decreasing sodium intake and elevating legs throughout the day. Strict return precautions given to patient.   Orders: -     CBC with Differential/Platelet -     Brain natriuretic peptide -     Comprehensive metabolic panel   Return in about 2 weeks (around 11/23/2022) for Follow up.   Melissa Dicker,  NP-C [facility] [employer]

## 2022-11-09 NOTE — Telephone Encounter (Signed)
Patient called office back and made an appt. For today, 11/09/22 with Konrad Dolores.

## 2022-11-09 NOTE — Telephone Encounter (Signed)
Pt called stating her ankles are swelling and she want to know if its one of her medications

## 2022-11-14 NOTE — Assessment & Plan Note (Addendum)
Edema present in bilateral lower extremities- feet and ankles. Non-pitting. She would like to stop her Fosamax and monitor to see if this is the cause for her swelling. Will check lab work as outlined. She will discontinue her Fosamax and follow up in 2 weeks. Advised decreasing sodium intake and elevating legs throughout the day. Strict return precautions given to patient.

## 2022-11-21 DIAGNOSIS — G4733 Obstructive sleep apnea (adult) (pediatric): Secondary | ICD-10-CM | POA: Diagnosis not present

## 2022-11-28 ENCOUNTER — Ambulatory Visit (INDEPENDENT_AMBULATORY_CARE_PROVIDER_SITE_OTHER): Payer: Medicare Other | Admitting: Nurse Practitioner

## 2022-11-28 ENCOUNTER — Encounter: Payer: Self-pay | Admitting: Nurse Practitioner

## 2022-11-28 VITALS — BP 122/66 | HR 71 | Temp 97.9°F | Ht 60.0 in | Wt 150.8 lb

## 2022-11-28 DIAGNOSIS — R600 Localized edema: Secondary | ICD-10-CM

## 2022-11-28 MED ORDER — FUROSEMIDE 20 MG PO TABS
20.0000 mg | ORAL_TABLET | Freq: Every day | ORAL | 2 refills | Status: DC
Start: 2022-11-28 — End: 2023-01-29

## 2022-11-28 NOTE — Assessment & Plan Note (Signed)
Patient continues to have bilateral lower extremity edema. Lab work- WNL. Will start patient on Lasix 20 mg daily. She will return for lab work in 2 weeks. Advised to follow up with Cardiology. She will continue to elevate her legs and decrease her salt intake. Recommended compression stockings during the day. Strict return precautions given to patient.

## 2022-11-28 NOTE — Progress Notes (Signed)
Bethanie Dicker, NP-C Phone: [phone]  Melissa Bonilla is a 77 y.o. female who presents today for follow up.   Patient was seen on 11/09/2022 for bilateral lower extremity swelling. She believed this was due to her Fosamax. She has since stopped the medication and continues to have swelling. Lab work reviewed. There has been no change in the swelling. The swelling is less in the mornings and gets worse throughout the day. She has been elevating her legs and decreasing her salt intake. She denies chest pain. Denies shortness of breath. Denies pain in her legs.   Social History   Tobacco Use  Smoking Status Former   Packs/day: 0.25   Years: 15.00   Additional pack years: 0.00   Total pack years: 3.75   Types: Cigarettes   Quit date: 1992   Years since quitting: 32.4  Smokeless Tobacco Never  Tobacco Comments   Former smoker 09/22/21    Current Outpatient Medications on File Prior to Visit  Medication Sig Dispense Refill   acetaminophen (TYLENOL) 650 MG CR tablet Take 1,300 mg by mouth every 8 (eight) hours as needed for pain.     ascorbic acid (VITAMIN C) 500 MG tablet Take 500 mg by mouth daily.     cholecalciferol (VITAMIN D) 25 MCG (1000 UNIT) tablet Take 1,000 Units by mouth daily.     Coenzyme Q10 (COQ10) 200 MG CAPS Take 1 capsule by mouth daily.     diltiazem (CARDIZEM CD) 180 MG 24 hr capsule Take 1 capsule (180 mg total) by mouth 2 (two) times daily. 180 capsule 2   ELIQUIS 5 MG TABS tablet TAKE 1 TABLET(5 MG) BY MOUTH TWICE DAILY 180 tablet 1   losartan (COZAAR) 25 MG tablet TAKE 1/2 TABLET(12.5 MG) BY MOUTH DAILY 45 tablet 0   Magnesium 400 MG CAPS Take 1 capsule by mouth daily at 2 am.     Multiple Vitamin (MULTIVITAMIN WITH MINERALS) TABS tablet Take 1 tablet by mouth daily.     rosuvastatin (CRESTOR) 10 MG tablet Take 1 tablet (10 mg total) by mouth 2 (two) times a week. 24 tablet 3   vitamin B-12 (CYANOCOBALAMIN) 1000 MCG tablet Take 1,000 mcg by mouth daily.      No current facility-administered medications on file prior to visit.    ROS see history of present illness  Objective  Physical Exam Vitals:   11/28/22 1346  BP: 122/66  Pulse: 71  Temp: 97.9 F (36.6 C)  SpO2: 96%    BP Readings from Last 3 Encounters:  11/28/22 122/66  11/09/22 124/70  11/01/22 122/78   Wt Readings from Last 3 Encounters:  11/28/22 150 lb 12.8 oz (68.4 kg)  11/09/22 149 lb 12.8 oz (67.9 kg)  11/01/22 149 lb 6.4 oz (67.8 kg)    Physical Exam Constitutional:      General: She is not in acute distress.    Appearance: Normal appearance.  HENT:     Head: Normocephalic.  Cardiovascular:     Rate and Rhythm: Normal rate and regular rhythm.     Heart sounds: Normal heart sounds.  Pulmonary:     Effort: Pulmonary effort is normal.     Breath sounds: Normal breath sounds.  Musculoskeletal:     Right lower leg: No tenderness. 2+ Edema present.     Left lower leg: No tenderness. 2+ Edema present.  Skin:    General: Skin is warm and dry.  Neurological:     General: No focal deficit present.  Mental Status: She is alert.  Psychiatric:        Mood and Affect: Mood normal.        Behavior: Behavior normal.    Assessment/Plan: Please see individual problem list.  Bilateral lower extremity edema Assessment & Plan: Patient continues to have bilateral lower extremity edema. Lab work- WNL. Will start patient on Lasix 20 mg daily. She will return for lab work in 2 weeks. Advised to follow up with Cardiology. She will continue to elevate her legs and decrease her salt intake. Recommended compression stockings during the day. Strict return precautions given to patient.   Orders: -     Basic metabolic panel; Future -     Furosemide; Take 1 tablet (20 mg total) by mouth daily.  Dispense: 30 tablet; Refill: 2   Return in about 2 weeks (around 12/12/2022), or if symptoms worsen or fail to improve, for lab work.   Bethanie Dicker, NP-C [facility]  - [employer]

## 2022-11-30 ENCOUNTER — Telehealth: Payer: Self-pay | Admitting: Internal Medicine

## 2022-11-30 NOTE — Telephone Encounter (Signed)
Copied from CRM [id number]. Topic: Medicare AWV >> Nov 30, 2022  2:03 PM Payton Doughty wrote: Reason for CRM: LM 11/30/2022 to schedule AWV   Verlee Rossetti; Care Guide Ambulatory Clinical Support [place] Direct Dial: [phone]

## 2022-12-08 ENCOUNTER — Other Ambulatory Visit: Payer: Self-pay | Admitting: Internal Medicine

## 2022-12-12 ENCOUNTER — Other Ambulatory Visit (INDEPENDENT_AMBULATORY_CARE_PROVIDER_SITE_OTHER): Payer: Medicare Other

## 2022-12-12 DIAGNOSIS — R600 Localized edema: Secondary | ICD-10-CM

## 2022-12-12 LAB — BASIC METABOLIC PANEL
BUN: 19 mg/dL (ref 6–23)
CO2: 31 mEq/L (ref 19–32)
Calcium: 9.8 mg/dL (ref 8.4–10.5)
Chloride: 102 mEq/L (ref 96–112)
Creatinine, Ser: 0.86 mg/dL (ref 0.40–1.20)
GFR: 65.47 mL/min (ref >60.00)
Glucose, Bld: 101 mg/dL — ABNORMAL HIGH (ref 70–99)
Potassium: 4.7 mEq/L (ref 3.5–5.1)
Sodium: 139 mEq/L (ref 135–145)

## 2022-12-18 NOTE — Progress Notes (Unsigned)
Cardiology Office Note    Date:  12/19/2022   ID:  Melissa Bonilla, Melissa Bonilla 09/18/1945, MRN [id number]  PCP:  Sherlene Shams, MD  Cardiologist:  Yvonne Kendall, MD  Electrophysiologist:  Lanier Prude, MD   Chief Complaint: Lower extremity swelling  History of Present Illness:   Melissa Bonilla is a 77 y.o. female with history of nonobstructive CAD, persistent A-fib and flutter on apixaban, HFrEF with subsequent normalization of LV systolic function, HTN, HLD, osteoporosis with compression fracture, OSA on CPAP, and depression who presents for evaluation of lower extremity swelling.   She was diagnosed with A-fib in 11/2018.  Echo at that time showed an EF of 35 to 40%, diffuse hypokinesis, normal RV systolic function, mildly to moderately dilated left atrium, mildly dilated right atrium, moderate mitral regurgitation, mild to moderate tricuspid regurgitation, tricuspid aortic valve, and normal size/structure aortic root.  Following diuresis, she underwent TEE guided DCCV in 11/2018 with TEE showing an EF of 40 to 45%.  Following procedure, she was noted to have frequent PVCs and what seemed to be a run of atrial flutter with 2-1 AV block.  Cardioversion did not last as she was noted to be back in A-fib with RVR the following day.  Upon presentation for repeat DCCV in 12/2018, she was noted to have spontaneously converted to sinus rhythm.  Echo in 01/2019, in sinus rhythm, showed an EF of 50 to 55%, diastolic dysfunction, normal RV systolic function and ventricular cavity size, and mild to moderate mitral regurgitation.  Most recent echo from 11/2019 demonstrated an EF of 55 to 60%, no regional wall motion abnormalities, grade 2 diastolic dysfunction, normal RV systolic function and ventricular cavity size, severely dilated left atrium, mild mitral regurgitation, and aortic valve sclerosis without evidence of stenosis.  Coronary CTA in 12/2019 showed a calcium score of 33.6 which was the 50th percentile  with 0 to 24% proximal LAD stenosis noted.  She has been followed closely by EP in the A-fib clinic for her A-fib/flutter and underwent A-fib ablation in 03/2020.   She was found to be in atrial flutter with RVR in 09/2021 at PCPs office and A-fib clinic.  She subsequently underwent DCCV on 09/30/2021.  Following this, due to recurrent palpitations she underwent Zio patch monitoring in 11/2021 which showed a 38% burden of A-fib/flutter.  Given this, she was scheduled for an A-fib ablation on 03/27/2022.  However, this had to be canceled secondary to the patient having suffered a compression fracture.  She was seen in the office in 05/2022 and was without symptoms of angina or decompensation.  She had not had any further palpitations consistent with prior A-fib/flutter.  She did note she was under significant stress surrounding her back pain when she underwent prior outpatient cardiac monitoring.  Given improvement in symptoms, she wondered if her A-fib burden was as significant.  In this setting, she underwent repeat outpatient cardiac monitoring with Zio patch in 05/2022 showing a predominant rhythm of sinus with an average rate of 66 bpm (range 48 to 106 bpm in sinus), single episode of NSVT occurred lasting 8 beats, 169 episodes of SVT lasting up to 11 beats, PAF/flutter was noted with an overall burden of less than 1% with the longest episode lasting 2 hours and 23 minutes.  Rare PACs and PVCs.  Patient triggered events corresponded to sinus rhythm and A-fib/flutter with RVR.  She was last seen in the office in 07/2022 and remained without symptoms of angina  or cardiac decompensation.  She was continued on apixaban and diltiazem.  She was evaluated at her primary care office in 10/2022 for lower extremity swelling that was more progressive throughout the day.  BNP 30.  She was advised to follow-up with cardiology.  She comes in today noting a couple month history of increasing bilateral lower extremity swelling.   Swelling is improved in the morning and with leg elevation and progresses throughout the day with ambulation and having her feet down.  Swelling is slightly more noticeable in the right lower extremity when compared to the left with a prior history of ankle fracture in the right.  No progressive orthopnea.  She does notice some early satiety.  Also notes a couple week history of exertional fatigue without dyspnea or frank angina.  No presyncope or syncope.  Brief episodes of dizziness.  Her weight is down 1 pound by our scale when compared to her last visit in our office (07/2022).  She has not noted a significant increase in urine output on furosemide.  No dietary changes.   Labs independently reviewed: 11/2022 - potassium 4.7, BUN 19, serum creatinine 0.86 10/2022 - albumin 4.1, AST/ALT normal, BNP 30, Hgb 13.8, PLT 213 03/2021 - TC 239, TG 136, HDL 51, LDL 160 04/2020 - TSH normal  Past Medical History:  Diagnosis Date   Arrhythmia    atrial fibrillation   CHF (congestive heart failure) (HCC)    Depression    Hyperlipidemia    Hypertension    Menopausal syndrome (hot flashes) 1992   used HRT fo 2 yrs    Obstructive sleep apnea    Persistent atrial fibrillation (HCC)    Shingles rash 09/20/2021    Past Surgical History:  Procedure Laterality Date   APPENDECTOMY  1972   ATRIAL FIBRILLATION ABLATION N/A 04/09/2020   Procedure: ATRIAL FIBRILLATION ABLATION;  Surgeon: Lanier Prude, MD;  Location: [facility];  Service: Cardiovascular;  Laterality: N/A;   CARDIOVERSION N/A 11/28/2018   Procedure: CARDIOVERSION;  Surgeon: Iran Ouch, MD;  Location: [facility] ORS;  Service: Cardiovascular;  Laterality: N/A;   CARDIOVERSION N/A 12/23/2018   Procedure: CARDIOVERSION;  Surgeon: Iran Ouch, MD;  Location: [facility] ORS;  Service: Cardiovascular;  Laterality: N/A;   CARDIOVERSION N/A 09/30/2019   Procedure: CARDIOVERSION;  Surgeon: Yvonne Kendall, MD;  Location: [facility] ORS;  Service:  Cardiovascular;  Laterality: N/A;   CARDIOVERSION N/A 09/30/2021   Procedure: CARDIOVERSION;  Surgeon: Quintella Reichert, MD;  Location: [facility] ENDOSCOPY;  Service: Cardiovascular;  Laterality: N/A;   CESAREAN SECTION     [id number], 1972   TEE WITHOUT CARDIOVERSION N/A 11/28/2018   Procedure: TRANSESOPHAGEAL ECHOCARDIOGRAM (TEE);  Surgeon: Iran Ouch, MD;  Location: [facility] ORS;  Service: Cardiovascular;  Laterality: N/A;    Current Medications: Current Meds  Medication Sig   acetaminophen (TYLENOL) 650 MG CR tablet Take 1,300 mg by mouth every 8 (eight) hours as needed for pain.   ascorbic acid (VITAMIN C) 500 MG tablet Take 500 mg by mouth daily.   carvedilol (COREG) 6.25 MG tablet Take 1 tablet (6.25 mg total) by mouth 2 (two) times daily.   cholecalciferol (VITAMIN D) 25 MCG (1000 UNIT) tablet Take 1,000 Units by mouth daily.   Coenzyme Q10 (COQ10) 200 MG CAPS Take 1 capsule by mouth daily.   ELIQUIS 5 MG TABS tablet TAKE 1 TABLET(5 MG) BY MOUTH TWICE DAILY   furosemide (LASIX) 20 MG tablet Take 1 tablet (20 mg total) by  mouth daily.   losartan (COZAAR) 25 MG tablet TAKE 1/2 TABLET(12.5 MG) BY MOUTH DAILY   Magnesium 400 MG CAPS Take 1 capsule by mouth daily at 2 am.   Multiple Vitamin (MULTIVITAMIN WITH MINERALS) TABS tablet Take 1 tablet by mouth daily.   rosuvastatin (CRESTOR) 10 MG tablet Take 1 tablet (10 mg total) by mouth 2 (two) times a week.   vitamin B-12 (CYANOCOBALAMIN) 1000 MCG tablet Take 1,000 mcg by mouth daily.   [DISCONTINUED] diltiazem (CARDIZEM CD) 180 MG 24 hr capsule Take 1 capsule (180 mg total) by mouth 2 (two) times daily.    Allergies:   Codeine, Morphine, and Sulfa antibiotics   Social History   Socioeconomic History   Marital status: Married    Spouse name: Not on file   Number of children: Not on file   Years of education: Not on file   Highest education level: Not on file  Occupational History    Employer: self    Comment: Owner of a cleaning  Business  Tobacco Use   Smoking status: Former    Packs/day: 0.25    Years: 15.00    Additional pack years: 0.00    Total pack years: 3.75    Types: Cigarettes    Quit date: 1992    Years since quitting: 32.5   Smokeless tobacco: Never   Tobacco comments:    Former smoker 09/22/21  Vaping Use   Vaping Use: Never used  Substance and Sexual Activity   Alcohol use: Not Currently    Comment: 1/2 glass of wine on occassion   Drug use: No   Sexual activity: Not on file  Other Topics Concern   Not on file  Social History Narrative   Not on file   Social Determinants of Health   Financial Resource Strain: Low Risk  (12/16/2021)   Overall Financial Resource Strain (CARDIA)    Difficulty of Paying Living Expenses: Not hard at all  Food Insecurity: No Food Insecurity (12/16/2021)   Hunger Vital Sign    Worried About Running Out of Food in the Last Year: Never true    Ran Out of Food in the Last Year: Never true  Transportation Needs: No Transportation Needs (12/16/2021)   PRAPARE - Administrator, Civil Service (Medical): No    Lack of Transportation (Non-Medical): No  Physical Activity: Not on file  Stress: No Stress Concern Present (12/16/2021)   [employer] of Occupational Health - Occupational Stress Questionnaire    Feeling of Stress : Not at all  Social Connections: Unknown (12/16/2021)   Social Connection and Isolation Panel [NHANES]    Frequency of Communication with Friends and Family: Not on file    Frequency of Social Gatherings with Friends and Family: Not on file    Attends Religious Services: Not on file    Active Member of Clubs or Organizations: Not on file    Attends Banker Meetings: Not on file    Marital Status: Married     Family History:  The patient's family history includes Arthritis in her maternal grandmother and mother; Breast cancer (age of onset: 48) in her paternal aunt; Breast cancer (age of onset: 57) in her paternal  aunt; Heart disease in her mother; Heart disease (age of onset: 58) in her father; Hypertension in her maternal grandmother and mother; Stroke in her maternal grandmother.  ROS:   12-point review of systems is negative unless otherwise noted in the HPI.  EKGs/Labs/Other Studies Reviewed:    Studies reviewed were summarized above. The additional studies were reviewed today:  Zio patch 05/2022:   The patient was monitored for 14 days.   The predominant rhythm was sinus with an average rate of 66 bpm (48-106 bpm in sinus).   There were rare PAC's and PVC's.   A single episode of nonsustained ventricular tachycardia occurred, lasting 8 beats with a maximum rate of 190 bpm.   There were 169 supraventricular runs, lasting up to 11 beats with a maximum rate of 176 bpm.   Paroxysmal atrial fibrillation/flutter was observed (burden <1%).  The average ventricular rate was 124 bpm (range 84-164 bpm).  The longest episode lasted 2 hours, 23 minutes.   Patient triggered events correspond to sinus rhythm and atrial fibrillation/flutter with rapid ventricular response.   Predominantly sinus rhythm with paroxysmal atrial fibrillation/flutter, as detailed above (atrial fibrillation/flutter burden <1%).  Multiple episodes of PSVT were observed, as well as rare PAC's and PVC's. __________   Luci [place] patch 11/2021: HR 54 - 199 bpm, average 89 bpm. 1 NSVT lasting 9 beats. 38% burden of AF/AFL with rates 67-199 bpm, average 122 bpm. 2 pauses, longest 3.6 seconds. Symptom triggered episode corresponds to AF. Occasional supraventricular ectopy, 1.2%. Rare ventricular ectopy. __________   A-fib ablation 04/09/2020: CONCLUSIONS: 1. Successful PVI 2. Successful ablation/isolation of the posterior wall 3. Intracardiac echo reveals normal left ventricular function, dilated left atrium 4. No early apparent complications. 5. Continue amiodarone for now. Will discuss stopping this at the 55-month follow-up  appointment based on rhythm over the next 3 months.  __________   Coronary CTA 12/25/2019: Aorta: Normal size. Aortic root and descending aortic wall calcifications noted. No dissection.   Aortic Valve:  Trileaflet.  No calcifications.   Coronary Arteries:  Normal coronary origin.  Right dominance.   RCA is a large dominant artery that gives rise to PDA and PLA. There is no plaque.   Left main is a large artery that gives rise to LAD and LCX arteries.   LAD is a large vessel that has mild calcified plaque in the proximal segment causing minimal stenosis (0-24%).   LCX is a non-dominant artery that gives rise to two obtuse marginal branches. There is no plaque.   Other findings:   Normal pulmonary vein drainage into the left atrium.   Normal left atrial appendage without a thrombus.   Normal size of the pulmonary artery.   IMPRESSION: 1. Low coronary calcium score of 33.6. This was 50th percentile for age and sex matched control. 2. Normal coronary origin with right dominance. 3. Mild calcified plaque in the proximal LAD causing minimal stenosis (0-24%) 4. CAD-RADS 1. Minimal non-obstructive CAD (0-24%). Consider non-atherosclerotic causes of chest pain. Consider preventive therapy and risk factor modification. 5.  Etiology for cardiomyopathy is nonischemic. __________   2D echo 11/26/2019: 1. Left ventricular ejection fraction, by estimation, is 55 to 60%. The  left ventricle has normal function. The left ventricle has no regional  wall motion abnormalities. Left ventricular diastolic parameters are  consistent with Grade II diastolic  dysfunction (pseudonormalization).   2. Right ventricular systolic function is normal. The right ventricular  size is normal.   3. Left atrial size was severely dilated.   4. The mitral valve is normal in structure. Mild mitral valve  regurgitation.   5. The aortic valve is tricuspid. Aortic valve regurgitation is not  visualized. Mild  aortic valve sclerosis is present, with no evidence of  aortic valve stenosis.  __________   Limited echo 01/30/2019: 1. The left ventricle has low normal systolic function, with an ejection fraction of 50-55%. The cavity size was normal. Left ventricular diastolic Doppler parameters are consistent with impaired relaxation.  2. The right ventricle has normal systolc function. The cavity was normal. There is no increase in right ventricular wall thickness. Unable to estimate RVSP  3. Mitral valve regurgitation is mild to moderate by color flow Doppler. __________   TEE 11/2018: 1. The left ventricle has mild-moderately reduced systolic function, with an ejection fraction of 40-45%. The cavity size was normal. Left ventricular diastolic function could not be evaluated.  2. The right ventricle has normal systolc function. The cavity was normal. There is no increase in right ventricular wall thickness.  3. Left atrial size was mildly dilated.  4. The mitral valve is grossly normal.  5. The tricuspid valve was grossly normal.  6. No intracardiac thrombi or masses were visualized.  7. The patient developed uncontrolled cough few minutes after the probe was inserted due to suspected laryngeal spasm. Thus, a complete study was not performed but I was able to determine that there was no evidence of thrombus. __________   2D echo 11/2018:  1. The left ventricle has moderately reduced systolic function, with an ejection fraction of 35-40%. The cavity size was normal. Left ventricular diastolic function could not be evaluated secondary to atrial fibrillation. Left ventricular diffuse  hypokinesis.  2. The right ventricle has normal systolic function. The cavity was normal. There is mildly increased right ventricular wall thickness.  3. Left atrial size was mild-moderately dilated.  4. Right atrial size was mildly dilated.  5. The mitral valve was not well visualized. Mitral valve regurgitation is moderate  by color flow Doppler.  6. The tricuspid valve is not well visualized. Tricuspid valve regurgitation is mild-moderate.  7. The aortic valve is tricuspid. Mild thickening of the aortic valve.  8. The aortic root is normal in size and structure.  9. The inferior vena cava was normal in size with <50% respiratory variability.   EKG:  EKG is ordered today.  The EKG ordered today demonstrates NSR, 62 bpm, anterolateral TWI  Recent Labs: 11/09/2022: ALT 12; Hemoglobin 13.8; Platelets 213.0; Pro B Natriuretic peptide (BNP) 30.0 12/12/2022: BUN 19; Creatinine, Ser 0.86; Potassium 4.7; Sodium 139  Recent Lipid Panel    Component Value Date/Time   CHOL 239 (H) 04/12/2021 1434   TRIG 136.0 04/12/2021 1434   HDL 51.40 04/12/2021 1434   CHOLHDL 5 04/12/2021 1434   VLDL 27.2 04/12/2021 1434   LDLCALC 160 (H) 04/12/2021 1434   LDLDIRECT 175.5 03/19/2013 0930    PHYSICAL EXAM:    VS:  BP 132/60 (BP Location: Left Arm, Patient Position: Sitting, Cuff Size: Normal)   Pulse 62   Ht 5' (1.524 m)   Wt 149 lb (67.6 kg)   SpO2 96%   BMI 29.10 kg/m   BMI: Body mass index is 29.1 kg/m.  Physical Exam Vitals reviewed.  Constitutional:      Appearance: She is well-developed.  HENT:     Head: Normocephalic and atraumatic.  Eyes:     General:        Right eye: No discharge.        Left eye: No discharge.  Neck:     Vascular: No JVD.  Cardiovascular:     Rate and Rhythm: Normal rate and regular rhythm.     Heart sounds: Normal heart  sounds, S1 normal and S2 normal. Heart sounds not distant. No midsystolic click and no opening snap. No murmur heard.    No friction rub.  Pulmonary:     Effort: Pulmonary effort is normal. No respiratory distress.     Breath sounds: Normal breath sounds. No decreased breath sounds, wheezing or rales.  Chest:     Chest wall: No tenderness.  Abdominal:     General: There is no distension.  Musculoskeletal:     Cervical back: Normal range of motion.     Right  lower leg: Edema present.     Left lower leg: Edema present.     Comments: Mild to 1+ bilateral lower extremity pedal/ankle edema, right greater than left.  Skin:    General: Skin is warm and dry.     Nails: There is no clubbing.  Neurological:     Mental Status: She is alert and oriented to person, place, and time.  Psychiatric:        Speech: Speech normal.        Behavior: Behavior normal.        Thought Content: Thought content normal.        Judgment: Judgment normal.     Wt Readings from Last 3 Encounters:  12/19/22 149 lb (67.6 kg)  11/28/22 150 lb 12.8 oz (68.4 kg)  11/09/22 149 lb 12.8 oz (67.9 kg)     ASSESSMENT & PLAN:   Lower extremity swelling with exertional fatigue: Symptoms of lower extremity swelling appear to be consistent with dependent edema likely exacerbated by diltiazem use.  However, with a component of exertional fatigue and history of NICM, cannot exclude recurrence of cardiomyopathy.  Recent BNP normal.  She does not appear grossly volume up on exam with a weight that is down 1 pound when compared to her visit in 07/2022, at which time she was euvolemic and well compensated.  Discontinue diltiazem.  Continue leg elevation.  For now, she may continue furosemide 20 mg daily with recent BMP showing stable renal function and electrolytes.  Obtain echo to evaluate for new cardiomyopathy.  Remains on OAC as outlined below.  Persistent A-fib/flutter: Maintaining sinus rhythm.  Transition from diltiazem to carvedilol 6.25 mg twice daily given lower extremity swelling (previously intolerant to metoprolol secondary to bradycardia and fatigue).  CHA2DS2-VASc at least 6.  She remains on apixaban 5 mg twice daily and does not meet reduced dosing criteria.  Recent labs stable.  No falls or symptoms concerning for bleeding.  Nonobstructive CAD: No symptoms of angina.  Continue aggressive risk factor modification and primary prevention including apixaban in place of aspirin  given history of persistent A-fib/flutter, rosuvastatin, and losartan.  Based on echo findings, may need to consider repeat ischemic evaluation.  HFrEF secondary to NICM: Euvolemic and well compensated.  With noted exertional fatigue and lower extremity swelling we will pursue echo to evaluate for recurrence of cardiomyopathy.  Transition from diltiazem to carvedilol as outlined above.  Continue losartan and low-dose furosemide.  Further recommendations regarding GDMT and cardiac testing pending echo results.  HTN: Blood pressure is well-controlled in the office today.  Transition from diltiazem to carvedilol as outlined above with continuation of losartan.  Low-sodium diet encouraged.  HLD: LDL 160 in 03/2021.  Target LDL less than 70.  She remains on rosuvastatin 10 mg twice weekly.  Followed by PCP.   Disposition: F/u with Dr. Okey Dupre or an APP after echo, and EP as directed.   Medication Adjustments/Labs and Tests  Ordered: Current medicines are reviewed at length with the patient today.  Concerns regarding medicines are outlined above. Medication changes, Labs and Tests ordered today are summarized above and listed in the Patient Instructions accessible in Encounters.   Signed, Eula Listen, PA-C 12/19/2022 11:33 AM     [facility] [place] [address] [place], [place] [address] [phone]

## 2022-12-19 ENCOUNTER — Ambulatory Visit: Payer: Medicare Other | Attending: Physician Assistant | Admitting: Physician Assistant

## 2022-12-19 ENCOUNTER — Encounter: Payer: Self-pay | Admitting: Physician Assistant

## 2022-12-19 VITALS — BP 132/60 | HR 62 | Ht 60.0 in | Wt 149.0 lb

## 2022-12-19 DIAGNOSIS — I428 Other cardiomyopathies: Secondary | ICD-10-CM

## 2022-12-19 DIAGNOSIS — R5383 Other fatigue: Secondary | ICD-10-CM

## 2022-12-19 DIAGNOSIS — I4819 Other persistent atrial fibrillation: Secondary | ICD-10-CM

## 2022-12-19 DIAGNOSIS — M7989 Other specified soft tissue disorders: Secondary | ICD-10-CM

## 2022-12-19 DIAGNOSIS — I10 Essential (primary) hypertension: Secondary | ICD-10-CM

## 2022-12-19 DIAGNOSIS — I2510 Atherosclerotic heart disease of native coronary artery without angina pectoris: Secondary | ICD-10-CM | POA: Diagnosis not present

## 2022-12-19 DIAGNOSIS — E785 Hyperlipidemia, unspecified: Secondary | ICD-10-CM

## 2022-12-19 MED ORDER — CARVEDILOL 6.25 MG PO TABS: 6.2500 mg | ORAL_TABLET | Freq: Two times a day (BID) | ORAL | 3 refills | Status: DC

## 2022-12-19 NOTE — Patient Instructions (Signed)
Medication Instructions:  STOP Diltazem  START Carvedilol 6.25 mg twice daily   *If you need a refill on your cardiac medications before your next appointment, please call your pharmacy*   Testing/Procedures:  Echocardiogram - Your physician has requested that you have an echocardiogram. Echocardiography is a painless test that uses sound waves to create images of your heart. It provides your doctor with information about the size and shape of your heart and how well your heart's chambers and valves are working. This procedure takes approximately one hour. There are no restrictions for this procedure.     Follow-Up: At [facility], you and your health needs are our priority.  As part of our continuing mission to provide you with exceptional heart care, we have created designated Provider Care Teams.  These Care Teams include your primary Cardiologist (physician) and Advanced Practice Providers (APPs -  Physician Assistants and Nurse Practitioners) who all work together to provide you with the care you need, when you need it.  We recommend signing up for the patient portal called "MyChart".  Sign up information is provided on this After Visit Summary.  MyChart is used to connect with patients for Virtual Visits (Telemedicine).  Patients are able to view lab/test results, encounter notes, upcoming appointments, etc.  Non-urgent messages can be sent to your provider as well.   To learn more about what you can do with MyChart, go to [email or web address].    Your next appointment:   1 month(s)  Provider:   Eula Listen, PA-C

## 2022-12-21 DIAGNOSIS — G4733 Obstructive sleep apnea (adult) (pediatric): Secondary | ICD-10-CM | POA: Diagnosis not present

## 2022-12-26 DIAGNOSIS — Z87891 Personal history of nicotine dependence: Secondary | ICD-10-CM | POA: Diagnosis not present

## 2022-12-26 DIAGNOSIS — D6869 Other thrombophilia: Secondary | ICD-10-CM | POA: Diagnosis not present

## 2023-01-01 ENCOUNTER — Other Ambulatory Visit: Payer: Self-pay | Admitting: Cardiology

## 2023-01-01 ENCOUNTER — Other Ambulatory Visit: Payer: Self-pay | Admitting: Internal Medicine

## 2023-01-01 DIAGNOSIS — I4819 Other persistent atrial fibrillation: Secondary | ICD-10-CM

## 2023-01-01 NOTE — Telephone Encounter (Signed)
Prescription refill request for Eliquis received. Indication: Afib  Last office visit: 12/19/22 Shea Evans)  Scr: 0.86 (12/12/22)  Age: 77 Weight: 67.6kg   Appropriate dose. Refill sent.

## 2023-01-01 NOTE — Telephone Encounter (Signed)
Refill request

## 2023-01-09 ENCOUNTER — Ambulatory Visit: Payer: Medicare Other | Attending: Physician Assistant

## 2023-01-09 DIAGNOSIS — I2510 Atherosclerotic heart disease of native coronary artery without angina pectoris: Secondary | ICD-10-CM | POA: Diagnosis not present

## 2023-01-09 DIAGNOSIS — I4819 Other persistent atrial fibrillation: Secondary | ICD-10-CM

## 2023-01-09 LAB — ECHOCARDIOGRAM COMPLETE
Area-P 1/2: 3.21 cm2
S' Lateral: 3 cm

## 2023-01-09 MED ORDER — PERFLUTREN LIPID MICROSPHERE
1.0000 mL | INTRAVENOUS | Status: AC | PRN
Start: 2023-01-09 — End: 2023-01-09
  Administered 2023-01-09: 2 mL via INTRAVENOUS

## 2023-01-20 ENCOUNTER — Other Ambulatory Visit: Payer: Self-pay | Admitting: Internal Medicine

## 2023-01-21 DIAGNOSIS — G4733 Obstructive sleep apnea (adult) (pediatric): Secondary | ICD-10-CM | POA: Diagnosis not present

## 2023-01-28 NOTE — Progress Notes (Unsigned)
Cardiology Office Note    Date:  01/29/2023   ID:  Ravyn, Tweten 08/03/1945, MRN [id number]  PCP:  Sherlene Shams, MD  Cardiologist:  Yvonne Kendall, MD  Electrophysiologist:  Lanier Prude, MD   Chief Complaint: Follow up  History of Present Illness:   Melissa Bonilla is a 77 y.o. female with history of nonobstructive CAD, persistent A-fib and flutter on apixaban status post A-fib/flutter ablation in 03/2020 with recurrence, HFrEF with subsequent normalization of LV systolic function, diastolic dysfunction, HTN, HLD, osteoporosis with compression fracture, OSA on CPAP, and depression who presents for follow up of echo.   She was diagnosed with A-fib in 11/2018.  Echo at that time showed an EF of 35 to 40%, diffuse hypokinesis, normal RV systolic function, mildly to moderately dilated left atrium, mildly dilated right atrium, moderate mitral regurgitation, mild to moderate tricuspid regurgitation, tricuspid aortic valve, and normal size/structure aortic root.  Following diuresis, she underwent TEE guided DCCV in 11/2018 with TEE showing an EF of 40 to 45%.  Following procedure, she was noted to have frequent PVCs and what seemed to be a run of atrial flutter with 2:1 AV block.  Cardioversion did not last as she was noted to be back in A-fib with RVR the following day.  Upon presentation for repeat DCCV in 12/2018, she was noted to have spontaneously converted to sinus rhythm.  Echo in 01/2019, in sinus rhythm, showed an EF of 50 to 55%, diastolic dysfunction, normal RV systolic function and ventricular cavity size, and mild to moderate mitral regurgitation.  Echo from 11/2019 demonstrated an EF of 55 to 60%, no regional wall motion abnormalities, grade 2 diastolic dysfunction, normal RV systolic function and ventricular cavity size, severely dilated left atrium, mild mitral regurgitation, and aortic valve sclerosis without evidence of stenosis.  Coronary CTA in 12/2019 showed a calcium score  of 33.6 which was the 50th percentile with 0 to 24% proximal LAD stenosis noted.  She has been followed closely by EP in the A-fib clinic for her A-fib/flutter and underwent A-fib ablation in 03/2020.   She was found to be in atrial flutter with RVR in 09/2021 at PCPs office and A-fib clinic.  She subsequently underwent DCCV on 09/30/2021.  Following this, due to recurrent palpitations she underwent Zio patch monitoring in 11/2021 which showed a 38% burden of A-fib/flutter.  Given this, she was scheduled for a repeat A-fib ablation on 03/27/2022.  However, this had to be canceled secondary to the patient having suffered a compression fracture.  She was seen in the office in 05/2022 and was without symptoms of angina or decompensation.  She had not had any further palpitations consistent with prior A-fib/flutter.  She did note she was under significant stress surrounding her back pain when she underwent prior outpatient cardiac monitoring.  Given improvement in symptoms, she wondered if her A-fib burden was as significant.  In this setting, she underwent repeat outpatient cardiac monitoring with Zio patch in 05/2022 showing a predominant rhythm of sinus with an average rate of 66 bpm (range 48 to 106 bpm in sinus), single episode of NSVT occurred lasting 8 beats, 169 episodes of SVT lasting up to 11 beats, PAF/flutter was noted with an overall burden of less than 1% with the longest episode lasting 2 hours and 23 minutes.  Rare PACs and PVCs.  Patient triggered events corresponded to sinus rhythm and A-fib/flutter with RVR.     She was evaluated at her  primary care office in 10/2022 for lower extremity swelling that was more progressive throughout the day.  BNP 30.  She was last seen in our office on 12/19/2022 noting a couple month history of increasing lower extremity swelling that was more progressive throughout the day with ambulation and with having her feet hanging down.  Right lower extremity swelling was greater  than left in the setting of a prior right ankle fracture.  No progressive orthopnea.  Her weight was down 1 pound by our scale when compared to revisit in 07/2022.  Diltiazem was discontinued and she was placed on carvedilol.  Echo on 01/09/2023 showed an EF of 55-60%, no regional wall motion abnormalities, grade II diastolic dysfunction, normal RV systolic function and ventricular cavity size, moderately dilated left atrium, mild to moderate mitral regurgitation, and an estimated right atrial pressure of 3 mmHg.   She comes in doing well from a cardiac perspective and is without symptoms of angina or cardiac decompensation.  Lower extremity swelling has significantly improved following discontinuation of diltiazem.  She does not continue to note some intermittent swelling, though this is not as bad as what she was previously experiencing.  She remains on furosemide 20 mg daily.  Her weight is down 5 pounds today when compared to her last clinic visit.  No progressive orthopnea or early satiety.  No falls or symptoms concerning for bleeding.  She does struggle with CPAP mask at times.   Labs independently reviewed: 11/2022 - potassium 4.7, BUN 19, serum creatinine 0.86 10/2022 - albumin 4.1, AST/ALT normal, BNP 30, Hgb 13.8, PLT 213 03/2021 - TC 239, TG 136, HDL 51, LDL 160 04/2020 - TSH normal  Past Medical History:  Diagnosis Date   Arrhythmia    atrial fibrillation   CHF (congestive heart failure) (HCC)    Depression    Hyperlipidemia    Hypertension    Menopausal syndrome (hot flashes) 1992   used HRT fo 2 yrs    Obstructive sleep apnea    Persistent atrial fibrillation (HCC)    Shingles rash 09/20/2021    Past Surgical History:  Procedure Laterality Date   APPENDECTOMY  1972   ATRIAL FIBRILLATION ABLATION N/A 04/09/2020   Procedure: ATRIAL FIBRILLATION ABLATION;  Surgeon: Lanier Prude, MD;  Location: [facility];  Service: Cardiovascular;  Laterality: N/A;   CARDIOVERSION  N/A 11/28/2018   Procedure: CARDIOVERSION;  Surgeon: Iran Ouch, MD;  Location: [facility] ORS;  Service: Cardiovascular;  Laterality: N/A;   CARDIOVERSION N/A 12/23/2018   Procedure: CARDIOVERSION;  Surgeon: Iran Ouch, MD;  Location: [facility] ORS;  Service: Cardiovascular;  Laterality: N/A;   CARDIOVERSION N/A 09/30/2019   Procedure: CARDIOVERSION;  Surgeon: Yvonne Kendall, MD;  Location: [facility] ORS;  Service: Cardiovascular;  Laterality: N/A;   CARDIOVERSION N/A 09/30/2021   Procedure: CARDIOVERSION;  Surgeon: Quintella Reichert, MD;  Location: [facility] ENDOSCOPY;  Service: Cardiovascular;  Laterality: N/A;   CESAREAN SECTION     [id number], 1972   TEE WITHOUT CARDIOVERSION N/A 11/28/2018   Procedure: TRANSESOPHAGEAL ECHOCARDIOGRAM (TEE);  Surgeon: Iran Ouch, MD;  Location: [facility] ORS;  Service: Cardiovascular;  Laterality: N/A;    Current Medications: Current Meds  Medication Sig   acetaminophen (TYLENOL) 650 MG CR tablet Take 1,300 mg by mouth every 8 (eight) hours as needed for pain.   ascorbic acid (VITAMIN C) 500 MG tablet Take 500 mg by mouth daily.   carvedilol (COREG) 6.25 MG tablet Take 1 tablet (6.25 mg total)  by mouth 2 (two) times daily.   cholecalciferol (VITAMIN D) 25 MCG (1000 UNIT) tablet Take 1,000 Units by mouth daily.   Coenzyme Q10 (COQ10) 200 MG CAPS Take 1 capsule by mouth daily.   ELIQUIS 5 MG TABS tablet TAKE 1 TABLET(5 MG) BY MOUTH TWICE DAILY   losartan (COZAAR) 25 MG tablet TAKE 1/2 TABLET(12.5 MG) BY MOUTH DAILY   Magnesium 400 MG CAPS Take 1 capsule by mouth daily at 2 am.   Multiple Vitamin (MULTIVITAMIN WITH MINERALS) TABS tablet Take 1 tablet by mouth daily.   rosuvastatin (CRESTOR) 10 MG tablet Take 1 tablet (10 mg total) by mouth 2 (two) times a week.   vitamin B-12 (CYANOCOBALAMIN) 1000 MCG tablet Take 1,000 mcg by mouth daily.   [DISCONTINUED] furosemide (LASIX) 20 MG tablet Take 1 tablet (20 mg total) by mouth daily.    Allergies:   Codeine, Morphine,  and Sulfa antibiotics   Social History   Socioeconomic History   Marital status: Married    Spouse name: Not on file   Number of children: Not on file   Years of education: Not on file   Highest education level: Not on file  Occupational History    Employer: self    Comment: Owner of a cleaning Business  Tobacco Use   Smoking status: Former    Current packs/day: 0.00    Average packs/day: 0.3 packs/day for 15.0 years (3.8 ttl pk-yrs)    Types: Cigarettes    Start date: 108    Quit date: 73    Years since quitting: 32.6   Smokeless tobacco: Never   Tobacco comments:    Former smoker 09/22/21  Vaping Use   Vaping status: Never Used  Substance and Sexual Activity   Alcohol use: Not Currently    Comment: 1/2 glass of wine on occassion   Drug use: No   Sexual activity: Not on file  Other Topics Concern   Not on file  Social History Narrative   Not on file   Social Determinants of Health   Financial Resource Strain: Low Risk  (12/16/2021)   Overall Financial Resource Strain (CARDIA)    Difficulty of Paying Living Expenses: Not hard at all  Food Insecurity: No Food Insecurity (12/16/2021)   Hunger Vital Sign    Worried About Running Out of Food in the Last Year: Never true    Ran Out of Food in the Last Year: Never true  Transportation Needs: No Transportation Needs (12/16/2021)   PRAPARE - Administrator, Civil Service (Medical): No    Lack of Transportation (Non-Medical): No  Physical Activity: Not on file  Stress: No Stress Concern Present (12/16/2021)   [employer] of Occupational Health - Occupational Stress Questionnaire    Feeling of Stress : Not at all  Social Connections: Unknown (12/16/2021)   Social Connection and Isolation Panel [NHANES]    Frequency of Communication with Friends and Family: Not on file    Frequency of Social Gatherings with Friends and Family: Not on file    Attends Religious Services: Not on file    Active Member of  Clubs or Organizations: Not on file    Attends Banker Meetings: Not on file    Marital Status: Married     Family History:  The patient's family history includes Arthritis in her maternal grandmother and mother; Breast cancer (age of onset: 78) in her paternal aunt; Breast cancer (age of onset: 44) in her paternal aunt;  Heart disease in her mother; Heart disease (age of onset: 73) in her father; Hypertension in her maternal grandmother and mother; Stroke in her maternal grandmother.  ROS:   12-point review of systems is negative unless otherwise noted in the HPI.   EKGs/Labs/Other Studies Reviewed:    Studies reviewed were summarized above. The additional studies were reviewed today:  2D echo 01/09/2023: 1. Left ventricular ejection fraction, by estimation, is 55 to 60%. The  left ventricle has normal function. The left ventricle has no regional  wall motion abnormalities. Left ventricular diastolic parameters are  consistent with Grade II diastolic  dysfunction (pseudonormalization).   2. Right ventricular systolic function is normal. The right ventricular  size is normal. Tricuspid regurgitation signal is inadequate for assessing  PA pressure.   3. Left atrial size was moderately dilated.   4. The mitral valve is normal in structure. Mild to moderate mitral valve  regurgitation. No evidence of mitral stenosis.   5. The aortic valve is tricuspid. Aortic valve regurgitation is not  visualized. No aortic stenosis is present.   6. The inferior vena cava is normal in size with greater than 50%  respiratory variability, suggesting right atrial pressure of 3 mmHg.  __________  Melissa Bonilla patch 05/2022:   The patient was monitored for 14 days.   The predominant rhythm was sinus with an average rate of 66 bpm (48-106 bpm in sinus).   There were rare PAC's and PVC's.   A single episode of nonsustained ventricular tachycardia occurred, lasting 8 beats with a maximum rate of 190  bpm.   There were 169 supraventricular runs, lasting up to 11 beats with a maximum rate of 176 bpm.   Paroxysmal atrial fibrillation/flutter was observed (burden <1%).  The average ventricular rate was 124 bpm (range 84-164 bpm).  The longest episode lasted 2 hours, 23 minutes.   Patient triggered events correspond to sinus rhythm and atrial fibrillation/flutter with rapid ventricular response.   Predominantly sinus rhythm with paroxysmal atrial fibrillation/flutter, as detailed above (atrial fibrillation/flutter burden <1%).  Multiple episodes of PSVT were observed, as well as rare PAC's and PVC's. __________   Melissa Bonilla patch 11/2021: HR 54 - 199 bpm, average 89 bpm. 1 NSVT lasting 9 beats. 38% burden of AF/AFL with rates 67-199 bpm, average 122 bpm. 2 pauses, longest 3.6 seconds. Symptom triggered episode corresponds to AF. Occasional supraventricular ectopy, 1.2%. Rare ventricular ectopy. __________   A-fib ablation 04/09/2020: CONCLUSIONS: 1. Successful PVI 2. Successful ablation/isolation of the posterior wall 3. Intracardiac echo reveals normal left ventricular function, dilated left atrium 4. No early apparent complications. 5. Continue amiodarone for now. Will discuss stopping this at the 30-month follow-up appointment based on rhythm over the next 3 months.  __________   Coronary CTA 12/25/2019: Aorta: Normal size. Aortic root and descending aortic wall calcifications noted. No dissection.   Aortic Valve:  Trileaflet.  No calcifications.   Coronary Arteries:  Normal coronary origin.  Right dominance.   RCA is a large dominant artery that gives rise to PDA and PLA. There is no plaque.   Left main is a large artery that gives rise to LAD and LCX arteries.   LAD is a large vessel that has mild calcified plaque in the proximal segment causing minimal stenosis (0-24%).   LCX is a non-dominant artery that gives rise to two obtuse marginal branches. There is no plaque.   Other  findings:   Normal pulmonary vein drainage into the left atrium.   Normal  left atrial appendage without a thrombus.   Normal size of the pulmonary artery.   IMPRESSION: 1. Low coronary calcium score of 33.6. This was 50th percentile for age and sex matched control. 2. Normal coronary origin with right dominance. 3. Mild calcified plaque in the proximal LAD causing minimal stenosis (0-24%) 4. CAD-RADS 1. Minimal non-obstructive CAD (0-24%). Consider non-atherosclerotic causes of chest pain. Consider preventive therapy and risk factor modification. 5.  Etiology for cardiomyopathy is nonischemic. __________   2D echo 11/26/2019: 1. Left ventricular ejection fraction, by estimation, is 55 to 60%. The  left ventricle has normal function. The left ventricle has no regional  wall motion abnormalities. Left ventricular diastolic parameters are  consistent with Grade II diastolic  dysfunction (pseudonormalization).   2. Right ventricular systolic function is normal. The right ventricular  size is normal.   3. Left atrial size was severely dilated.   4. The mitral valve is normal in structure. Mild mitral valve  regurgitation.   5. The aortic valve is tricuspid. Aortic valve regurgitation is not  visualized. Mild aortic valve sclerosis is present, with no evidence of  aortic valve stenosis.  __________   Limited echo 01/30/2019: 1. The left ventricle has low normal systolic function, with an ejection fraction of 50-55%. The cavity size was normal. Left ventricular diastolic Doppler parameters are consistent with impaired relaxation.  2. The right ventricle has normal systolc function. The cavity was normal. There is no increase in right ventricular wall thickness. Unable to estimate RVSP  3. Mitral valve regurgitation is mild to moderate by color flow Doppler. __________   TEE 11/2018: 1. The left ventricle has mild-moderately reduced systolic function, with an ejection fraction of  40-45%. The cavity size was normal. Left ventricular diastolic function could not be evaluated.  2. The right ventricle has normal systolc function. The cavity was normal. There is no increase in right ventricular wall thickness.  3. Left atrial size was mildly dilated.  4. The mitral valve is grossly normal.  5. The tricuspid valve was grossly normal.  6. No intracardiac thrombi or masses were visualized.  7. The patient developed uncontrolled cough few minutes after the probe was inserted due to suspected laryngeal spasm. Thus, a complete study was not performed but I was able to determine that there was no evidence of thrombus. __________   2D echo 11/2018:  1. The left ventricle has moderately reduced systolic function, with an ejection fraction of 35-40%. The cavity size was normal. Left ventricular diastolic function could not be evaluated secondary to atrial fibrillation. Left ventricular diffuse  hypokinesis.  2. The right ventricle has normal systolic function. The cavity was normal. There is mildly increased right ventricular wall thickness.  3. Left atrial size was mild-moderately dilated.  4. Right atrial size was mildly dilated.  5. The mitral valve was not well visualized. Mitral valve regurgitation is moderate by color flow Doppler.  6. The tricuspid valve is not well visualized. Tricuspid valve regurgitation is mild-moderate.  7. The aortic valve is tricuspid. Mild thickening of the aortic valve.  8. The aortic root is normal in size and structure.  9. The inferior vena cava was normal in size with <50% respiratory variability.   EKG:  EKG is ordered today.  The EKG ordered today demonstrates sinus bradycardia, 56 bpm, anterolateral T wave inversion consistent with prior tracings  Recent Labs: 11/09/2022: ALT 12; Hemoglobin 13.8; Platelets 213.0; Pro B Natriuretic peptide (BNP) 30.0 12/12/2022: BUN 19; Creatinine, Ser 0.86;  Potassium 4.7; Sodium 139  Recent Lipid Panel     Component Value Date/Time   CHOL 239 (H) 04/12/2021 1434   TRIG 136.0 04/12/2021 1434   HDL 51.40 04/12/2021 1434   CHOLHDL 5 04/12/2021 1434   VLDL 27.2 04/12/2021 1434   LDLCALC 160 (H) 04/12/2021 1434   LDLDIRECT 175.5 03/19/2013 0930    PHYSICAL EXAM:    VS:  BP 136/70 (BP Location: Left Arm, Patient Position: Sitting, Cuff Size: Normal)   Pulse (!) 56   Ht 5' (1.524 m)   Wt 144 lb 9.6 oz (65.6 kg)   SpO2 97%   BMI 28.24 kg/m   BMI: Body mass index is 28.24 kg/m.  Physical Exam Vitals reviewed.  Constitutional:      Appearance: She is well-developed.  HENT:     Head: Normocephalic and atraumatic.  Eyes:     General:        Right eye: No discharge.        Left eye: No discharge.  Neck:     Vascular: No JVD.  Cardiovascular:     Rate and Rhythm: Normal rate and regular rhythm.     Heart sounds: Normal heart sounds, S1 normal and S2 normal. Heart sounds not distant. No midsystolic click and no opening snap. No murmur heard.    No friction rub.  Pulmonary:     Effort: Pulmonary effort is normal. No respiratory distress.     Breath sounds: Normal breath sounds. No decreased breath sounds, wheezing or rales.  Chest:     Chest wall: No tenderness.  Abdominal:     General: There is no distension.  Musculoskeletal:     Cervical back: Normal range of motion.     Right lower leg: No edema.     Left lower leg: No edema.  Skin:    General: Skin is warm and dry.     Nails: There is no clubbing.  Neurological:     Mental Status: She is alert and oriented to person, place, and time.  Psychiatric:        Speech: Speech normal.        Behavior: Behavior normal.        Thought Content: Thought content normal.        Judgment: Judgment normal.     Wt Readings from Last 3 Encounters:  01/29/23 144 lb 9.6 oz (65.6 kg)  12/19/22 149 lb (67.6 kg)  11/28/22 150 lb 12.8 oz (68.4 kg)     ASSESSMENT & PLAN:   Lower extremity swelling: Lower extremity swelling has  improved following discontinuation of calcium channel blocker.  She remains on low-dose furosemide 20 mg daily with recent renal function stable.    Persistent Afib/flutter: Maintaining sinus rhythm with a mildly bradycardic rate, asymptomatic.  She remains on carvedilol 6.25 mg twice daily.  CHA2DS2-VASc at least 6.  She remains on apixaban 5 mg twice daily and does not meet reduced dosing criteria.  No falls or symptoms concerning for bleeding.  Recent labs stable.  Nonobstructive CAD: No symptoms suggestive of angina.  Continue aggressive risk factor modification and primary prevention including apixaban in place of aspirin given history of persistent A-fib/flutter, rosuvastatin, and losartan.  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  Most recent echo showed stable preserved LV systolic function.  She remains on carvedilol, losartan, and furosemide.  Recent labs stable.  HTN: Blood pressure is reasonably controlled in the office today.  Continue medical therapy as outlined above.  HLD: LDL 160 in 03/2021.  Target LDL < 70.  She remains on rosuvastatin 10 mg 2 times weekly with noted intolerance to higher doses.  Followed by PCP.  Pulmonary nodule: Followed by pulmonology.  OSA: Continued CPAP adherence is encouraged.  Briefly discussed Inspire device, she would prefer to avoid implantation.   Disposition: F/u with Dr. Okey Dupre or an APP in 6 months, and EP as directed.    Medication Adjustments/Labs and Tests Ordered: Current medicines are reviewed at length with the patient today.  Concerns regarding medicines are outlined above. Medication changes, Labs and Tests ordered today are summarized above and listed in the Patient Instructions accessible in Encounters.   Signed, Eula Listen, PA-C 01/29/2023 10:46 AM     [facility] [place] [address] [place], [place] [address] [phone]

## 2023-01-29 ENCOUNTER — Encounter: Payer: Self-pay | Admitting: Physician Assistant

## 2023-01-29 ENCOUNTER — Ambulatory Visit: Payer: Medicare Other | Attending: Physician Assistant | Admitting: Physician Assistant

## 2023-01-29 VITALS — BP 136/70 | HR 56 | Ht 60.0 in | Wt 144.6 lb

## 2023-01-29 DIAGNOSIS — I428 Other cardiomyopathies: Secondary | ICD-10-CM | POA: Diagnosis not present

## 2023-01-29 DIAGNOSIS — R911 Solitary pulmonary nodule: Secondary | ICD-10-CM

## 2023-01-29 DIAGNOSIS — I4819 Other persistent atrial fibrillation: Secondary | ICD-10-CM

## 2023-01-29 DIAGNOSIS — I10 Essential (primary) hypertension: Secondary | ICD-10-CM

## 2023-01-29 DIAGNOSIS — G4733 Obstructive sleep apnea (adult) (pediatric): Secondary | ICD-10-CM

## 2023-01-29 DIAGNOSIS — M7989 Other specified soft tissue disorders: Secondary | ICD-10-CM | POA: Diagnosis not present

## 2023-01-29 DIAGNOSIS — I5032 Chronic diastolic (congestive) heart failure: Secondary | ICD-10-CM

## 2023-01-29 DIAGNOSIS — I2510 Atherosclerotic heart disease of native coronary artery without angina pectoris: Secondary | ICD-10-CM | POA: Diagnosis not present

## 2023-01-29 DIAGNOSIS — E785 Hyperlipidemia, unspecified: Secondary | ICD-10-CM

## 2023-01-29 MED ORDER — FUROSEMIDE 20 MG PO TABS
20.0000 mg | ORAL_TABLET | Freq: Every day | ORAL | 3 refills | Status: DC
Start: 2023-01-29 — End: 2024-02-19

## 2023-01-29 NOTE — Patient Instructions (Signed)
Medication Instructions:  Your physician recommends that you continue on your current medications as directed. Please refer to the Current Medication list given to you today.  *If you need a refill on your cardiac medications before your next appointment, please call your pharmacy*   Lab Work: NONE If you have labs (blood work) drawn today and your tests are completely normal, you will receive your results only by: MyChart Message (if you have MyChart) OR A paper copy in the mail If you have any lab test that is abnormal or we need to change your treatment, we will call you to review the results.   Testing/Procedures: NONE    Follow-Up: At [facility], you and your health needs are our priority.  As part of our continuing mission to provide you with exceptional heart care, we have created designated Provider Care Teams.  These Care Teams include your primary Cardiologist (physician) and Advanced Practice Providers (APPs -  Physician Assistants and Nurse Practitioners) who all work together to provide you with the care you need, when you need it.  We recommend signing up for the patient portal called "MyChart".  Sign up information is provided on this After Visit Summary.  MyChart is used to connect with patients for Virtual Visits (Telemedicine).  Patients are able to view lab/test results, encounter notes, upcoming appointments, etc.  Non-urgent messages can be sent to your provider as well.   To learn more about what you can do with MyChart, go to [email or web address].    Your next appointment:   6 month(s)  Provider:   You may see Yvonne Kendall, MD or one of the following Advanced Practice Providers on your designated Care Team:   Eula Listen, [place]

## 2023-01-31 DIAGNOSIS — G4733 Obstructive sleep apnea (adult) (pediatric): Secondary | ICD-10-CM | POA: Diagnosis not present

## 2023-01-31 IMAGING — CR DG THORACIC SPINE 2V
3 series · 3 of 3 positions shown · non-contrast
Comparison: Chest CTA 11/25/2018.  Cardiac CT 12/25/2019.

CLINICAL DATA: Back pain for 1 week. Worsening pain after falling
today.

EXAM:
THORACIC SPINE 2 VIEWS

[t-spine ap]
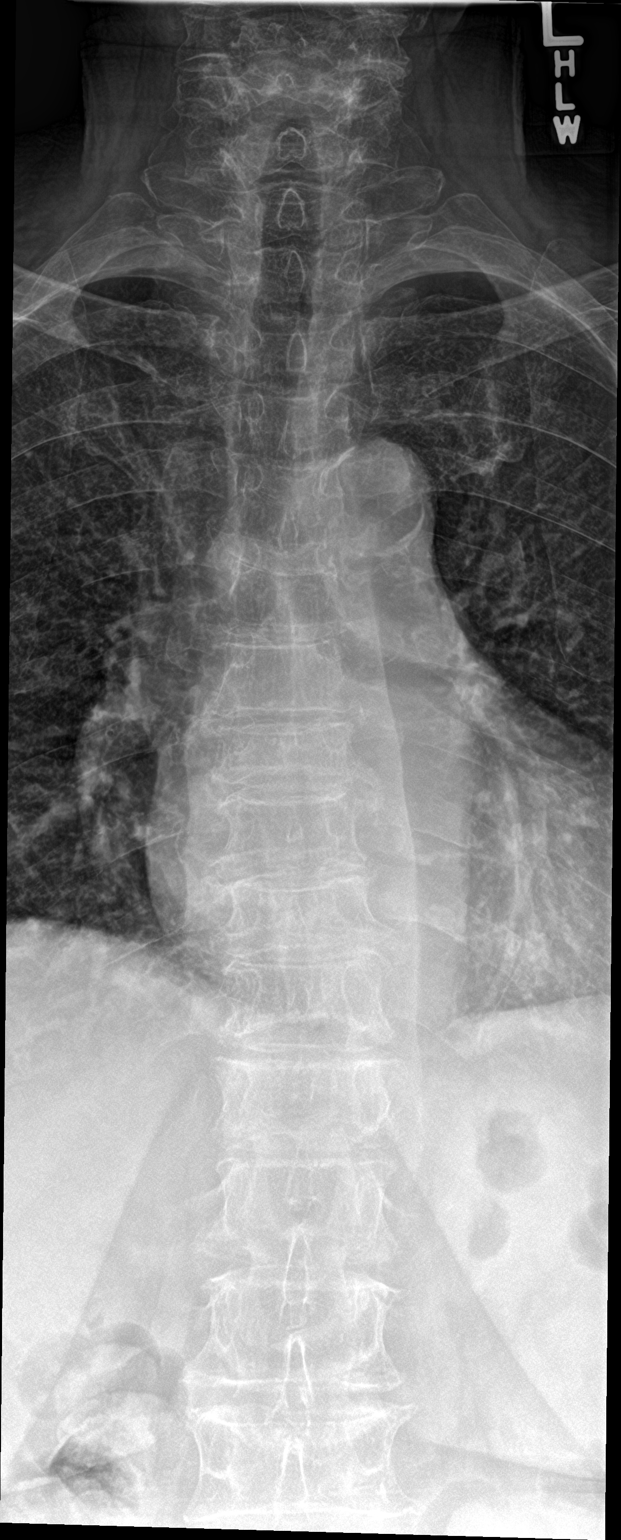

[t-spine lat]
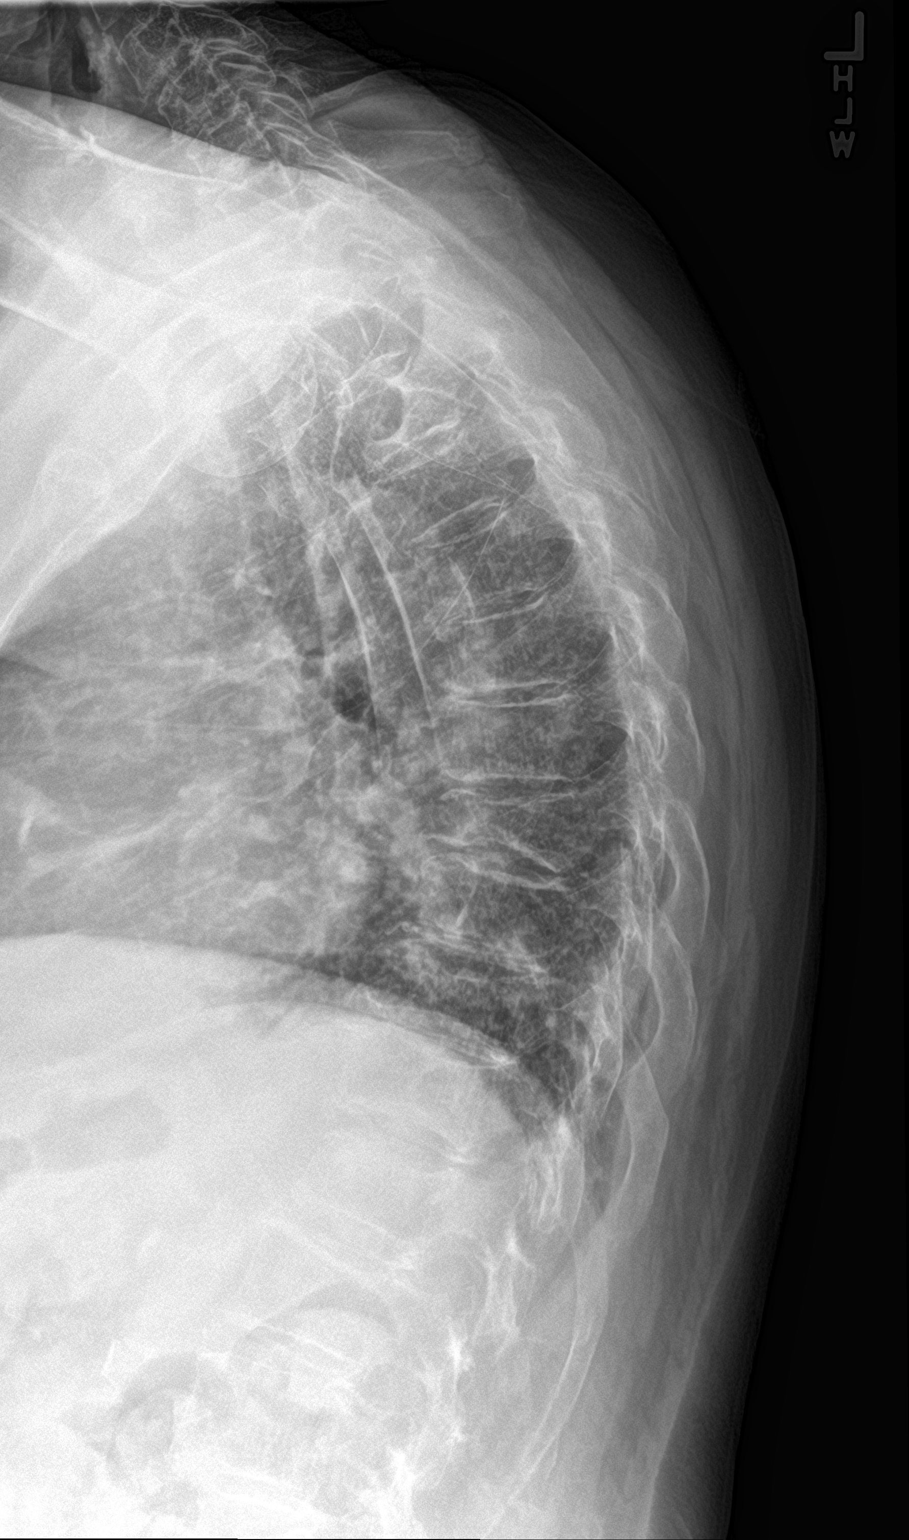

[t-spine swimmers]
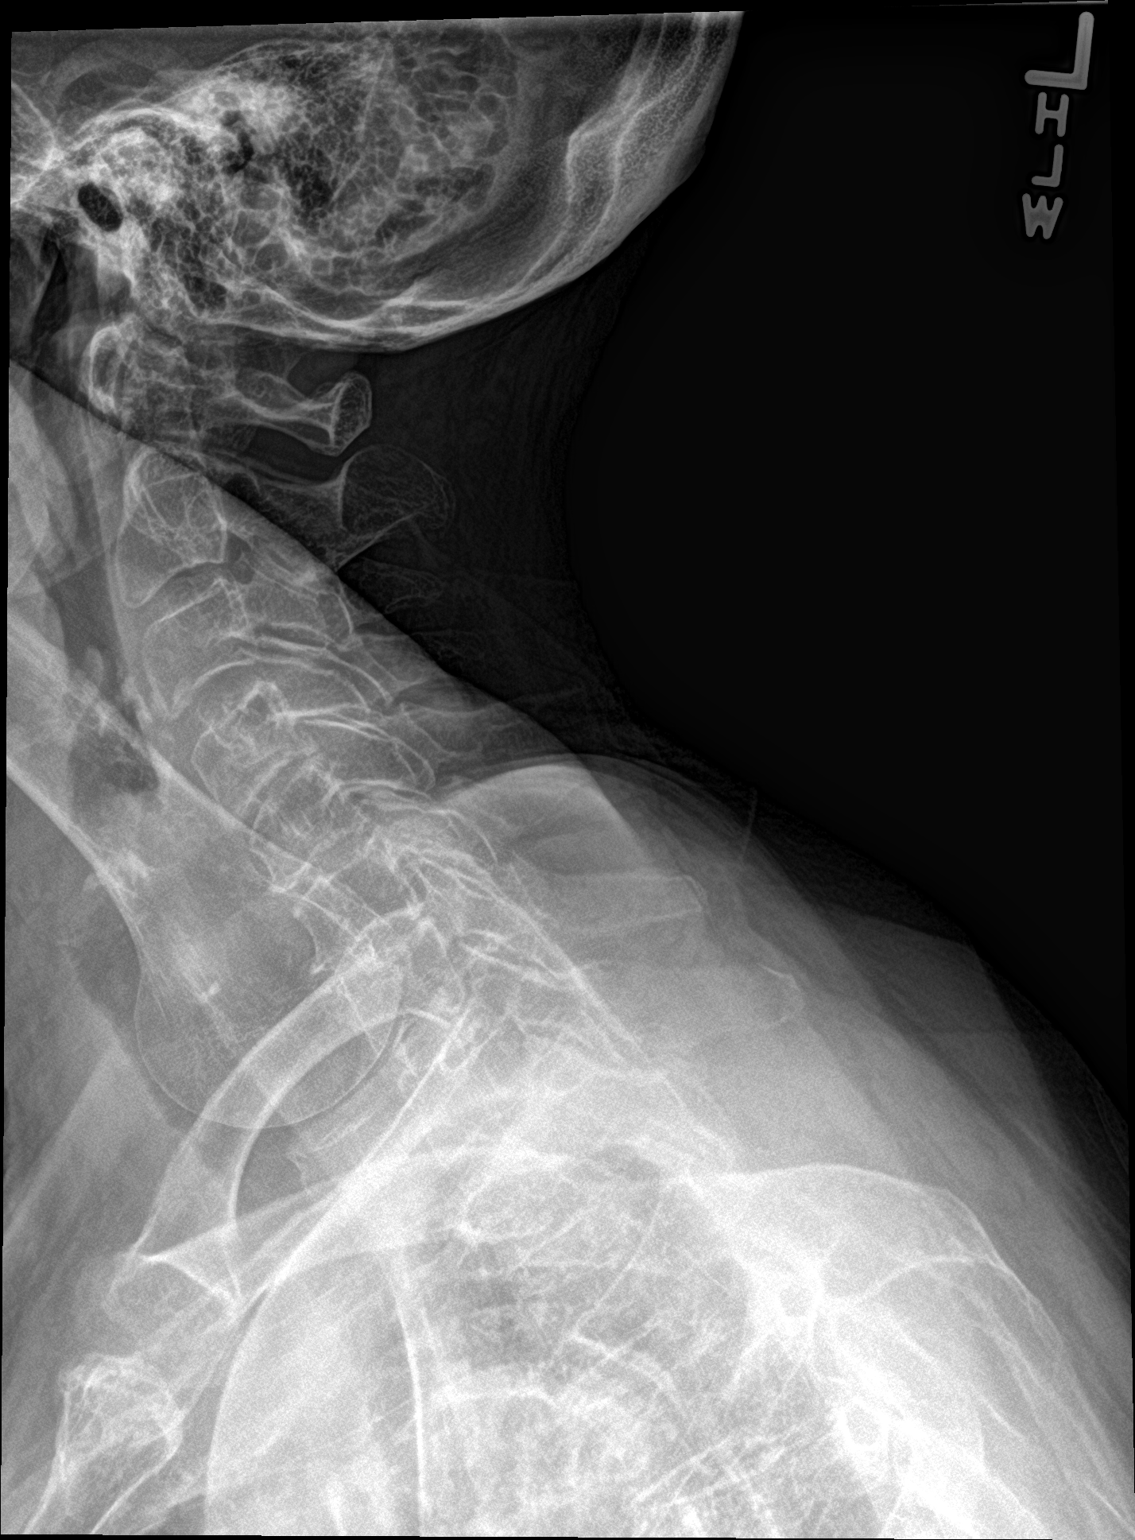

[3 of 3 positions shown; findings below may reference images not displayed]

FINDINGS: There are small ribs at T12 and 12 rib-bearing thoracic type
vertebral bodies. The bones are diffusely demineralized. There is a
new biconcave compression fracture at T8 resulting in approximately
40% loss of vertebral body height. In addition, there are possible
mild superior endplate compression fractures at T10, T11 and T12. No
widening of the interpedicular distance.
IMPRESSION: Several lower thoracic compression deformities appear new compared
with available prior studies and could be acute/subacute.

## 2023-01-31 IMAGING — CR DG LUMBAR SPINE 2-3V
3 series · 3 of 3 positions shown · non-contrast
Comparison: Lumbar MRI 08/03/2021 and abdominopelvic CT 07/20/2021.

CLINICAL DATA: Back pain for 1 week. Worsening pain after falling
today.

EXAM:
LUMBAR SPINE - 2-3 VIEW

[l-spine ap]
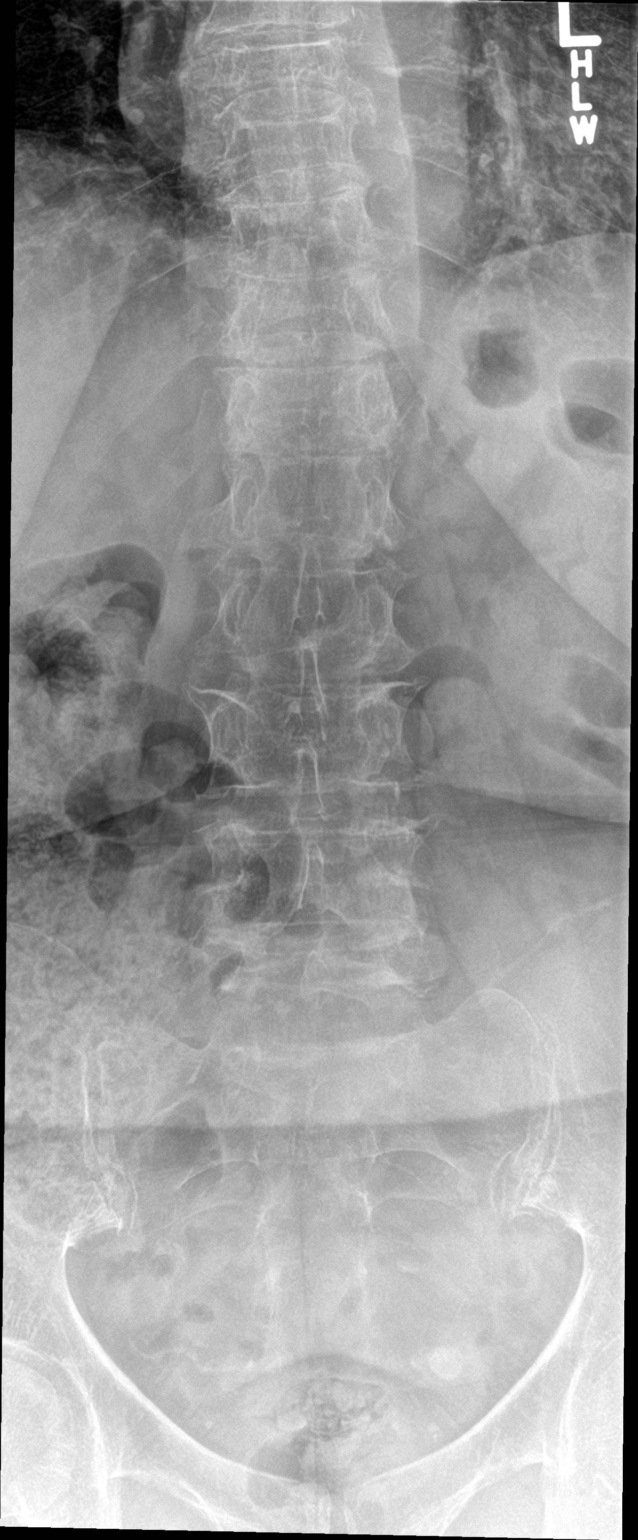

[l-spine lat]
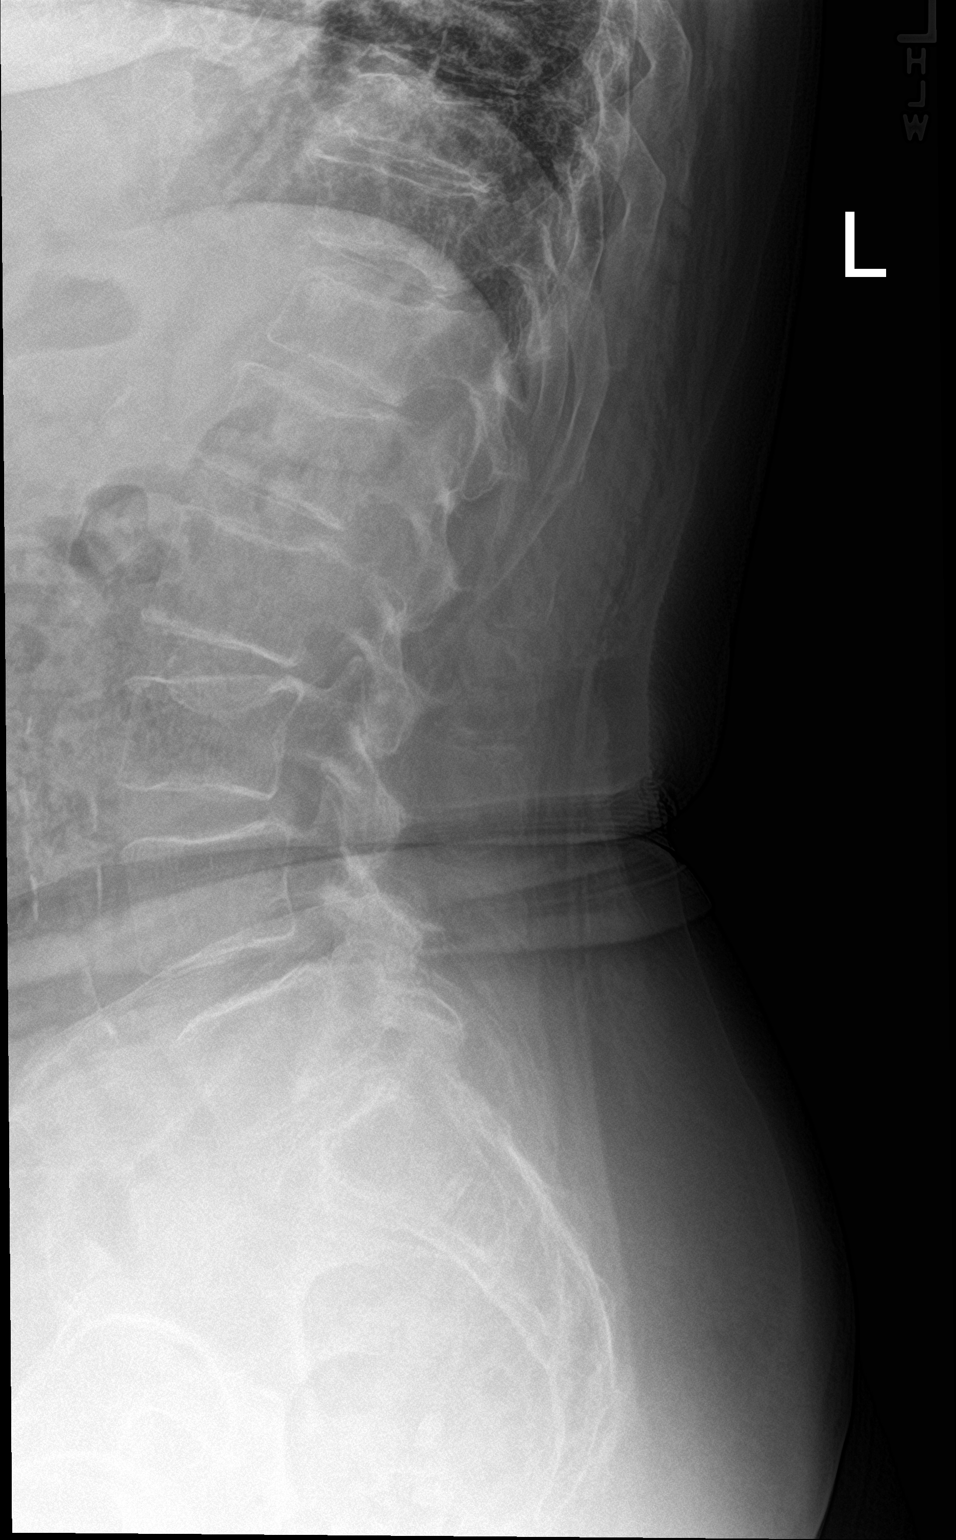

[l-spine spot]
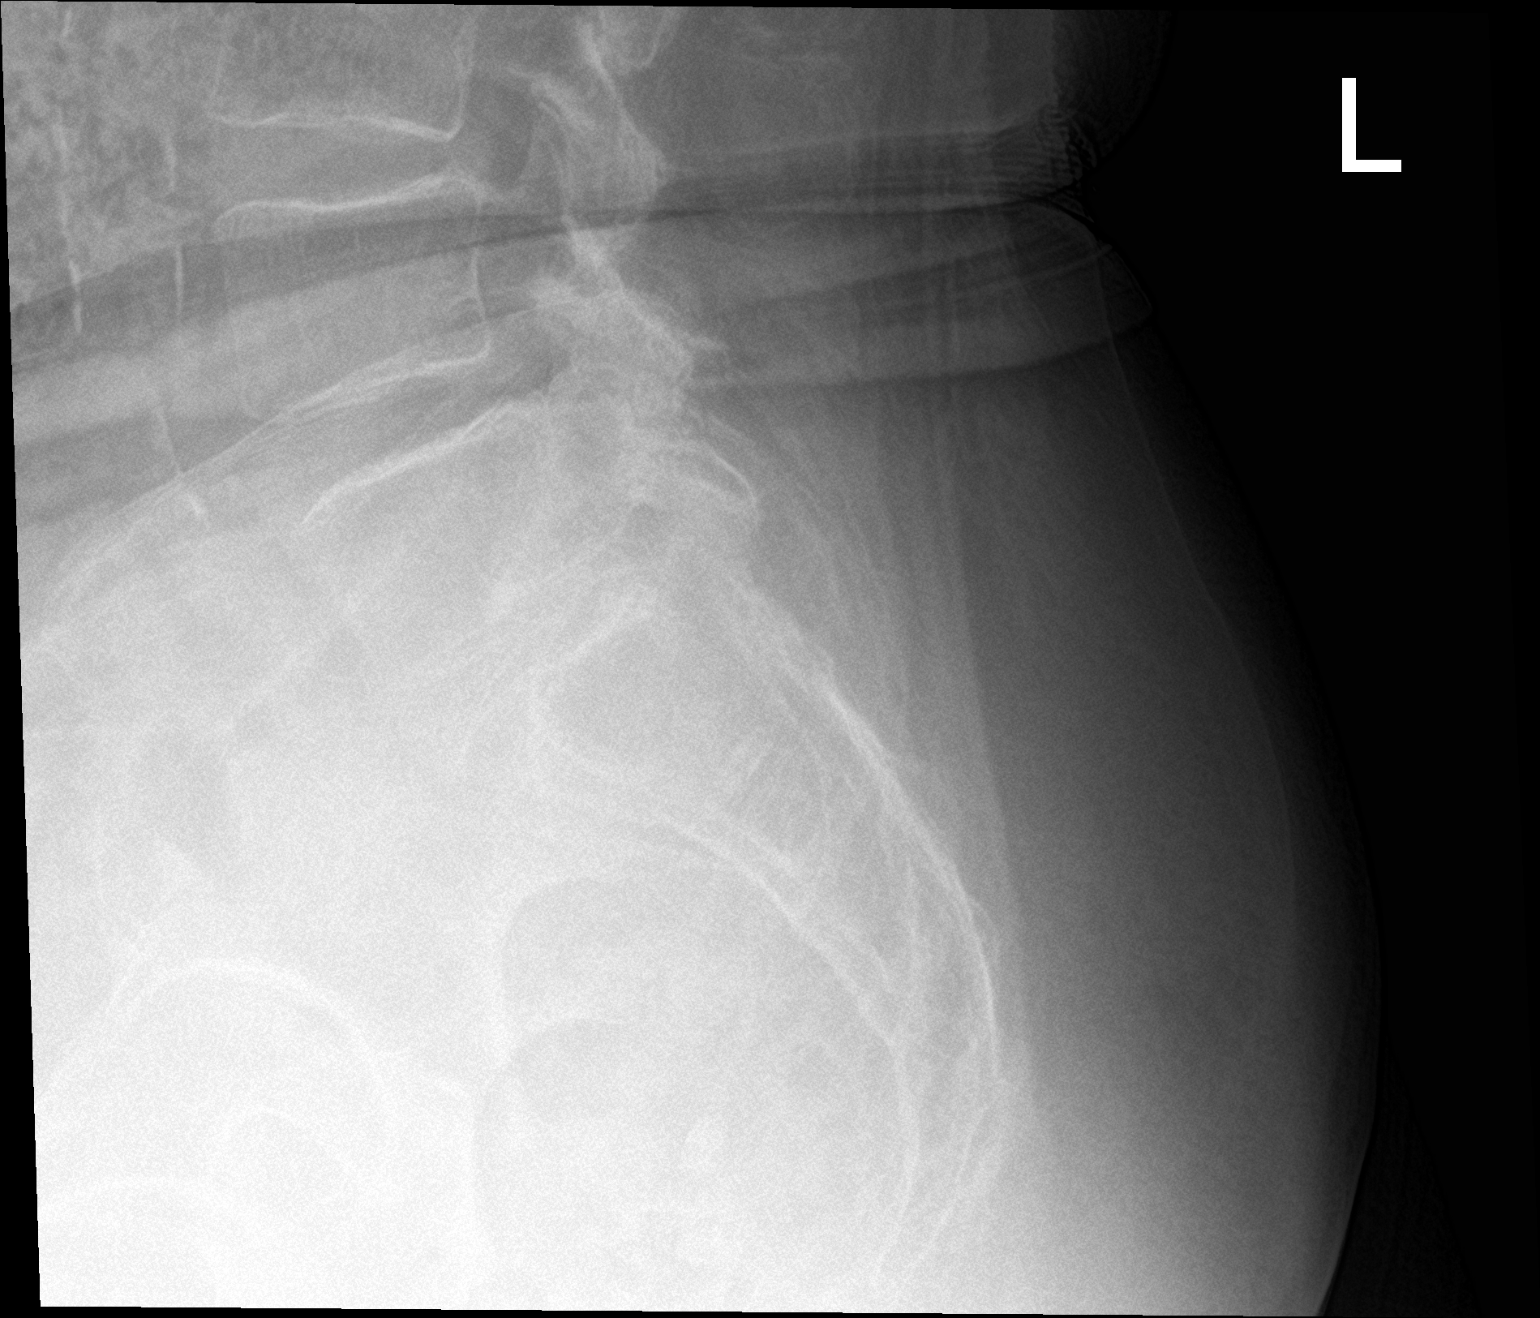

[3 of 3 positions shown; findings below may reference images not displayed]

FINDINGS: There are 5 lumbar type vertebral bodies. Previously demonstrated
superior endplate compression fracture at L3 appears stable. No
acute lumbar spine fractures are identified. There are mild
compression deformities within the lower thoracic spine which are
further described on separate examination of the thoracic spine. The
bones are diffusely demineralized. The alignment is normal. Aortic
atherosclerosis noted.
IMPRESSION: Stable healing superior endplate compression fracture at L3. No
acute lumbar spine fractures identified. See separate thoracic spine
report.

## 2023-03-13 ENCOUNTER — Ambulatory Visit (INDEPENDENT_AMBULATORY_CARE_PROVIDER_SITE_OTHER): Payer: Medicare Other | Admitting: *Deleted

## 2023-03-13 VITALS — Ht 60.0 in | Wt 144.0 lb

## 2023-03-13 DIAGNOSIS — Z0000 Encounter for general adult medical examination without abnormal findings: Secondary | ICD-10-CM | POA: Diagnosis not present

## 2023-03-13 NOTE — Progress Notes (Signed)
Subjective:   Melissa Bonilla is a 77 y.o. female who presents for Medicare Annual (Subsequent) preventive examination.  Visit Complete Virtual I connected with Melissa Bonilla on 03/13/23 by a audio enabled telemedicine application and verified that I am speaking with the correct person using two identifiers.  Patient Location : Home  Provider Location Home Office  I discussed the limitations of evaluation and management by telemedicine  The patient expressed understanding and agreed to proceed.  Vital Signs: Unable to obtain new vitals due to this being a telehealth visit.    Cardiac Risk Factors include: advanced age (>74men, >27 women);dyslipidemia;hypertension     Objective:    Today's Vitals   03/13/23 1244  Weight: 144 lb (65.3 kg)  Height: 5' (1.524 m)   Body mass index is 28.12 kg/m.     03/13/2023   12:59 PM 12/16/2021    1:05 PM 11/30/2021    8:32 AM 09/30/2021    9:03 AM 09/20/2021    4:47 PM 04/09/2020    5:57 AM 09/30/2019    7:41 AM  Advanced Directives  Does Patient Have a Medical Advance Directive? Yes Yes No Yes Yes No No  Type of Estate agent of [place];Living will Living will  Living will Living will    Does patient want to make changes to medical advance directive?  No - Patient declined       Copy of Healthcare Power of Attorney in Chart? No - copy requested        Would patient like information on creating a medical advance directive?      No - Patient declined No - Patient declined    Current Medications (verified) Outpatient Encounter Medications as of 03/13/2023  Medication Sig   acetaminophen (TYLENOL) 650 MG CR tablet Take 1,300 mg by mouth every 8 (eight) hours as needed for pain.   ascorbic acid (VITAMIN C) 500 MG tablet Take 500 mg by mouth daily.   carvedilol (COREG) 6.25 MG tablet Take 1 tablet (6.25 mg total) by mouth 2 (two) times daily.   cholecalciferol (VITAMIN D) 25 MCG (1000 UNIT) tablet Take 1,000 Units by  mouth daily.   Coenzyme Q10 (COQ10) 200 MG CAPS Take 1 capsule by mouth daily.   ELIQUIS 5 MG TABS tablet TAKE 1 TABLET(5 MG) BY MOUTH TWICE DAILY   furosemide (LASIX) 20 MG tablet Take 1 tablet (20 mg total) by mouth daily.   losartan (COZAAR) 25 MG tablet TAKE 1/2 TABLET(12.5 MG) BY MOUTH DAILY   Multiple Vitamin (MULTIVITAMIN WITH MINERALS) TABS tablet Take 1 tablet by mouth daily.   rosuvastatin (CRESTOR) 10 MG tablet Take 1 tablet (10 mg total) by mouth 2 (two) times a week.   vitamin B-12 (CYANOCOBALAMIN) 1000 MCG tablet Take 1,000 mcg by mouth daily.   Magnesium 400 MG CAPS Take 1 capsule by mouth daily at 2 am. (Patient not taking: Reported on 03/13/2023)   No facility-administered encounter medications on file as of 03/13/2023.    Allergies (verified) Codeine, Morphine, and Sulfa antibiotics   History: Past Medical History:  Diagnosis Date   Arrhythmia    atrial fibrillation   CHF (congestive heart failure) (HCC)    Depression    Hyperlipidemia    Hypertension    Menopausal syndrome (hot flashes) 1992   used HRT fo 2 yrs    Obstructive sleep apnea    Persistent atrial fibrillation (HCC)    Shingles rash 09/20/2021   Past Surgical History:  Procedure  Laterality Date   APPENDECTOMY  1972   ATRIAL FIBRILLATION ABLATION N/A 04/09/2020   Procedure: ATRIAL FIBRILLATION ABLATION;  Surgeon: Lanier Prude, MD;  Location: [facility];  Service: Cardiovascular;  Laterality: N/A;   CARDIOVERSION N/A 11/28/2018   Procedure: CARDIOVERSION;  Surgeon: Iran Ouch, MD;  Location: [facility];  Service: Cardiovascular;  Laterality: N/A;   CARDIOVERSION N/A 12/23/2018   Procedure: CARDIOVERSION;  Surgeon: Iran Ouch, MD;  Location: [facility];  Service: Cardiovascular;  Laterality: N/A;   CARDIOVERSION N/A 09/30/2019   Procedure: CARDIOVERSION;  Surgeon: Yvonne Kendall, MD;  Location: [facility] OR[facility];  Service: Cardiovascular;  Laterality: N/A;   CARDIOVERSION N/A 09/30/2021    Procedure: CARDIOVERSION;  Surgeon: Quintella Reichert, MD;  Location: [facility];  Service: Cardiovascular;  Laterality: N/A;   CESAREAN SECTION     [id number], 1972   TEE WITHOUT CARDIOVERSION N/A 11/28/2018   Procedure: TRANSESOPHAGEAL ECHOCARDIOGRAM (TEE);  Surgeon: Iran Ouch, MD;  Location: [facility] OR[facility];  Service: Cardiovascular;  Laterality: N/A;   Family History  Problem Relation Age of Onset   Arthritis Mother        Rheumatoid   Heart disease Mother        Valvular   Hypertension Mother    Heart disease Father 27       CABG   Arthritis Maternal Grandmother        Rheumatoid   Hypertension Maternal Grandmother    Stroke Maternal Grandmother    Breast cancer Paternal Aunt 12   Breast cancer Paternal Aunt 28   Social History   Socioeconomic History   Marital status: Married    Spouse name: Not on file   Number of children: Not on file   Years of education: Not on file   Highest education level: Not on file  Occupational History    Employer: self    Comment: Owner of a cleaning Business  Tobacco Use   Smoking status: Former    Current packs/day: 0.00    Average packs/day: 0.3 packs/day for 15.0 years (3.8 ttl pk-yrs)    Types: Cigarettes    Start date: 64    Quit date: 73    Years since quitting: 32.7   Smokeless tobacco: Never   Tobacco comments:    Former smoker 09/22/21  Vaping Use   Vaping status: Never Used  Substance and Sexual Activity   Alcohol use: Not Currently    Comment: 1/2 glass of wine on occassion   Drug use: No   Sexual activity: Not on file  Other Topics Concern   Not on file  Social History Narrative   Married   Social Determinants of Health   Financial Resource Strain: Low Risk  (03/13/2023)   Overall Financial Resource Strain (CARDIA)    Difficulty of Paying Living Expenses: Not hard at all  Food Insecurity: No Food Insecurity (03/13/2023)   Hunger Vital Sign    Worried About Running Out of Food in the Last Year: Never true     Ran Out of Food in the Last Year: Never true  Transportation Needs: No Transportation Needs (03/13/2023)   PRAPARE - Administrator, Civil Service (Medical): No    Lack of Transportation (Non-Medical): No  Physical Activity: Insufficiently Active (03/13/2023)   Exercise Vital Sign    Days of Exercise per Week: 5 days    Minutes of Exercise per Session: 20 min  Stress: No Stress Concern Present (03/13/2023)   [employer]  of Occupational Health - Occupational Stress Questionnaire    Feeling of Stress : Only a little  Social Connections: Moderately Integrated (03/13/2023)   Social Connection and Isolation Panel [NHANES]    Frequency of Communication with Friends and Family: More than three times a week    Frequency of Social Gatherings with Friends and Family: More than three times a week    Attends Religious Services: More than 4 times per year    Active Member of [employer] or Organizations: No    Attends Banker Meetings: Never    Marital Status: Married    Tobacco Counseling Counseling given: Not Answered Tobacco comments: Former smoker 09/22/21   Clinical Intake:  Pre-visit preparation completed: Yes  Pain : No/denies pain     BMI - recorded: 28.12 Nutritional Status: BMI 25 -29 Overweight Nutritional Risks: None Diabetes: No  How often do you need to have someone help you when you read instructions, pamphlets, or other written materials from your doctor or pharmacy?: 1 - Never  Interpreter Needed?: No  Information entered by :: R. Ladale Sherburn LPN   Activities of Daily Living    03/13/2023   12:46 PM  In your present state of health, do you have any difficulty performing the following activities:  Hearing? 0  Vision? 0  Comment glasses  Difficulty concentrating or making decisions? 0  Walking or climbing stairs? 0  Dressing or bathing? 0  Doing errands, shopping? 0  Preparing Food and eating ? N  Using the Toilet? N  In the past six  months, have you accidently leaked urine? Y  Comment just once with coughing spell  Do you have problems with loss of bowel control? N  Managing your Medications? N  Managing your Finances? N  Housekeeping or managing your Housekeeping? N    Patient Care Team: Sherlene Shams, MD as PCP - General (Internal Medicine) End, Cristal Deer, MD as PCP - Cardiology (Cardiology) Lanier Prude, MD as PCP - Electrophysiology (Cardiology) Iran Ouch, MD as Consulting Physician (Cardiology)  Indicate any recent Medical Services you may have received from other than [facility] providers in the past year (date may be approximate).     Assessment:   This is a routine wellness examination for [facility].  Hearing/Vision screen Hearing Screening - Comments:: No issues Vision Screening - Comments:: glasses   Goals Addressed             This Visit's Progress    Patient Stated       Wants to do chair yoga       Depression Screen    03/13/2023   12:52 PM 03/13/2022   11:28 AM 12/16/2021    1:09 PM 12/07/2021    1:12 PM 07/20/2021    3:41 PM 04/12/2021    1:57 PM 04/09/2019    1:17 PM  PHQ 2/9 Scores  PHQ - 2 Score 0 0 0 0 2 0 0  PHQ- 9 Score 0    4      Fall Risk    03/13/2023   12:48 PM 03/13/2022   11:28 AM 12/07/2021    1:12 PM 09/20/2021    3:33 PM 07/20/2021    3:41 PM  Fall Risk   Falls in the past year? 0 1 1 0 0  Number falls in past yr: 0 0 0  0  Injury with Fall? 0 1 0  0  Risk for fall due to : No Fall Risks History of fall(s)  History of fall(s) No Fall Risks No Fall Risks  Follow up Falls prevention discussed;Falls evaluation completed Falls evaluation completed Falls evaluation completed Falls evaluation completed Falls evaluation completed    MEDICARE RISK AT HOME: Medicare Risk at Home Any stairs in or around the home?: No If so, are there any without handrails?: No Home free of loose throw rugs in walkways, pet beds, electrical cords, etc?: Yes Adequate lighting  in your home to reduce risk of falls?: Yes Life alert?: No Use of a cane, walker or w/c?: No Grab bars in the bathroom?: Yes Shower chair or bench in shower?: No Elevated toilet seat or a handicapped toilet?: Yes   Cognitive Function:        03/13/2023   12:59 PM 04/09/2019    1:24 PM  6CIT Screen  What Year? 0 points 0 points  What month? 0 points 0 points  What time? 0 points 0 points  Count back from 20 0 points 0 points  Months in reverse 0 points 0 points  Repeat phrase 2 points 0 points  Total Score 2 points 0 points    Immunizations Immunization History  Administered Date(s) Administered   Influenza Split 05/16/2013   Influenza,inj,Quad PF,6+ Mos 03/05/2015   Influenza-Unspecified 04/19/2012   Moderna Sars-Covid-2 Vaccination 01/08/2020, 04/03/2020   PNEUMOCOCCAL CONJUGATE-20 04/12/2021   Pneumococcal Conjugate-13 01/12/2014   Pneumococcal Polysaccharide-23 03/05/2015   Tdap 12/16/2021   Zoster Recombinant(Shingrix) 10/17/2021, 12/16/2021   Zoster, Live 11/21/2011    TDAP status: Up to date  Flu Vaccine status: Due, Education has been provided regarding the importance of this vaccine. Advised may receive this vaccine at local pharmacy or Health Dept. Aware to provide a copy of the vaccination record if obtained from local pharmacy or Health Dept. Verbalized acceptance and understanding.  Pneumococcal vaccine status: Up to date  Covid-19 vaccine status: Information provided on how to obtain vaccines.   Qualifies for Shingles Vaccine? Yes   Zostavax completed Yes   Shingrix Completed?: Yes  Screening Tests Health Maintenance  Topic Date Due   Medicare Annual Wellness (AWV)  12/17/2022   INFLUENZA VACCINE  01/18/2023   COVID-19 Vaccine (3 - 2023-24 season) 02/18/2023   DTaP/Tdap/Td (2 - Td or Tdap) 12/17/2031   Pneumonia Vaccine 65+ Years old  Completed   DEXA SCAN  Completed   Hepatitis C Screening  Completed   Zoster Vaccines- Shingrix  Completed    HPV VACCINES  Aged Out   Colonoscopy  Discontinued    Health Maintenance  Health Maintenance Due  Topic Date Due   Medicare Annual Wellness (AWV)  12/17/2022   INFLUENZA VACCINE  01/18/2023   COVID-19 Vaccine (3 - 2023-24 season) 02/18/2023    Colorectal cancer screening: No longer required.   Mammogram status: No longer required due to age.  Bone Density status: Completed 04/2021. Results reflect: Bone density results: OSTEOPOROSIS. Repeat every 2 years.  Lung Cancer Screening: (Low Dose CT Chest recommended if Age 34-80 years, 20 pack-year currently smoking OR have quit w/in 15years.) does not qualify.     Additional Screening:  Hepatitis C Screening: does qualify; Completed 03/2015  Vision Screening: Recommended annual ophthalmology exams for early detection of glaucoma and other disorders of the eye. Is the patient up to date with their annual eye exam?  Yes  Who is the provider or what is the name of the office in which the patient attends annual eye exams? [facility] If pt is not established with a provider, would they like  to be referred to a provider to establish care? No .   Dental Screening: Recommended annual dental exams for proper oral hygiene    Community Resource Referral / Chronic Care Management: CRR required this visit?  No   CCM required this visit?  No     Plan:     I have personally reviewed and noted the following in the patient's chart:   Medical and social history Use of alcohol, tobacco or illicit drugs  Current medications and supplements including opioid prescriptions. Patient is not currently taking opioid prescriptions. Functional ability and status Nutritional status Physical activity Advanced directives List of other physicians Hospitalizations, surgeries, and ER visits in previous 12 months Vitals Screenings to include cognitive, depression, and falls Referrals and appointments  In addition, I have reviewed and discussed  with patient certain preventive protocols, quality metrics, and best practice recommendations. A written personalized care plan for preventive services as well as general preventive health recommendations were provided to patient.     Sydell Axon, LPN   09/30/2438   After Visit Summary: (MyChart) Due to this being a telephonic visit, the after visit summary with patients personalized plan was offered to patient via MyChart   Nurse Notes: None

## 2023-03-13 NOTE — Patient Instructions (Signed)
Melissa Bonilla , Thank you for taking time to come for your Medicare Wellness Visit. I appreciate your ongoing commitment to your health goals. Please review the following plan we discussed and let me know if I can assist you in the future.   Referrals/Orders/Follow-Ups/Clinician Recommendations: Get vaccines updated Flu and Covid  This is a list of the screening recommended for you and due dates:  Health Maintenance  Topic Date Due   Flu Shot  01/18/2023   COVID-19 Vaccine (3 - 2023-24 season) 02/18/2023   Medicare Annual Wellness Visit  03/12/2024   DTaP/Tdap/Td vaccine (2 - Td or Tdap) 12/17/2031   Pneumonia Vaccine  Completed   DEXA scan (bone density measurement)  Completed   Hepatitis C Screening  Completed   Zoster (Shingles) Vaccine  Completed   HPV Vaccine  Aged Out   Colon Cancer Screening  Discontinued    Advanced directives: (Copy Requested) Please bring a copy of your health care power of attorney and living will to the office to be added to your chart at your convenience.  Next Medicare Annual Wellness Visit scheduled for next year: Yes 03/17/24 @ 12:45

## 2023-04-07 ENCOUNTER — Other Ambulatory Visit: Payer: Self-pay | Admitting: Internal Medicine

## 2023-04-21 ENCOUNTER — Other Ambulatory Visit: Payer: Self-pay | Admitting: Internal Medicine

## 2023-05-16 ENCOUNTER — Emergency Department: Payer: Medicare Other

## 2023-05-16 ENCOUNTER — Other Ambulatory Visit: Payer: Self-pay

## 2023-05-16 ENCOUNTER — Emergency Department
Admission: EM | Admit: 2023-05-16 | Discharge: 2023-05-16 | Disposition: A | Discharge status: Home / Self Care | Payer: Medicare Other | Attending: Emergency Medicine | Admitting: Emergency Medicine

## 2023-05-16 DIAGNOSIS — M542 Cervicalgia: Secondary | ICD-10-CM | POA: Diagnosis not present

## 2023-05-16 DIAGNOSIS — Y92410 Unspecified street and highway as the place of occurrence of the external cause: Secondary | ICD-10-CM | POA: Diagnosis not present

## 2023-05-16 DIAGNOSIS — Z7901 Long term (current) use of anticoagulants: Secondary | ICD-10-CM | POA: Diagnosis not present

## 2023-05-16 DIAGNOSIS — I4891 Unspecified atrial fibrillation: Secondary | ICD-10-CM | POA: Diagnosis not present

## 2023-05-16 DIAGNOSIS — M546 Pain in thoracic spine: Secondary | ICD-10-CM | POA: Diagnosis not present

## 2023-05-16 DIAGNOSIS — M4802 Spinal stenosis, cervical region: Secondary | ICD-10-CM | POA: Diagnosis not present

## 2023-05-16 DIAGNOSIS — S299XXA Unspecified injury of thorax, initial encounter: Secondary | ICD-10-CM | POA: Diagnosis not present

## 2023-05-16 DIAGNOSIS — R930 Abnormal findings on diagnostic imaging of skull and head, not elsewhere classified: Secondary | ICD-10-CM | POA: Insufficient documentation

## 2023-05-16 DIAGNOSIS — I110 Hypertensive heart disease with heart failure: Secondary | ICD-10-CM | POA: Insufficient documentation

## 2023-05-16 DIAGNOSIS — M549 Dorsalgia, unspecified: Secondary | ICD-10-CM | POA: Diagnosis not present

## 2023-05-16 DIAGNOSIS — R52 Pain, unspecified: Secondary | ICD-10-CM | POA: Diagnosis not present

## 2023-05-16 DIAGNOSIS — R918 Other nonspecific abnormal finding of lung field: Secondary | ICD-10-CM | POA: Diagnosis not present

## 2023-05-16 DIAGNOSIS — I2510 Atherosclerotic heart disease of native coronary artery without angina pectoris: Secondary | ICD-10-CM | POA: Insufficient documentation

## 2023-05-16 DIAGNOSIS — I6523 Occlusion and stenosis of bilateral carotid arteries: Secondary | ICD-10-CM | POA: Diagnosis not present

## 2023-05-16 DIAGNOSIS — I509 Heart failure, unspecified: Secondary | ICD-10-CM | POA: Insufficient documentation

## 2023-05-16 DIAGNOSIS — S0990XA Unspecified injury of head, initial encounter: Secondary | ICD-10-CM | POA: Diagnosis not present
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
## 2023-06-06 ENCOUNTER — Telehealth: Payer: Self-pay

## 2023-06-06 NOTE — Progress Notes (Signed)
Transition Care Management Unsuccessful Follow-up Telephone Call  Date of discharge and from where:  05/16/2023 [facility]  Attempts:  2nd Attempt  Reason for unsuccessful TCM follow-up call:  Left voice message  Jermey Closs [facility]  [facility], [facility] Resource Care Guide Direct Dial: [phone]  Website: [email or web address]

## 2023-06-06 NOTE — Progress Notes (Signed)
Transition Care Management Unsuccessful Follow-up Telephone Call  Date of discharge and from where:  05/16/2023 [facility]  Attempts:  1st Attempt  Reason for unsuccessful TCM follow-up call:  Left voice message  Ashonti Leandro [facility]  [facility], [facility] Resource Care Guide Direct Dial: [phone]  Website: [email or web address]

## 2023-07-22 ENCOUNTER — Other Ambulatory Visit: Payer: Self-pay | Admitting: Internal Medicine

## 2023-08-04 ENCOUNTER — Other Ambulatory Visit: Payer: Self-pay | Admitting: Internal Medicine

## 2023-08-04 DIAGNOSIS — I4819 Other persistent atrial fibrillation: Secondary | ICD-10-CM

## 2023-08-06 NOTE — Telephone Encounter (Signed)
Prescription refill request for Eliquis received. Indication:afib Last office visit:8/24 Scr:0.86  6/24 Age: 78 Weight:65.3  kg  Prescription refilled

## 2023-08-31 DIAGNOSIS — G4733 Obstructive sleep apnea (adult) (pediatric): Secondary | ICD-10-CM | POA: Diagnosis not present

## 2023-10-11 ENCOUNTER — Ambulatory Visit: Admitting: Student

## 2023-10-31 DIAGNOSIS — G4733 Obstructive sleep apnea (adult) (pediatric): Secondary | ICD-10-CM | POA: Diagnosis not present

## 2023-11-09 NOTE — Progress Notes (Unsigned)
 Cardiology Office Note    Date:  11/13/2023   ID:  Melissa, Bonilla 11/23/1945, MRN [id number]  PCP:  Melissa Flax, MD  Cardiologist:  Melissa Crisp, MD  Electrophysiologist:  Melissa Byes, MD   Chief Complaint: Follow-up  History of Present Illness:   Melissa Bonilla is a 78 y.o. female with history of nonobstructive CAD, persistent A-fib and flutter on apixaban  status post A-fib/flutter ablation in 03/2020 with recurrence, HFrEF with subsequent normalization of LV systolic function, diastolic dysfunction, HTN, HLD, osteoporosis with compression fracture, OSA on CPAP, and depression who presents for follow up of CAD, cardiomyopathy, and A-fib.    She was diagnosed with A-fib in 11/2018.  Echo at that time showed an EF of 35 to 40%, diffuse hypokinesis, normal RV systolic function, mildly to moderately dilated left atrium, mildly dilated right atrium, moderate mitral regurgitation, mild to moderate tricuspid regurgitation, tricuspid aortic valve, and normal size/structure aortic root.  Following diuresis, she underwent TEE guided DCCV in 11/2018 with TEE showing an EF of 40 to 45%.  Following procedure, she was noted to have frequent PVCs and what seemed to be a run of atrial flutter with 2:1 AV block.  Cardioversion did not last as she was noted to be back in A-fib with RVR the following day.  Upon presentation for repeat DCCV in 12/2018, she was noted to have spontaneously converted to sinus rhythm.  Echo in 01/2019, in sinus rhythm, showed an EF of 50 to 55%, diastolic dysfunction, normal RV systolic function and ventricular cavity size, and mild to moderate mitral regurgitation.  Echo from 11/2019 demonstrated an EF of 55 to 60%, no regional wall motion abnormalities, grade 2 diastolic dysfunction, normal RV systolic function and ventricular cavity size, severely dilated left atrium, mild mitral regurgitation, and aortic valve sclerosis without evidence of stenosis.  Coronary CTA in  12/2019 showed a calcium  score of 33.6 which was the 50th percentile with 0 to 24% proximal LAD stenosis noted.  She has been followed closely by EP in the A-fib clinic for her A-fib/flutter and underwent A-fib ablation in 03/2020.   She was found to be in atrial flutter with RVR in 09/2021 at PCP's office and A-fib clinic.  She subsequently underwent DCCV on 09/30/2021.  Following this, due to recurrent palpitations she underwent Zio patch monitoring in 11/2021 which showed a 38% burden of A-fib/flutter.  Given this, she was scheduled for a repeat A-fib ablation on 03/27/2022.  However, this had to be canceled secondary to the patient having suffered a compression fracture.  She was seen in the office in 05/2022 and was without symptoms of angina or decompensation.  She had not had any further palpitations consistent with prior A-fib/flutter.  She did note she was under significant stress surrounding her back pain when she underwent prior outpatient cardiac monitoring.  Given improvement in symptoms, she wondered if her A-fib burden was as significant.  In this setting, she underwent repeat outpatient cardiac monitoring with Zio patch in 05/2022 showing a predominant rhythm of sinus with an average rate of 66 bpm (range 48 to 106 bpm in sinus), single episode of NSVT occurred lasting 8 beats, 169 episodes of SVT lasting up to 11 beats, PAF/flutter was noted with an overall burden of less than 1% with the longest episode lasting 2 hours and 23 minutes.  Rare PACs and PVCs.  Patient triggered events corresponded to sinus rhythm and A-fib/flutter with RVR.     She was  evaluated at her primary care office in 10/2022 for lower extremity swelling that was more progressive throughout the day.  BNP 30.  She was seen in our office on 12/19/2022 noting a couple month history of increasing lower extremity swelling that was more progressive throughout the day with ambulation and with having her feet hanging down.  Right lower  extremity swelling was greater than left in the setting of a prior right ankle fracture.  No progressive orthopnea.  Her weight was down 1 pound by our scale when compared to revisit in 07/2022.  Diltiazem  was discontinued and she was placed on carvedilol .  Echo on 01/09/2023 showed an EF of 55-60%, no regional wall motion abnormalities, grade II diastolic dysfunction, normal RV systolic function and ventricular cavity size, moderately dilated left atrium, mild to moderate mitral regurgitation, and an estimated right atrial pressure of 3 mmHg.  She was last seen in our office in 01/2023 noting significant improvement in lower extremity swelling following discontinuation of diltiazem .  Her weight was down 5 pounds when compared to her visit in 12/2022.  No changes in cardiac pharmacotherapy were indicated at that time.  She comes in doing well from a cardiac perspective and is without symptoms of angina or cardiac decompensation.  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  In this setting she has not been getting much sleep and was not using her CPAP.  She has noticed an increase in palpitation burden.  No frank chest pain or dyspnea.  No dizziness, near-syncope, or syncope.  No falls or symptoms concerning for bleeding.  She now has an in-home caretaker to help and the patient will now be sleeping at home with her CPAP.  No significant lower extremity swelling, progressive orthopnea, or weight gain.   Labs independently reviewed: 11/2022 - potassium 4.7, BUN 19, serum creatinine 0.86 10/2022 - albumin 4.1, AST/ALT normal, BNP 30, Hgb 13.8, PLT 213 03/2021 - TC 239, TG 136, HDL 51, LDL 160 04/2020 - TSH normal  Past Medical History:  Diagnosis Date   Arrhythmia    atrial fibrillation   CHF (congestive heart failure) (HCC)    Depression    Hyperlipidemia    Hypertension    Menopausal syndrome (hot flashes)  1992   used HRT fo 2 yrs    Obstructive sleep apnea    Persistent atrial fibrillation (HCC)    Shingles rash 09/20/2021    Past Surgical History:  Procedure Laterality Date   APPENDECTOMY  1972   ATRIAL FIBRILLATION ABLATION N/A 04/09/2020   Procedure: ATRIAL FIBRILLATION ABLATION;  Surgeon: Melissa Byes, MD;  Location: [facility];  Service: Cardiovascular;  Laterality: N/A;   CARDIOVERSION N/A 11/28/2018   Procedure: CARDIOVERSION;  Surgeon: Wenona Hamilton, MD;  Location: [facility] ORS;  Service: Cardiovascular;  Laterality: N/A;   CARDIOVERSION N/A 12/23/2018   Procedure: CARDIOVERSION;  Surgeon: Wenona Hamilton, MD;  Location: [facility] ORS;  Service: Cardiovascular;  Laterality: N/A;   CARDIOVERSION N/A 09/30/2019   Procedure: CARDIOVERSION;  Surgeon: Melissa Crisp, MD;  Location: [facility] ORS;  Service: Cardiovascular;  Laterality: N/A;   CARDIOVERSION N/A 09/30/2021   Procedure: CARDIOVERSION;  Surgeon: Jacqueline Matsu, MD;  Location: [facility] ENDOSCOPY;  Service: Cardiovascular;  Laterality: N/A;   CESAREAN SECTION     [id number], 1972   TEE WITHOUT CARDIOVERSION N/A 11/28/2018   Procedure: TRANSESOPHAGEAL ECHOCARDIOGRAM (TEE);  Surgeon: Antionette Kirks  A, MD;  Location: [facility] ORS;  Service: Cardiovascular;  Laterality: N/A;    Current Medications: Current Meds  Medication Sig   acetaminophen  (TYLENOL ) 650 MG CR tablet Take 1,300 mg by mouth every 8 (eight) hours as needed for pain.   ascorbic acid (VITAMIN C) 500 MG tablet Take 500 mg by mouth daily.   carvedilol  (COREG ) 6.25 MG tablet Take 1 tablet (6.25 mg total) by mouth 2 (two) times daily.   cholecalciferol  (VITAMIN D ) 25 MCG (1000 UNIT) tablet Take 1,000 Units by mouth daily.   ELIQUIS  5 MG TABS tablet TAKE 1 TABLET(5 MG) BY MOUTH TWICE DAILY   furosemide  (LASIX ) 20 MG tablet Take 1 tablet (20 mg total) by mouth daily.   losartan  (COZAAR ) 25 MG tablet TAKE 1/2 TABLET(12.5 MG) BY MOUTH DAILY   Magnesium  400 MG CAPS Take 1  capsule by mouth daily at 2 am.   Multiple Vitamin (MULTIVITAMIN WITH MINERALS) TABS tablet Take 1 tablet by mouth daily.   rosuvastatin  (CRESTOR ) 10 MG tablet TAKE 1 TABLET(10 MG) BY MOUTH 2 TIMES A WEEK   vitamin B-12 (CYANOCOBALAMIN ) 1000 MCG tablet Take 1,000 mcg by mouth daily.   [DISCONTINUED] Coenzyme Q10 (COQ10) 200 MG CAPS Take 1 capsule by mouth daily.    Allergies:   Codeine, Morphine, and Sulfa antibiotics   Social History   Socioeconomic History   Marital status: Married    Spouse name: Not on file   Number of children: Not on file   Years of education: Not on file   Highest education level: Not on file  Occupational History    Employer: self    Comment: Owner of a cleaning Business  Tobacco Use   Smoking status: Former    Current packs/day: 0.00    Average packs/day: 0.3 packs/day for 15.0 years (3.8 ttl pk-yrs)    Types: Cigarettes    Start date: 56    Quit date: 27    Years since quitting: 33.4   Smokeless tobacco: Never   Tobacco comments:    Former smoker 09/22/21  Vaping Use   Vaping status: Never Used  Substance and Sexual Activity   Alcohol use: Not Currently    Comment: 1/2 glass of wine on occassion   Drug use: No   Sexual activity: Not on file  Other Topics Concern   Not on file  Social History Narrative   Married   Social Drivers of Health   Financial Resource Strain: Low Risk  (03/13/2023)   Overall Financial Resource Strain (CARDIA)    Difficulty of Paying Living Expenses: Not hard at all  Food Insecurity: No Food Insecurity (03/13/2023)   Hunger Vital Sign    Worried About Running Out of Food in the Last Year: Never true    Ran Out of Food in the Last Year: Never true  Transportation Needs: No Transportation Needs (03/13/2023)   PRAPARE - Administrator, Civil Service (Medical): No    Lack of Transportation (Non-Medical): No  Physical Activity: Insufficiently Active (03/13/2023)   Exercise Vital Sign    Days of Exercise  per Week: 5 days    Minutes of Exercise per Session: 20 min  Stress: No Stress Concern Present (03/13/2023)   [employer] of Occupational Health - Occupational Stress Questionnaire    Feeling of Stress : Only a little  Social Connections: Moderately Integrated (03/13/2023)   Social Connection and Isolation Panel [NHANES]    Frequency of Communication with Friends and Family: More than  three times a week    Frequency of Social Gatherings with Friends and Family: More than three times a week    Attends Religious Services: More than 4 times per year    Active Member of [employer] or Organizations: No    Attends Engineer, structural: Never    Marital Status: Married     Family History:  The patient's family history includes Arthritis in her maternal grandmother and mother; Breast cancer (age of onset: 19) in her paternal aunt; Breast cancer (age of onset: 16) in her paternal aunt; Heart disease in her mother; Heart disease (age of onset: 84) in her father; Hypertension in her maternal grandmother and mother; Stroke in her maternal grandmother.  ROS:   12-point review of systems is negative unless otherwise noted in the HPI.   EKGs/Labs/Other Studies Reviewed:    Studies reviewed were summarized above. The additional studies were reviewed today:  2D echo 01/09/2023: 1. Left ventricular ejection fraction, by estimation, is 55 to 60%. The  left ventricle has normal function. The left ventricle has no regional  wall motion abnormalities. Left ventricular diastolic parameters are  consistent with Grade II diastolic  dysfunction (pseudonormalization).   2. Right ventricular systolic function is normal. The right ventricular  size is normal. Tricuspid regurgitation signal is inadequate for assessing  PA pressure.   3. Left atrial size was moderately dilated.   4. The mitral valve is normal in structure. Mild to moderate mitral valve  regurgitation. No evidence of mitral stenosis.    5. The aortic valve is tricuspid. Aortic valve regurgitation is not  visualized. No aortic stenosis is present.   6. The inferior vena cava is normal in size with greater than 50%  respiratory variability, suggesting right atrial pressure of 3 mmHg.  __________   Zio patch 05/2022:   The patient was monitored for 14 days.   The predominant rhythm was sinus with an average rate of 66 bpm (48-106 bpm in sinus).   There were rare PAC's and PVC's.   A single episode of nonsustained ventricular tachycardia occurred, lasting 8 beats with a maximum rate of 190 bpm.   There were 169 supraventricular runs, lasting up to 11 beats with a maximum rate of 176 bpm.   Paroxysmal atrial fibrillation/flutter was observed (burden <1%).  The average ventricular rate was 124 bpm (range 84-164 bpm).  The longest episode lasted 2 hours, 23 minutes.   Patient triggered events correspond to sinus rhythm and atrial fibrillation/flutter with rapid ventricular response.   Predominantly sinus rhythm with paroxysmal atrial fibrillation/flutter, as detailed above (atrial fibrillation/flutter burden <1%).  Multiple episodes of PSVT were observed, as well as rare PAC's and PVC's. __________   Zio patch 11/2021: HR 54 - 199 bpm, average 89 bpm. 1 NSVT lasting 9 beats. 38% burden of AF/AFL with rates 67-199 bpm, average 122 bpm. 2 pauses, longest 3.6 seconds. Symptom triggered episode corresponds to AF. Occasional supraventricular ectopy, 1.2%. Rare ventricular ectopy. __________   A-fib ablation 04/09/2020: CONCLUSIONS: 1. Successful PVI 2. Successful ablation/isolation of the posterior wall 3. Intracardiac echo reveals normal left ventricular function, dilated left atrium 4. No early apparent complications. 5. Continue amiodarone  for now. Will discuss stopping this at the 56-month follow-up appointment based on rhythm over the next 3 months.  __________   Coronary CTA 12/25/2019: Aorta: Normal size. Aortic root  and descending aortic wall calcifications noted. No dissection.   Aortic Valve:  Trileaflet.  No calcifications.   Coronary Arteries:  Normal coronary origin.  Right dominance.   RCA is a large dominant artery that gives rise to PDA and PLA. There is no plaque.   Left main is a large artery that gives rise to LAD and LCX arteries.   LAD is a large vessel that has mild calcified plaque in the proximal segment causing minimal stenosis (0-24%).   LCX is a non-dominant artery that gives rise to two obtuse marginal branches. There is no plaque.   Other findings:   Normal pulmonary vein drainage into the left atrium.   Normal left atrial appendage without a thrombus.   Normal size of the pulmonary artery.   IMPRESSION: 1. Low coronary calcium  score of 33.6. This was 50th percentile for age and sex matched control. 2. Normal coronary origin with right dominance. 3. Mild calcified plaque in the proximal LAD causing minimal stenosis (0-24%) 4. CAD-RADS 1. Minimal non-obstructive CAD (0-24%). Consider non-atherosclerotic causes of chest pain. Consider preventive therapy and risk factor modification. 5.  Etiology for cardiomyopathy is nonischemic. __________   2D echo 11/26/2019: 1. Left ventricular ejection fraction, by estimation, is 55 to 60%. The  left ventricle has normal function. The left ventricle has no regional  wall motion abnormalities. Left ventricular diastolic parameters are  consistent with Grade II diastolic  dysfunction (pseudonormalization).   2. Right ventricular systolic function is normal. The right ventricular  size is normal.   3. Left atrial size was severely dilated.   4. The mitral valve is normal in structure. Mild mitral valve  regurgitation.   5. The aortic valve is tricuspid. Aortic valve regurgitation is not  visualized. Mild aortic valve sclerosis is present, with no evidence of  aortic valve stenosis.  __________   Limited echo 01/30/2019: 1.  The left ventricle has low normal systolic function, with an ejection fraction of 50-55%. The cavity size was normal. Left ventricular diastolic Doppler parameters are consistent with impaired relaxation.  2. The right ventricle has normal systolc function. The cavity was normal. There is no increase in right ventricular wall thickness. Unable to estimate RVSP  3. Mitral valve regurgitation is mild to moderate by color flow Doppler. __________   TEE 11/2018: 1. The left ventricle has mild-moderately reduced systolic function, with an ejection fraction of 40-45%. The cavity size was normal. Left ventricular diastolic function could not be evaluated.  2. The right ventricle has normal systolc function. The cavity was normal. There is no increase in right ventricular wall thickness.  3. Left atrial size was mildly dilated.  4. The mitral valve is grossly normal.  5. The tricuspid valve was grossly normal.  6. No intracardiac thrombi or masses were visualized.  7. The patient developed uncontrolled cough few minutes after the probe was inserted due to suspected laryngeal spasm. Thus, a complete study was not performed but I was able to determine that there was no evidence of thrombus. __________   2D echo 11/2018:  1. The left ventricle has moderately reduced systolic function, with an ejection fraction of 35-40%. The cavity size was normal. Left ventricular diastolic function could not be evaluated secondary to atrial fibrillation. Left ventricular diffuse  hypokinesis.  2. The right ventricle has normal systolic function. The cavity was normal. There is mildly increased right ventricular wall thickness.  3. Left atrial size was mild-moderately dilated.  4. Right atrial size was mildly dilated.  5. The mitral valve was not well visualized. Mitral valve regurgitation is moderate by color flow Doppler.  6. The  tricuspid valve is not well visualized. Tricuspid valve regurgitation is mild-moderate.  7.  The aortic valve is tricuspid. Mild thickening of the aortic valve.  8. The aortic root is normal in size and structure.  9. The inferior vena cava was normal in size with <50% respiratory variability.    EKG:  EKG is ordered today.  The EKG ordered today demonstrates NSR, 61 bpm, poor R wave progression along the precordial leads, anterolateral T wave inversion, consistent with prior tracings  Recent Labs: 12/12/2022: BUN 19; Creatinine, Ser 0.86; Potassium 4.7; Sodium 139  Recent Lipid Panel    Component Value Date/Time   CHOL 239 (H) 04/12/2021 1434   TRIG 136.0 04/12/2021 1434   HDL 51.40 04/12/2021 1434   CHOLHDL 5 04/12/2021 1434   VLDL 27.2 04/12/2021 1434   LDLCALC 160 (H) 04/12/2021 1434   LDLDIRECT 175.5 03/19/2013 0930    PHYSICAL EXAM:    VS:  BP 126/70   Pulse 61   Ht 5' (1.524 m)   Wt 145 lb (65.8 kg)   SpO2 96%   BMI 28.32 kg/m   BMI: Body mass index is 28.32 kg/m.  Physical Exam Vitals reviewed.  Constitutional:      Appearance: She is well-developed.  HENT:     Head: Normocephalic and atraumatic.  Eyes:     General:        Right eye: No discharge.        Left eye: No discharge.  Cardiovascular:     Rate and Rhythm: Normal rate and regular rhythm.     Heart sounds: Normal heart sounds, S1 normal and S2 normal. Heart sounds not distant. No midsystolic click and no opening snap. No murmur heard.    No friction rub.  Pulmonary:     Effort: Pulmonary effort is normal. No respiratory distress.     Breath sounds: Normal breath sounds. No decreased breath sounds, wheezing, rhonchi or rales.  Chest:     Chest wall: No tenderness.  Musculoskeletal:     Cervical back: Normal range of motion.     Right lower leg: No edema.     Left lower leg: No edema.  Skin:    General: Skin is warm and dry.     Nails: There is no clubbing.  Neurological:     Mental Status: She is alert and oriented to person, place, and time.  Psychiatric:        Speech: Speech  normal.        Behavior: Behavior normal.        Thought Content: Thought content normal.        Judgment: Judgment normal.     Wt Readings from Last 3 Encounters:  11/13/23 145 lb (65.8 kg)  03/13/23 144 lb (65.3 kg)  01/29/23 144 lb 9.6 oz (65.6 kg)     ASSESSMENT & PLAN:   Persistent A-fib/flutter: Maintaining sinus rhythm on carvedilol  6.25 mg twice daily.  She does notice an increase in tachycardia palpitation burden, likely coinciding with increased stress surrounding the health of her mother.  She has previously noted increased tachycardia palpitation burden with increased stress with improved palpitation and A-fib burden following improvement in stress.  Palpitation burden possibly also exacerbated by patient's inability to use his CPAP more recently.  In this setting, we have elected to defer escalation of pharmacotherapy with plans for patient to reinitiate CPAP therapy.  We are also hopeful that her stress burden will improve following the addition of caretaker assistance  for the patient's mother.  CHA2DS2-VASc at least 6.  She remains on apixaban  5 mg twice daily and is without symptoms concerning for falls or bleeding.  Check renal function, electrolytes, and CBC.  Nonobstructive CAD: No suggestive of angina.  Continue aggressive risk factor modification and primary prevention including apixaban  in place of aspirin given A-fib/flutter, along with rosuvastatin  10 mg 2 days/week (does not tolerate more frequent dosing).  Check lipid panel, direct LDL, and LFT.  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  She remains on carvedilol  6.25 mg twice daily, losartan  12.5 mg daily, and furosemide  20 mg daily.  Check renal function and electrolytes.  HTN: Blood pressure is well-controlled in the office today.  Continue pharmacotherapy as outlined above.  HLD: LDL 160 in 2022.  Currently on rosuvastatin  10 mg 2 days/week (does not tolerate more frequent dosing).   Update lipid panel, direct LDL, and LFT.  OSA: She will resume CPAP usage.     Disposition: F/u with Dr. Nolan Battle or an APP in 6 months, and EP as directed.    Medication Adjustments/Labs and Tests Ordered: Current medicines are reviewed at length with the patient today.  Concerns regarding medicines are outlined above. Medication changes, Labs and Tests ordered today are summarized above and listed in the Patient Instructions accessible in Encounters.   Signed, Melissa Gentleman, PA-C 11/13/2023 4:54 PM     [facility] [place] [address] [place], [place] [address] [phone]

## 2023-11-13 ENCOUNTER — Ambulatory Visit: Attending: Physician Assistant | Admitting: Physician Assistant

## 2023-11-13 ENCOUNTER — Encounter: Payer: Self-pay | Admitting: Physician Assistant

## 2023-11-13 VITALS — BP 126/70 | HR 61 | Ht 60.0 in | Wt 145.0 lb

## 2023-11-13 DIAGNOSIS — I2510 Atherosclerotic heart disease of native coronary artery without angina pectoris: Secondary | ICD-10-CM | POA: Diagnosis not present

## 2023-11-13 DIAGNOSIS — I4819 Other persistent atrial fibrillation: Secondary | ICD-10-CM | POA: Diagnosis not present

## 2023-11-13 DIAGNOSIS — E785 Hyperlipidemia, unspecified: Secondary | ICD-10-CM

## 2023-11-13 DIAGNOSIS — I5032 Chronic diastolic (congestive) heart failure: Secondary | ICD-10-CM | POA: Diagnosis not present

## 2023-11-13 DIAGNOSIS — Z79899 Other long term (current) drug therapy: Secondary | ICD-10-CM | POA: Diagnosis not present

## 2023-11-13 DIAGNOSIS — I5020 Unspecified systolic (congestive) heart failure: Secondary | ICD-10-CM

## 2023-11-13 DIAGNOSIS — I428 Other cardiomyopathies: Secondary | ICD-10-CM

## 2023-11-13 DIAGNOSIS — G4733 Obstructive sleep apnea (adult) (pediatric): Secondary | ICD-10-CM

## 2023-11-13 DIAGNOSIS — I10 Essential (primary) hypertension: Secondary | ICD-10-CM

## 2023-11-13 NOTE — Patient Instructions (Addendum)
 Medication Instructions:  Your physician recommends the following medication changes.  STOP TAKING: Coenzyme Q10  *If you need a refill on your cardiac medications before your next appointment, please call your pharmacy*  Lab Work: Your provider would like for you to have following labs drawn today (CMP, CBC, Lipid, Direct LDL).    Testing/Procedures: No test ordered today   Follow-Up: At [facility], you and your health needs are our priority.  As part of our continuing mission to provide you with exceptional heart care, our providers are all part of one team.  This team includes your primary Cardiologist (physician) and Advanced Practice Providers or APPs (Physician Assistants and Nurse Practitioners) who all work together to provide you with the care you need, when you need it.  Your next appointment:   6 month(s)  Provider:   Varney Gentleman, PA-C

## 2023-11-14 ENCOUNTER — Other Ambulatory Visit: Payer: Self-pay | Admitting: Emergency Medicine

## 2023-11-14 ENCOUNTER — Ambulatory Visit: Payer: Self-pay | Admitting: Physician Assistant

## 2023-11-14 LAB — CBC
Hematocrit: 40.7 % (ref 34.0–46.6)
Hemoglobin: 13.7 g/dL (ref 11.1–15.9)
MCH: 31.3 pg (ref 26.6–33.0)
MCHC: 33.7 g/dL (ref 31.5–35.7)
MCV: 93 fL (ref 79–97)
Platelets: 230 10*3/uL (ref 150–450)
RBC: 4.38 x10E6/uL (ref 3.77–5.28)
RDW: 11.8 % (ref 11.7–15.4)
WBC: 6.1 10*3/uL (ref 3.4–10.8)

## 2023-11-14 LAB — COMPREHENSIVE METABOLIC PANEL WITH GFR
ALT: 17 IU/L (ref 0–32)
AST: 20 IU/L (ref 0–40)
Albumin: 4.2 g/dL (ref 3.8–4.8)
Alkaline Phosphatase: 104 IU/L (ref 44–121)
BUN/Creatinine Ratio: 21 (ref 12–28)
BUN: 16 mg/dL (ref 8–27)
Bilirubin Total: 0.3 mg/dL (ref 0.0–1.2)
CO2: 22 mmol/L (ref 20–29)
Calcium: 9.1 mg/dL (ref 8.7–10.3)
Chloride: 102 mmol/L (ref 96–106)
Creatinine, Ser: 0.78 mg/dL (ref 0.57–1.00)
Globulin, Total: 2 g/dL (ref 1.5–4.5)
Glucose: 93 mg/dL (ref 70–99)
Potassium: 4.8 mmol/L (ref 3.5–5.2)
Sodium: 140 mmol/L (ref 134–144)
Total Protein: 6.2 g/dL (ref 6.0–8.5)
eGFR: 78 mL/min/{1.73_m2} (ref >59)

## 2023-11-14 LAB — LDL CHOLESTEROL, DIRECT: LDL Direct: 140 mg/dL — ABNORMAL HIGH (ref 0–99)

## 2023-11-14 LAB — LIPID PANEL
Chol/HDL Ratio: 4.4 ratio (ref 0.0–4.4)
Cholesterol, Total: 209 mg/dL — ABNORMAL HIGH (ref 100–199)
HDL: 48 mg/dL (ref >39)
LDL Chol Calc (NIH): 138 mg/dL — ABNORMAL HIGH (ref 0–99)
Triglycerides: 129 mg/dL (ref 0–149)
VLDL Cholesterol Cal: 23 mg/dL (ref 5–40)

## 2023-11-14 MED ORDER — EZETIMIBE 10 MG PO TABS: 10.0000 mg | ORAL_TABLET | Freq: Every day | ORAL | 3 refills | Status: DC

## 2023-11-26 DIAGNOSIS — D6869 Other thrombophilia: Secondary | ICD-10-CM | POA: Diagnosis not present

## 2023-11-26 DIAGNOSIS — Z7901 Long term (current) use of anticoagulants: Secondary | ICD-10-CM | POA: Diagnosis not present

## 2023-12-16 ENCOUNTER — Other Ambulatory Visit: Payer: Self-pay | Admitting: Physician Assistant

## 2024-02-02 ENCOUNTER — Other Ambulatory Visit: Payer: Self-pay | Admitting: Internal Medicine

## 2024-02-02 DIAGNOSIS — I4819 Other persistent atrial fibrillation: Secondary | ICD-10-CM

## 2024-02-04 NOTE — Telephone Encounter (Signed)
 Prescription refill request for Eliquis  received. Indication: AF Last office visit: 11/13/23 R Dunn PA-C Scr: 0.78 on 11/13/23  Epic Age: 78 Weight: 65.8kg  Based on above findings Eliquis  5mg  twice daily is the appropriate dose.  Refill approved.

## 2024-02-15 DIAGNOSIS — J019 Acute sinusitis, unspecified: Secondary | ICD-10-CM | POA: Diagnosis not present

## 2024-02-15 DIAGNOSIS — Z03818 Encounter for observation for suspected exposure to other biological agents ruled out: Secondary | ICD-10-CM | POA: Diagnosis not present

## 2024-02-15 DIAGNOSIS — J029 Acute pharyngitis, unspecified: Secondary | ICD-10-CM | POA: Diagnosis not present

## 2024-02-15 DIAGNOSIS — J209 Acute bronchitis, unspecified: Secondary | ICD-10-CM | POA: Diagnosis not present

## 2024-02-16 ENCOUNTER — Other Ambulatory Visit: Payer: Self-pay | Admitting: Physician Assistant

## 2024-02-16 DIAGNOSIS — M7989 Other specified soft tissue disorders: Secondary | ICD-10-CM

## 2024-03-05 ENCOUNTER — Other Ambulatory Visit: Payer: Self-pay

## 2024-03-05 MED ORDER — ROSUVASTATIN CALCIUM 10 MG PO TABS: ORAL_TABLET | ORAL | 0 refills | Status: DC

## 2024-03-13 DIAGNOSIS — H524 Presbyopia: Secondary | ICD-10-CM | POA: Diagnosis not present

## 2024-03-21 ENCOUNTER — Ambulatory Visit (INDEPENDENT_AMBULATORY_CARE_PROVIDER_SITE_OTHER): Admitting: *Deleted

## 2024-03-21 VITALS — Ht 60.0 in | Wt 141.0 lb

## 2024-03-21 DIAGNOSIS — Z1231 Encounter for screening mammogram for malignant neoplasm of breast: Secondary | ICD-10-CM

## 2024-03-21 DIAGNOSIS — Z780 Asymptomatic menopausal state: Secondary | ICD-10-CM | POA: Diagnosis not present

## 2024-03-21 DIAGNOSIS — Z0000 Encounter for general adult medical examination without abnormal findings: Secondary | ICD-10-CM

## 2024-03-21 NOTE — Progress Notes (Signed)
 Subjective:   Melissa Bonilla is a 78 y.o. who presents for a Medicare Wellness preventive visit.  As a reminder, Annual Wellness Visits don't include a physical exam, and some assessments may be limited, especially if this visit is performed virtually. We may recommend an in-person follow-up visit with your provider if needed.  Visit Complete: Virtual I connected with  [name] Lay on 03/21/24 by a audio enabled telemedicine application and verified that I am speaking with the correct person using two identifiers.  Patient Location: Home  Provider Location: Home Office  I discussed the limitations of evaluation and management by telemedicine. The patient expressed understanding and agreed to proceed.  Vital Signs: Because this visit was a virtual/telehealth visit, some criteria may be missing or patient reported. Any vitals not documented were not able to be obtained and vitals that have been documented are patient reported.  VideoDeclined- This patient declined Librarian, academic. Therefore the visit was completed with audio only.  Persons Participating in Visit: Patient.  AWV Questionnaire: No: Patient Medicare AWV questionnaire was not completed prior to this visit.  Cardiac Risk Factors include: advanced age (>69men, >20 women);dyslipidemia;hypertension;Other (see comment), Risk factor comments: CAD     Objective:    Today's Vitals   03/21/24 0808  Weight: 141 lb (64 kg)  Height: 5' (1.524 m)   Body mass index is 27.54 kg/m.     03/21/2024    8:20 AM 05/16/2023    6:50 PM 03/13/2023   12:59 PM 12/16/2021    1:05 PM 11/30/2021    8:32 AM 09/30/2021    9:03 AM 09/20/2021    4:47 PM  Advanced Directives  Does Patient Have a Medical Advance Directive? Yes No Yes Yes No Yes Yes  Type of Estate agent of [place];Living will  Healthcare Power of [place];Living will Living will  Living will Living will  Does patient want to  make changes to medical advance directive?    No - Patient declined     Copy of Healthcare Power of Attorney in Chart? No - copy requested  No - copy requested        Current Medications (verified) Outpatient Encounter Medications as of 03/21/2024  Medication Sig   acetaminophen  (TYLENOL ) 650 MG CR tablet Take 1,300 mg by mouth every 8 (eight) hours as needed for pain.   ascorbic acid (VITAMIN C) 500 MG tablet Take 500 mg by mouth daily.   carvedilol  (COREG ) 6.25 MG tablet TAKE 1 TABLET(6.25 MG) BY MOUTH TWICE DAILY   cholecalciferol  (VITAMIN D ) 25 MCG (1000 UNIT) tablet Take 1,000 Units by mouth daily.   ELIQUIS  5 MG TABS tablet TAKE 1 TABLET(5 MG) BY MOUTH TWICE DAILY   ezetimibe  (ZETIA ) 10 MG tablet Take 1 tablet (10 mg total) by mouth daily.   furosemide  (LASIX ) 20 MG tablet TAKE 1 TABLET(20 MG) BY MOUTH DAILY   losartan  (COZAAR ) 25 MG tablet TAKE 1/2 TABLET(12.5 MG) BY MOUTH DAILY   Magnesium  400 MG CAPS Take 1 capsule by mouth daily at 2 am.   Multiple Vitamin (MULTIVITAMIN WITH MINERALS) TABS tablet Take 1 tablet by mouth daily.   rosuvastatin  (CRESTOR ) 10 MG tablet TAKE 1 TABLET(10 MG) BY MOUTH 2 TIMES A WEEK   vitamin B-12 (CYANOCOBALAMIN ) 1000 MCG tablet Take 1,000 mcg by mouth daily.   No facility-administered encounter medications on file as of 03/21/2024.    Allergies (verified) Codeine, Morphine, and Sulfa antibiotics   History: Past Medical History:  Diagnosis Date   Arrhythmia    atrial fibrillation   CHF (congestive heart failure) (HCC)    Depression    Hyperlipidemia    Hypertension    Menopausal syndrome (hot flashes) 1992   used HRT fo 2 yrs    Obstructive sleep apnea    Persistent atrial fibrillation (HCC)    Shingles rash 09/20/2021   Past Surgical History:  Procedure Laterality Date   APPENDECTOMY  1972   ATRIAL FIBRILLATION ABLATION N/A 04/09/2020   Procedure: ATRIAL FIBRILLATION ABLATION;  Surgeon: Cindie Ole DASEN, MD;  Location: [facility];  Service: Cardiovascular;  Laterality: N/A;   CARDIOVERSION N/A 11/28/2018   Procedure: CARDIOVERSION;  Surgeon: Darron Deatrice LABOR, MD;  Location: [facility] ORS;  Service: Cardiovascular;  Laterality: N/A;   CARDIOVERSION N/A 12/23/2018   Procedure: CARDIOVERSION;  Surgeon: Darron Deatrice LABOR, MD;  Location: [facility] ORS;  Service: Cardiovascular;  Laterality: N/A;   CARDIOVERSION N/A 09/30/2019   Procedure: CARDIOVERSION;  Surgeon: Mady Bruckner, MD;  Location: [facility] ORS;  Service: Cardiovascular;  Laterality: N/A;   CARDIOVERSION N/A 09/30/2021   Procedure: CARDIOVERSION;  Surgeon: Shlomo Wilbert SAUNDERS, MD;  Location: [facility] ENDOSCOPY;  Service: Cardiovascular;  Laterality: N/A;   CESAREAN SECTION     [id number], 1972   TEE WITHOUT CARDIOVERSION N/A 11/28/2018   Procedure: TRANSESOPHAGEAL ECHOCARDIOGRAM (TEE);  Surgeon: Darron Deatrice LABOR, MD;  Location: [facility] ORS;  Service: Cardiovascular;  Laterality: N/A;   Family History  Problem Relation Age of Onset   Arthritis Mother        Rheumatoid   Heart disease Mother        Valvular   Hypertension Mother    Heart disease Father 71       CABG   Arthritis Maternal Grandmother        Rheumatoid   Hypertension Maternal Grandmother    Stroke Maternal Grandmother    Breast cancer Paternal Aunt 30   Breast cancer Paternal Aunt 48   Social History   Socioeconomic History   Marital status: Married    Spouse name: Not on file   Number of children: Not on file   Years of education: Not on file   Highest education level: Not on file  Occupational History    Employer: self    Comment: Owner of a cleaning Business  Tobacco Use   Smoking status: Former    Current packs/day: 0.00    Average packs/day: 0.3 packs/day for 15.0 years (3.8 ttl pk-yrs)    Types: Cigarettes    Start date: 67    Quit date: 65    Years since quitting: 33.7   Smokeless tobacco: Never   Tobacco comments:    Former smoker 09/22/21  Vaping Use   Vaping status: Never Used   Substance and Sexual Activity   Alcohol use: Not Currently    Comment: 1/2 glass of wine on occassion   Drug use: No   Sexual activity: Not on file  Other Topics Concern   Not on file  Social History Narrative   Married   Social Drivers of Health   Financial Resource Strain: Low Risk  (03/21/2024)   Overall Financial Resource Strain (CARDIA)    Difficulty of Paying Living Expenses: Not hard at all  Food Insecurity: No Food Insecurity (03/21/2024)   Hunger Vital Sign    Worried About Running Out of Food in the Last Year: Never true    Ran Out of Food in the Last Year: Never true  Transportation Needs: No Transportation Needs (03/21/2024)   PRAPARE - Administrator, Civil Service (Medical): No    Lack of Transportation (Non-Medical): No  Physical Activity: Inactive (03/21/2024)   Exercise Vital Sign    Days of Exercise per Week: 0 days    Minutes of Exercise per Session: 0 min  Stress: No Stress Concern Present (03/21/2024)   [employer] of Occupational Health - Occupational Stress Questionnaire    Feeling of Stress: Only a little  Social Connections: Moderately Integrated (03/21/2024)   Social Connection and Isolation Panel    Frequency of Communication with Friends and Family: More than three times a week    Frequency of Social Gatherings with Friends and Family: More than three times a week    Attends Religious Services: More than 4 times per year    Active Member of [employer] or Organizations: No    Attends Banker Meetings: Never    Marital Status: Married    Tobacco Counseling Counseling given: Not Answered Tobacco comments: Former smoker 09/22/21    Clinical Intake:  Pre-visit preparation completed: Yes  Pain : No/denies pain     BMI - recorded: 27.54 Nutritional Status: BMI 25 -29 Overweight Nutritional Risks: None Diabetes: No  No results found for: HGBA1C   How often do you need to have someone help you when you read  instructions, pamphlets, or other written materials from your doctor or pharmacy?: 1 - Never  Interpreter Needed?: No  Information entered by :: R. Anetra Czerwinski LPN   Activities of Daily Living     03/21/2024    8:10 AM  In your present state of health, do you have any difficulty performing the following activities:  Hearing? 0  Vision? 0  Difficulty concentrating or making decisions? 0  Walking or climbing stairs? 0  Dressing or bathing? 0  Doing errands, shopping? 0  Preparing Food and eating ? N  Using the Toilet? N  In the past six months, have you accidently leaked urine? Y  Do you have problems with loss of bowel control? N  Managing your Medications? N  Managing your Finances? N  Housekeeping or managing your Housekeeping? N    Patient Care Team: Marylynn Verneita CROME, MD as PCP - General (Internal Medicine) End, Lonni, MD as PCP - Cardiology (Cardiology) Cindie Ole DASEN, MD as PCP - Electrophysiology (Cardiology) Darron Deatrice LABOR, MD as Consulting Physician (Cardiology)  I have updated your Care Teams any recent Medical Services you may have received from other providers in the past year.     Assessment:   This is a routine wellness examination for [facility].  Hearing/Vision screen Hearing Screening - Comments:: No issues Vision Screening - Comments:: glasses   Goals Addressed             This Visit's Progress    Patient Stated       Wants to get back to walking on a regular basis       Depression Screen     03/21/2024    8:14 AM 03/13/2023   12:52 PM 03/13/2022   11:28 AM 12/16/2021    1:09 PM 12/07/2021    1:12 PM 07/20/2021    3:41 PM 04/12/2021    1:57 PM  PHQ 2/9 Scores  PHQ - 2 Score 0 0 0 0 0 2 0  PHQ- 9 Score 3 0    4     Fall Risk     03/21/2024  8:11 AM 03/13/2023   12:48 PM 03/13/2022   11:28 AM 12/07/2021    1:12 PM 09/20/2021    3:33 PM  Fall Risk   Falls in the past year? 0 0 1 1 0  Number falls in past yr: 0 0 0 0   Injury with Fall?  0 0 1 0   Risk for fall due to : No Fall Risks No Fall Risks History of fall(s) History of fall(s) No Fall Risks  Follow up Falls evaluation completed;Falls prevention discussed Falls prevention discussed;Falls evaluation completed Falls evaluation completed  Falls evaluation completed  Falls evaluation completed      Data saved with a previous flowsheet row definition    MEDICARE RISK AT HOME:  Medicare Risk at Home Any stairs in or around the home?: No If so, are there any without handrails?: No Home free of loose throw rugs in walkways, pet beds, electrical cords, etc?: Yes Adequate lighting in your home to reduce risk of falls?: Yes Life alert?: No Use of a cane, walker or w/c?: No Grab bars in the bathroom?: Yes Shower chair or bench in shower?: No Elevated toilet seat or a handicapped toilet?: Yes  TIMED UP AND GO:  Was the test performed?  No  Cognitive Function: 6CIT completed        03/21/2024    8:21 AM 03/13/2023   12:59 PM 04/09/2019    1:24 PM  6CIT Screen  What Year? 0 points 0 points 0 points  What month? 0 points 0 points 0 points  What time? 0 points 0 points 0 points  Count back from 20 0 points 0 points 0 points  Months in reverse 0 points 0 points 0 points  Repeat phrase 0 points 2 points 0 points  Total Score 0 points 2 points 0 points    Immunizations Immunization History  Administered Date(s) Administered   Influenza Split 05/16/2013   Influenza,inj,Quad PF,6+ Mos 03/05/2015   Influenza-Unspecified 04/19/2012   Moderna Sars-Covid-2 Vaccination 01/08/2020, 04/03/2020   PNEUMOCOCCAL CONJUGATE-20 04/12/2021   Pneumococcal Conjugate-13 01/12/2014   Pneumococcal Polysaccharide-23 03/05/2015   Tdap 12/16/2021   Zoster Recombinant(Shingrix) 10/17/2021, 12/16/2021   Zoster, Live 11/21/2011    Screening Tests Health Maintenance  Topic Date Due   COVID-19 Vaccine (3 - Moderna risk series) 05/01/2020   Influenza Vaccine  01/18/2024   Medicare  Annual Wellness (AWV)  03/12/2024   DTaP/Tdap/Td (2 - Td or Tdap) 12/17/2031   Pneumococcal Vaccine: 50+ Years  Completed   DEXA SCAN  Completed   Hepatitis C Screening  Completed   Zoster Vaccines- Shingrix  Completed   HPV VACCINES  Aged Out   Meningococcal B Vaccine  Aged Out   Mammogram  Discontinued   Colonoscopy  Discontinued    Health Maintenance Items Addressed: Mammogram ordered, DEXA ordered, Patient declnes covid and flu vaccines.  Patient is interested in repeating her Dexa because she may consider treatment at this time.   Additional Screening:  Vision Screening: Recommended annual ophthalmology exams for early detection of glaucoma and other disorders of the eye. Is the patient up to date with their annual eye exam?  Yes  Who is the provider or what is the name of the office in which the patient attends annual eye exams? [facility]  Dental Screening: Recommended annual dental exams for proper oral hygiene  Community Resource Referral / Chronic Care Management: CRR required this visit?  No   CCM required this visit?  No   Plan:  I have personally reviewed and noted the following in the patient's chart:   Medical and social history Use of alcohol, tobacco or illicit drugs  Current medications and supplements including opioid prescriptions. Patient is not currently taking opioid prescriptions. Functional ability and status Nutritional status Physical activity Advanced directives List of other physicians Hospitalizations, surgeries, and ER visits in previous 12 months Vitals Screenings to include cognitive, depression, and falls Referrals and appointments  In addition, I have reviewed and discussed with patient certain preventive protocols, quality metrics, and best practice recommendations. A written personalized care plan for preventive services as well as general preventive health recommendations were provided to patient.   Angeline Fredericks,  LPN   [id number]   After Visit Summary: (MyChart) Due to this being a telephonic visit, the after visit summary with patients personalized plan was offered to patient via MyChart   Notes: Nothing significant to report at this time.

## 2024-03-21 NOTE — Patient Instructions (Signed)
 Ms. Vohs,  Thank you for taking the time for your Medicare Wellness Visit. I appreciate your continued commitment to your health goals. Please review the care plan we discussed, and feel free to reach out if I can assist you further.  Medicare recommends these wellness visits once per year to help you and your care team stay ahead of potential health issues. These visits are designed to focus on prevention, allowing your provider to concentrate on managing your acute and chronic conditions during your regular appointments.  Please note that Annual Wellness Visits do not include a physical exam. Some assessments may be limited, especially if the visit was conducted virtually. If needed, we may recommend a separate in-person follow-up with your provider.  Ongoing Care Seeing your primary care provider every 3 to 6 months helps us  monitor your health and provide consistent, personalized care.  Consider updating your flu vaccine. You have an order for:  []   2D Mammogram  [x]   3D Mammogram  [x]   Bone Density     Please call for appointment:  [facility]  [address] [place] [address] [phone]     Make sure to wear two-piece clothing.  No lotions, powders, or deodorants the day of the appointment. Make sure to bring picture ID and insurance card.  Bring list of medications you are currently taking including any supplements.    Referrals If a referral was made during today's visit and you haven't received any updates within two weeks, please contact the referred provider directly to check on the status.  Recommended Screenings:  Health Maintenance  Topic Date Due   COVID-19 Vaccine (3 - Moderna risk series) 05/01/2020   Flu Shot  01/18/2024   Medicare Annual Wellness Visit  03/21/2025   DTaP/Tdap/Td vaccine (2 - Td or Tdap) 12/17/2031   Pneumococcal Vaccine for age over 51  Completed   DEXA scan (bone density  measurement)  Completed   Hepatitis C Screening  Completed   Zoster (Shingles) Vaccine  Completed   HPV Vaccine  Aged Out   Meningitis B Vaccine  Aged Out   Breast Cancer Screening  Discontinued   Colon Cancer Screening  Discontinued       03/21/2024    8:20 AM  Advanced Directives  Does Patient Have a Medical Advance Directive? Yes  Type of Estate agent of [place];Living will  Copy of Healthcare Power of Attorney in Chart? No - copy requested   Advance Care Planning is important because it: Ensures you receive medical care that aligns with your values, goals, and preferences. Provides guidance to your family and loved ones, reducing the emotional burden of decision-making during critical moments.  Vision: Annual vision screenings are recommended for early detection of glaucoma, cataracts, and diabetic retinopathy. These exams can also reveal signs of chronic conditions such as diabetes and high blood pressure.  Dental: Annual dental screenings help detect early signs of oral cancer, gum disease, and other conditions linked to overall health, including heart disease and diabetes.  Please see the attached documents for additional preventive care recommendations.

## 2024-04-10 ENCOUNTER — Ambulatory Visit: Admitting: Internal Medicine

## 2024-04-10 ENCOUNTER — Encounter: Payer: Self-pay | Admitting: Internal Medicine

## 2024-04-10 VITALS — BP 126/82 | HR 62 | Ht 60.0 in | Wt 140.6 lb

## 2024-04-10 DIAGNOSIS — E559 Vitamin D deficiency, unspecified: Secondary | ICD-10-CM

## 2024-04-10 DIAGNOSIS — G4709 Other insomnia: Secondary | ICD-10-CM

## 2024-04-10 DIAGNOSIS — T466X5A Adverse effect of antihyperlipidemic and antiarteriosclerotic drugs, initial encounter: Secondary | ICD-10-CM

## 2024-04-10 DIAGNOSIS — I2510 Atherosclerotic heart disease of native coronary artery without angina pectoris: Secondary | ICD-10-CM

## 2024-04-10 DIAGNOSIS — Z0000 Encounter for general adult medical examination without abnormal findings: Secondary | ICD-10-CM

## 2024-04-10 DIAGNOSIS — M8000XD Age-related osteoporosis with current pathological fracture, unspecified site, subsequent encounter for fracture with routine healing: Secondary | ICD-10-CM

## 2024-04-10 DIAGNOSIS — I10 Essential (primary) hypertension: Secondary | ICD-10-CM | POA: Diagnosis not present

## 2024-04-10 DIAGNOSIS — M791 Myalgia, unspecified site: Secondary | ICD-10-CM

## 2024-04-10 DIAGNOSIS — I428 Other cardiomyopathies: Secondary | ICD-10-CM

## 2024-04-10 DIAGNOSIS — E785 Hyperlipidemia, unspecified: Secondary | ICD-10-CM | POA: Diagnosis not present

## 2024-04-10 LAB — VITAMIN D 25 HYDROXY (VIT D DEFICIENCY, FRACTURES): VITD: 40.45 ng/mL (ref 30.00–100.00)

## 2024-04-10 MED ORDER — ROSUVASTATIN CALCIUM 10 MG PO TABS: ORAL_TABLET | ORAL | 3 refills | Status: DC

## 2024-04-10 MED ORDER — TRAZODONE HCL 50 MG PO TABS: 25.0000 mg | ORAL_TABLET | Freq: Every evening | ORAL | 0 refills | Status: DC | PRN

## 2024-04-10 NOTE — Progress Notes (Signed)
 Patient ID: Melissa Bonilla, female    DOB: 26-Oct-1945  Age: 78 y.o. MRN: [id number]  The patient is here for annual preventive examination and management of other chronic and acute problems.   The risk factors are reflected in the social history.   The roster of all physicians providing medical care to patient - is listed in the Snapshot section of the chart.   Activities of daily living:  The patient is 100% independent in all ADLs: dressing, toileting, feeding as well as independent mobility   Home safety : The patient has smoke detectors in the home. They wear seatbelts.  There are no unsecured firearms at home. There is no violence in the home.    There is no risks for hepatitis, STDs or HIV. There is no   history of blood transfusion. They have no travel history to infectious disease endemic areas of the world.   The patient has seen their dentist in the last six month. They have seen their eye doctor in the last year. The patient  denies slight hearing difficulty with regard to whispered voices and some television programs.  They have deferred audiologic testing in the last year.  They do not  have excessive sun exposure. Discussed the need for sun protection: hats, long sleeves and use of sunscreen if there is significant sun exposure.    Diet: the importance of a healthy diet is discussed. They do have a healthy diet.   The benefits of regular aerobic exercise were discussed. The patient  has not been active since her mother became   bed ridden and she stays with her daily .    Depression screen: there are no signs or negative symptoms of depression- irritability, change in appetite, anhedonia, sadness/tearfulness.   The following portions of the patient's history were reviewed and updated as appropriate: allergies, current medications, past family history, past medical history,  past surgical history, past social history  and problem list.   Visual acuity was not assessed per patient  preference since the patient has regular follow up with an  ophthalmologist. Hearing and body mass index were assessed and reviewed.    During the course of the visit the patient was educated and counseled about appropriate screening and preventive services including : fall prevention , diabetes screening, nutrition counseling, colorectal cancer screening, and recommended immunizations.    Chief Complaint:  caregiver fatigue :  Melissa Bonilla is 78 yrs old and was using a wheelchair until 2 months ago.  Now bedridden.  She and her daughter in law are sharing the responsibility of providing 24 hours care .   Having insomnia due to anxiety  .early waking.   Wakes up 2 to 3 times per night . Bedtime hygiene reviewed,  Patient has not  been using an electronic book to read before bed.  Does not drink caffeinated beverages after 3 PM.  Only voids  bladder once per night.  No snoring partner. Does not drink alcohol to excess.  Not exercising excessively in the evening. Patient does have a history of anxiety but does not lie awake worrying about issues that cannot be resolved. Does not take stimulants. .    Review of Symptoms  Patient denies headache, fevers, malaise, unintentional weight loss, skin rash, eye pain, sinus congestion and sinus pain, sore throat, dysphagia,  hemoptysis , cough, dyspnea, wheezing, chest pain, palpitations, orthopnea, edema, abdominal pain, nausea, melena, diarrhea, constipation, flank pain, dysuria, hematuria, urinary  Frequency, nocturia, numbness, tingling, seizures,  Focal weakness, Loss of consciousness,  Tremor, depression, anxiety, and suicidal ideation.    Physical Exam:  BP 126/82   Pulse 62   Ht 5' (1.524 m)   Wt 140 lb 9.6 oz (63.8 kg)   SpO2 94%   BMI 27.46 kg/m    Physical Exam Vitals reviewed.  Constitutional:      General: She is not in acute distress.    Appearance: Normal appearance. She is normal weight. She is not ill-appearing, toxic-appearing or  diaphoretic.  HENT:     Head: Normocephalic.  Eyes:     General: No scleral icterus.       Right eye: No discharge.        Left eye: No discharge.     Conjunctiva/sclera: Conjunctivae normal.  Cardiovascular:     Rate and Rhythm: Normal rate and regular rhythm.     Heart sounds: Normal heart sounds.  Pulmonary:     Effort: Pulmonary effort is normal. No respiratory distress.     Breath sounds: Normal breath sounds.  Musculoskeletal:        General: Normal range of motion.  Skin:    General: Skin is warm and dry.  Neurological:     General: No focal deficit present.     Mental Status: She is alert and oriented to person, place, and time. Mental status is at baseline.  Psychiatric:        Mood and Affect: Mood normal.        Behavior: Behavior normal.        Thought Content: Thought content normal.        Judgment: Judgment normal.     Assessment and Plan: Nonischemic cardiomyopathy (HCC) Assessment & Plan: She denies dyspnea at rest and with walking.  Ambulatory HR is usually 105 or below via Fit bit.    Hyperlipidemia with target LDL less than 100 Assessment & Plan: She did not tolerate  high potency statin therapy more than 2/week,  was switched to zetia  in latee Mau.  Will recommend PCSK9 inihibitor.    Lab Results  Component Value Date   CHOL 209 (H) 11/13/2023   HDL 48 11/13/2023   LDLCALC 138 (H) 11/13/2023   LDLDIRECT 140 (H) 11/13/2023   TRIG 129 11/13/2023   CHOLHDL 4.4 11/13/2023      Vitamin D  deficiency -     VITAMIN D  25 Hydroxy (Vit-D Deficiency, Fractures)  Coronary artery disease involving native coronary artery of native heart without angina pectoris Assessment & Plan: she has been asymptomatic, is taking her medications including daily aspirin, ACE inhibitor, beta blocker and statin.  Continue semi annual follow up with cardiology.      Myalgia due to statin  Age-related osteoporosis with current pathological fracture with routine healing,  subsequent encounter Assessment & Plan: Continue alendronate  , started in June 2023 after she sustained 4 thoracic vertebral fractures (T8 with 40% reduction in height).  Repeat DEXA  due in June 2024.  Continue calcium  supplementation and vitamin D  supplementation for chronically low D   Routine general medical examination at a health care facility Assessment & Plan: age appropriate education and counseling updated, referrals for preventative services and immunizations addressed, dietary and smoking counseling addressed, most recent labs reviewed.  I have personally reviewed and have noted:   1) the patient's medical and social history 2) The pt's use of alcohol, tobacco, and illicit drugs 3) The patient's current medications and supplements 4) Functional ability including ADL's, fall risk, home safety  risk, hearing and visual impairment 5) Diet and physical activities 6) Evidence for depression or mood disorder 7) The patient's height, weight, and BMI have been recorded in the chart   I have made referrals, and provided counseling and education based on review of the above    Essential hypertension Assessment & Plan: Well controlled on current regimen of losartan , metoprolol  and diltiazem .  Lab Results  Component Value Date   CREATININE 0.78 11/13/2023   Lab Results  Component Value Date   NA 140 11/13/2023   K 4.8 11/13/2023   CL 102 11/13/2023   CO2 22 11/13/2023      Other insomnia Assessment & Plan: Patient  has been having some trouble sleeping.  Wakes up 2 to 3 times per night . Bedtime hygiene reviewed,    Does not drink caffeinated beverages after 3 PM.  Only voids  bladder once per night.  No snoring partner. Does not drink alcohol to excess.  Not exercising excessively in the evening. Patient does have a history of anxiety but does not lie awake worrying about issues that cannot be resolved. Does not take stimulants. .   Rec trial of trazodone .   Other orders -      Rosuvastatin  Calcium ; TAKE 1 TABLET(10 MG) BY MOUTH 2 TIMES A WEEK  Dispense: 24 tablet; Refill: 3 -     traZODone HCl; Take 0.5-1 tablets (25-50 mg total) by mouth at bedtime as needed for sleep.  Dispense: 90 tablet; Refill: 0    Return in about 6 months (around 10/09/2024).  Verneita LITTIE Kettering, MD

## 2024-04-10 NOTE — Patient Instructions (Signed)
 I recommend that you and your daughter in law ALLOW your mother to buy you a stationery bike to use while you are watching her     I recommend getting the majority of your calcium  and Vitamin D   through diet rather than supplements given the recent association of calcium  supplements with increased coronary artery calcium  scores.  You need 1200 mg calcium  daily and 1000 IUs of Vit D (more if your level is low today) if you have osteopenia  Try using Tums,  Protein drinks (like Ensure,  Premier Protein, or Muscle Milk), soy,  almond and cashew milks which are fortified and carried by  most grocery stores   in the dairy  Section.   They are lactose free.  Soy milk should not be used if you have a history of breast cancer.   If you want to make your own protein drink, try the one made by  [facility] .  It comes in vanilla, chocolate and salted caramel ,  And is  plant based.  You can  Mix it with one of the non dairy milks,  And add PB2 peanut powder  (peanut butter 905 of the fat removed)  For a great breakfast

## 2024-04-10 NOTE — Assessment & Plan Note (Signed)
She denies dyspnea at rest and with walking.  Ambulatory HR is usually 105 or below via [employer].  

## 2024-04-10 NOTE — Assessment & Plan Note (Addendum)
 She did not tolerate  high potency statin therapy more than 2/week,  was switched to zetia  in latee Mau.  Will recommend PCSK9 inihibitor.    Lab Results  Component Value Date   CHOL 209 (H) 11/13/2023   HDL 48 11/13/2023   LDLCALC 138 (H) 11/13/2023   LDLDIRECT 140 (H) 11/13/2023   TRIG 129 11/13/2023   CHOLHDL 4.4 11/13/2023

## 2024-04-12 DIAGNOSIS — G4700 Insomnia, unspecified: Secondary | ICD-10-CM | POA: Insufficient documentation

## 2024-04-12 DIAGNOSIS — M791 Myalgia, unspecified site: Secondary | ICD-10-CM | POA: Insufficient documentation

## 2024-04-12 NOTE — Assessment & Plan Note (Addendum)
 Well controlled on current regimen of losartan , metoprolol  and diltiazem .  Lab Results  Component Value Date   CREATININE 0.78 11/13/2023   Lab Results  Component Value Date   NA 140 11/13/2023   K 4.8 11/13/2023   CL 102 11/13/2023   CO2 22 11/13/2023

## 2024-04-12 NOTE — Assessment & Plan Note (Signed)

## 2024-04-12 NOTE — Assessment & Plan Note (Signed)
 Patient  has been having some trouble sleeping.  Wakes up 2 to 3 times per night . Bedtime hygiene reviewed,    Does not drink caffeinated beverages after 3 PM.  Only voids  bladder once per night.  No snoring partner. Does not drink alcohol to excess.  Not exercising excessively in the evening. Patient does have a history of anxiety but does not lie awake worrying about issues that cannot be resolved. Does not take stimulants. .   Rec trial of trazodone .

## 2024-04-12 NOTE — Assessment & Plan Note (Signed)
 she has been asymptomatic, is taking her medications including daily aspirin, ACE inhibitor, beta blocker and statin.  Continue semi annual follow up with cardiology.

## 2024-04-12 NOTE — Assessment & Plan Note (Signed)
Continue alendronate , started in June 2023 after she sustained 4 thoracic vertebral fractures (T8 with 40% reduction in height).  Repeat DEXA  due in June 2024.  Continue calcium supplementation and vitamin D supplementation for chronically low D

## 2024-04-13 ENCOUNTER — Ambulatory Visit: Payer: Self-pay | Admitting: Internal Medicine

## 2024-04-17 ENCOUNTER — Ambulatory Visit
Admission: RE | Admit: 2024-04-17 | Discharge: 2024-04-17 | Disposition: A | Discharge status: Home / Self Care | Source: Ambulatory Visit | Attending: Internal Medicine | Admitting: Internal Medicine

## 2024-04-17 DIAGNOSIS — M810 Age-related osteoporosis without current pathological fracture: Secondary | ICD-10-CM | POA: Diagnosis not present

## 2024-04-17 DIAGNOSIS — Z780 Asymptomatic menopausal state: Secondary | ICD-10-CM | POA: Insufficient documentation

## 2024-04-17 DIAGNOSIS — Z1231 Encounter for screening mammogram for malignant neoplasm of breast: Secondary | ICD-10-CM | POA: Insufficient documentation

## 2024-04-20 ENCOUNTER — Ambulatory Visit: Payer: Self-pay | Admitting: Internal Medicine

## 2024-04-21 NOTE — Telephone Encounter (Signed)
 Copied from CRM #[id number]. Topic: Clinical - Medication Question >> Apr 21, 2024 10:29 AM Victoria A wrote: Reason for CRM: Agent read message from Dr.Tullo to Patient, Patient said that she is worried about taking medication because it can cause issues with your bones. Patient would like a call back regarding medication please

## 2024-04-23 ENCOUNTER — Ambulatory Visit: Payer: Self-pay | Admitting: *Deleted

## 2024-04-23 NOTE — Telephone Encounter (Signed)
 Spoke with pt and gave her Dr. Lula instructions. Also sent it to pt through mychart to refer back to.

## 2024-04-23 NOTE — Telephone Encounter (Signed)
 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 See triage note.

## 2024-04-23 NOTE — Telephone Encounter (Signed)
 FYI Only or Action Required?: Action required by provider: medication request.  Patient was last seen in primary care on 04/10/2024 by Marylynn Verneita CROME, MD.  Called Nurse Triage reporting Cough.  Symptoms began yesterday.  Interventions attempted: Nothing.  Symptoms are: gradually worsening.  Triage Disposition: See Physician Within 24 Hours  Patient/caregiver understands and will follow disposition?: No, wishes to speak with PCP  Copied from CRM [id number]. Topic: Clinical - Red Word Triage >> Apr 23, 2024 11:58 AM Alfonso ORN wrote: Red Word that prompted transfer to Nurse Triage: extreme cold and cough last two days symptoms: runny nose, sore throat and cough , headache (acute pain) .  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 Patient is requesting possible treatment without appointment- she states she is current with her visits and was just seen last month.   1. ONSET: When did the cough begin?      yesterday 2. SEVERITY: How bad is the cough today?      Up all night with cough 3. SPUTUM: Describe the color of your sputum (e.g., none, dry cough; clear, white, yellow, green)     clear 4. HEMOPTYSIS: Are you coughing up any blood? If Yes, ask: How much? (e.g., flecks, streaks, tablespoons, etc.)     no 5. DIFFICULTY BREATHING: Are you having difficulty breathing? If Yes, ask: How bad is it? (e.g., mild, moderate, severe)      no 6. FEVER: Do you have a fever? If Yes, ask: What is your temperature, how was it measured, and when did it start?     no 7. CARDIAC HISTORY: Do you have any history of heart disease? (e.g., heart attack, congestive heart failure)       no 8. LUNG HISTORY: Do you have any history of lung disease?  (e.g., pulmonary embolus, asthma, emphysema)     no 9. PE RISK FACTORS: Do you have a history of blood clots? (or: recent major surgery, recent prolonged travel, bedridden)     no 10. OTHER SYMPTOMS: Do you have any other symptoms? (e.g., runny nose, wheezing, chest pain)       Sore throat, headache  Husband was recently ill.  Protocols used: Cough - Acute Productive-A-AH

## 2024-05-28 ENCOUNTER — Other Ambulatory Visit: Payer: Self-pay | Admitting: Internal Medicine

## 2024-07-01 ENCOUNTER — Encounter: Payer: Self-pay | Admitting: Internal Medicine

## 2024-07-01 ENCOUNTER — Ambulatory Visit (INDEPENDENT_AMBULATORY_CARE_PROVIDER_SITE_OTHER): Admitting: Internal Medicine

## 2024-07-01 VITALS — BP 128/78 | HR 70 | Temp 97.7°F | Ht 60.0 in | Wt 149.0 lb

## 2024-07-01 DIAGNOSIS — M8000XD Age-related osteoporosis with current pathological fracture, unspecified site, subsequent encounter for fracture with routine healing: Secondary | ICD-10-CM | POA: Diagnosis not present

## 2024-07-01 DIAGNOSIS — E785 Hyperlipidemia, unspecified: Secondary | ICD-10-CM

## 2024-07-01 DIAGNOSIS — G4733 Obstructive sleep apnea (adult) (pediatric): Secondary | ICD-10-CM

## 2024-07-01 DIAGNOSIS — R5383 Other fatigue: Secondary | ICD-10-CM | POA: Diagnosis not present

## 2024-07-01 DIAGNOSIS — I10 Essential (primary) hypertension: Secondary | ICD-10-CM | POA: Diagnosis not present

## 2024-07-01 DIAGNOSIS — R7301 Impaired fasting glucose: Secondary | ICD-10-CM | POA: Diagnosis not present

## 2024-07-01 MED ORDER — ALENDRONATE SODIUM 70 MG PO TABS: 70.0000 mg | ORAL_TABLET | ORAL | 11 refills | Status: AC

## 2024-07-01 MED ORDER — TRAZODONE HCL 50 MG PO TABS: 25.0000 mg | ORAL_TABLET | Freq: Every evening | ORAL | 0 refills | Status: AC | PRN

## 2024-07-01 NOTE — Progress Notes (Unsigned)
 "  Subjective:  Patient ID: Melissa Bonilla, female    DOB: [birth year]  Age: 79 y.o. MRN: [id number]  CC: The primary encounter diagnosis was Essential hypertension. Diagnoses of Hyperlipidemia with target LDL less than 100, Impaired fasting glucose, and Other fatigue were also pertinent to this visit.   HPI Melissa Bonilla presents for  Chief Complaint  Patient presents with   Hypertension   Hyperlipidemia   1) osteoporosis: stopped alendronate   due to concern about OCN of the jaw                    2) caregiver fatigue  mother is 66,  bedridden  3) not using CPAP due to cough while using it which has resolved. Also inconvenient when staying at mother's using 1/2 tablet of trazodone   m  feels better without it   Outpatient Medications Prior to Visit  Medication Sig Dispense Refill   acetaminophen  (TYLENOL ) 650 MG CR tablet Take 1,300 mg by mouth every 8 (eight) hours as needed for pain.     ascorbic acid (VITAMIN C) 500 MG tablet Take 500 mg by mouth daily.     carvedilol  (COREG ) 6.25 MG tablet TAKE 1 TABLET(6.25 MG) BY MOUTH TWICE DAILY 180 tablet 2   cholecalciferol  (VITAMIN D ) 25 MCG (1000 UNIT) tablet Take 1,000 Units by mouth daily.     ELIQUIS  5 MG TABS tablet TAKE 1 TABLET(5 MG) BY MOUTH TWICE DAILY 180 tablet 1   ezetimibe  (ZETIA ) 10 MG tablet Take 1 tablet (10 mg total) by mouth daily. 90 tablet 3   furosemide  (LASIX ) 20 MG tablet TAKE 1 TABLET(20 MG) BY MOUTH DAILY 90 tablet 1   losartan  (COZAAR ) 25 MG tablet TAKE 1/2 TABLET(12.5 MG) BY MOUTH DAILY 45 tablet 3   Magnesium  400 MG CAPS Take 1 capsule by mouth daily at 2 am.     Multiple Vitamin (MULTIVITAMIN WITH MINERALS) TABS tablet Take 1 tablet by mouth daily.     rosuvastatin  (CRESTOR ) 10 MG tablet TAKE 1 TABLET(10 MG) BY MOUTH 2 TIMES A WEEK 24 tablet 3   traZODone  (DESYREL ) 50 MG tablet Take 0.5-1 tablets (25-50 mg total) by mouth at bedtime as needed for sleep. 90 tablet 0   vitamin B-12 (CYANOCOBALAMIN ) 1000 MCG  tablet Take 1,000 mcg by mouth daily.     No facility-administered medications prior to visit.    Review of Systems;  Patient denies headache, fevers, malaise, unintentional weight loss, skin rash, eye pain, sinus congestion and sinus pain, sore throat, dysphagia,  hemoptysis , cough, dyspnea, wheezing, chest pain, palpitations, orthopnea, edema, abdominal pain, nausea, melena, diarrhea, constipation, flank pain, dysuria, hematuria, urinary  Frequency, nocturia, numbness, tingling, seizures,  Focal weakness, Loss of consciousness,  Tremor, insomnia, depression, anxiety, and suicidal ideation.      Objective:  BP 128/78 (BP Location: Left Arm, Patient Position: Sitting)   Pulse 70   Temp 97.7 F (36.5 C) (Oral)   Ht 5' (1.524 m)   Wt 149 lb (67.6 kg)   SpO2 94%   BMI 29.10 kg/m   BP Readings from Last 3 Encounters:  07/01/24 128/78  04/10/24 126/82  11/13/23 126/70    Wt Readings from Last 3 Encounters:  07/01/24 149 lb (67.6 kg)  04/10/24 140 lb 9.6 oz (63.8 kg)  03/21/24 141 lb (64 kg)    Physical Exam  No results found for: HGBA1C  Lab Results  Component Value Date   CREATININE 0.78 11/13/2023   CREATININE  0.86 12/12/2022   CREATININE 0.92 11/09/2022    Lab Results  Component Value Date   WBC 6.1 11/13/2023   HGB 13.7 11/13/2023   HCT 40.7 11/13/2023   PLT 230 11/13/2023   GLUCOSE 93 11/13/2023   CHOL 209 (H) 11/13/2023   TRIG 129 11/13/2023   HDL 48 11/13/2023   LDLDIRECT 140 (H) 11/13/2023   LDLCALC 138 (H) 11/13/2023   ALT 17 11/13/2023   AST 20 11/13/2023   NA 140 11/13/2023   K 4.8 11/13/2023   CL 102 11/13/2023   CREATININE 0.78 11/13/2023   BUN 16 11/13/2023   CO2 22 11/13/2023   TSH 2.809 05/10/2020   INR 1.3 (H) 11/26/2018    MM 3D SCREENING MAMMOGRAM BILATERAL BREAST Result Date: 04/21/2024 CLINICAL DATA:  Screening. EXAM: DIGITAL SCREENING BILATERAL MAMMOGRAM WITH TOMOSYNTHESIS AND CAD TECHNIQUE: Bilateral screening digital  craniocaudal and mediolateral oblique mammograms were obtained. Bilateral screening digital breast tomosynthesis was performed. The images were evaluated with computer-aided detection. COMPARISON:  Previous exam(s). ACR Breast Density Category b: There are scattered areas of fibroglandular density. FINDINGS: There are no findings suspicious for malignancy. IMPRESSION: No mammographic evidence of malignancy. A result letter of this screening mammogram will be mailed directly to the patient. RECOMMENDATION: Screening mammogram in one year. (Code:SM-B-01Y) BI-RADS CATEGORY  1: Negative. Electronically Signed   By: Corean Salter M.D.   On: 04/21/2024 09:50   DG Bone Density Result Date: 04/17/2024 EXAM: DUAL X-RAY ABSORPTIOMETRY (DXA) FOR BONE MINERAL DENSITY 04/17/2024 8:59 am CLINICAL DATA:  79 year old Female Postmenopausal. Postmenopausal,, estrogen deficiency TECHNIQUE: An axial (e.g., hips, spine) and/or appendicular (e.g., radius) exam was performed, as appropriate, using GE Secretary/administrator at [facility]. Images are obtained for bone mineral density measurement and are not obtained for diagnostic purposes. [id number] Exclusions: L3-L4 due to degenerative changes. COMPARISON:  05/10/2021. FINDINGS: Scan quality: Good. LUMBAR SPINE (L1-L2): BMD (in g/cm2): 0.760 T-score: -3.4 Z-score: -1.6 Rate of change from previous exam: No significant rate of change from previous exam. LEFT FEMORAL NECK: BMD (in g/cm2): 0.541 T-score: -3.6 Z-score: -1.5 LEFT TOTAL HIP: BMD (in g/cm2): 0.655 T-score: -2.8 Z-score: -0.9 RIGHT FEMORAL NECK: BMD (in g/cm2): 0.577 T-score: -3.3 Z-score: -1.3 RIGHT TOTAL HIP: BMD (in g/cm2): 0.655 T-score: -2.8 Z-score: -0.9 DUAL-FEMUR TOTAL MEAN: Rate of change from previous exam: No significant rate of change from previous exam. FRAX 10-YEAR PROBABILITY OF FRACTURE: FRAX not reported as the lowest BMD is not in the osteopenia range. IMPRESSION: Osteoporosis  based on BMD. Fracture risk is increased. Increased risk is based on low BMD. RECOMMENDATIONS: 1. All patients should optimize calcium  and vitamin D  intake. 2. Consider FDA-approved medical therapies in postmenopausal women and men aged 26 years and older, based on the following: - A hip or vertebral (clinical or morphometric) fracture - T-score less than or equal to -2.5 and secondary causes have been excluded. - Low bone mass (T-score between -1.0 and -2.5) and a 10-year probability of a hip fracture greater than or equal to 3% or a 10-year probability of a major osteoporosis-related fracture greater than or equal to 20% based on the [place] -adapted WHO algorithm. - Clinician judgment and/or patient preferences may indicate treatment for people with 10-year fracture probabilities above or below these levels 3. Patients with diagnosis of osteoporosis or at high risk for fracture should have regular bone mineral density tests. For patients eligible for Medicare, routine testing is allowed once every 2 years. The testing frequency can  be increased to one year for patients who have rapidly progressing disease, those who are receiving or discontinuing medical therapy to restore bone mass, or have additional risk factors. Electronically Signed   By: Alm Parkins M.D.   On: 04/17/2024 12:21    Assessment & Plan:  .Essential hypertension  Hyperlipidemia with target LDL less than 100  Impaired fasting glucose  Other fatigue     I spent 34 minutes on the day of this face to face encounter reviewing patient's  most recent visit with cardiology,  nephrology,  and neurology,  prior relevant surgical and non surgical procedures, recent  labs and imaging studies, counseling on weight management,  reviewing the assessment and plan with patient, and post visit ordering and reviewing of  diagnostics and therapeutics with patient  .   Follow-up: No follow-ups on file.   Melissa LITTIE Kettering, MD "

## 2024-07-01 NOTE — Patient Instructions (Addendum)
 Your calcium  supplementation is fine  Increase vitamin   to 2000 ius daily   Resume alendonate   weekly    For your back pain:   You can take 3000 mg of acetominophen (tylenol ) every day safely  In divided doses (750 mg  every 6 hours  Or 1000 mg every 8 hours.)

## 2024-07-02 LAB — COMPREHENSIVE METABOLIC PANEL WITH GFR
ALT: 20 U/L (ref 3–35)
AST: 22 U/L (ref 5–37)
Albumin: 4.4 g/dL (ref 3.5–5.2)
Alkaline Phosphatase: 80 U/L (ref 39–117)
BUN: 16 mg/dL (ref 6–23)
CO2: 31 meq/L (ref 19–32)
Calcium: 9.9 mg/dL (ref 8.4–10.5)
Chloride: 103 meq/L (ref 96–112)
Creatinine, Ser: 0.96 mg/dL (ref 0.40–1.20)
GFR: 56.75 mL/min — ABNORMAL LOW
Glucose, Bld: 91 mg/dL (ref 70–99)
Potassium: 5.4 meq/L — ABNORMAL HIGH (ref 3.5–5.1)
Sodium: 140 meq/L (ref 135–145)
Total Bilirubin: 0.4 mg/dL (ref 0.2–1.2)
Total Protein: 6.5 g/dL (ref 6.0–8.3)

## 2024-07-02 LAB — CBC WITH DIFFERENTIAL/PLATELET
Basophils Absolute: 0.1 K/uL (ref 0.0–0.1)
Basophils Relative: 1.5 % (ref 0.0–3.0)
Eosinophils Absolute: 0.3 K/uL (ref 0.0–0.7)
Eosinophils Relative: 4.9 % (ref 0.0–5.0)
HCT: 40.4 % (ref 36.0–46.0)
Hemoglobin: 13.9 g/dL (ref 12.0–15.0)
Lymphocytes Relative: 42.3 % (ref 12.0–46.0)
Lymphs Abs: 2.7 K/uL (ref 0.7–4.0)
MCHC: 34.4 g/dL (ref 30.0–36.0)
MCV: 89.8 fl (ref 78.0–100.0)
Monocytes Absolute: 0.7 K/uL (ref 0.1–1.0)
Monocytes Relative: 11.8 % (ref 3.0–12.0)
Neutro Abs: 2.5 K/uL (ref 1.4–7.7)
Neutrophils Relative %: 39.5 % — ABNORMAL LOW (ref 43.0–77.0)
Platelets: 211 K/uL (ref 150.0–400.0)
RBC: 4.5 Mil/uL (ref 3.87–5.11)
RDW: 12.5 % (ref 11.5–15.5)
WBC: 6.3 K/uL (ref 4.0–10.5)

## 2024-07-02 LAB — LIPID PANEL
Cholesterol: 197 mg/dL (ref 28–200)
HDL: 52.3 mg/dL
LDL Cholesterol: 116 mg/dL — ABNORMAL HIGH (ref 10–99)
NonHDL: 144.74
Total CHOL/HDL Ratio: 4
Triglycerides: 142 mg/dL (ref 10.0–149.0)
VLDL: 28.4 mg/dL (ref 0.0–40.0)

## 2024-07-02 LAB — MICROALBUMIN / CREATININE URINE RATIO
Creatinine,U: 103.1 mg/dL
Microalb Creat Ratio: UNDETERMINED mg/g (ref 0.0–30.0)
Microalb, Ur: <0.7 mg/dL

## 2024-07-02 LAB — HEMOGLOBIN A1C: Hgb A1c MFr Bld: 5.6 % (ref 4.6–6.5)

## 2024-07-02 LAB — TSH: TSH: 1.84 u[IU]/mL (ref 0.35–5.50)

## 2024-07-02 LAB — LDL CHOLESTEROL, DIRECT: Direct LDL: 128 mg/dL

## 2024-07-02 NOTE — Assessment & Plan Note (Addendum)
 Reviewed the risks and benefits of various forms of treatment.  Advised to resume  alendronate  , started in June 2023 after she sustained 4 thoracic vertebral fractures (T8 with 40% reduction in height).   And suspended because she was warned off the drug by her oral  surgeon du to the exaggerated  risk of OCN

## 2024-07-02 NOTE — Assessment & Plan Note (Signed)
 Well controlled on current regimen of losartan , metoprolol  and diltiazem . Repeat potassium needed  Lab Results  Component Value Date   CREATININE 0.96 07/01/2024   Lab Results  Component Value Date   NA 140 07/01/2024   K 5.4 No hemolysis seen (H) 07/01/2024   CL 103 07/01/2024   CO2 31 07/01/2024

## 2024-07-02 NOTE — Assessment & Plan Note (Signed)
Untreated due to patient preference.  

## 2024-07-02 NOTE — Assessment & Plan Note (Signed)
 She did not tolerate  high potency statin therapy more than 2/week,  was switched to zetia  in late May.  LDL is not at goal .    Will recommend PCSK9 inihibitor.    Lab Results  Component Value Date   CHOL 197 07/01/2024   HDL 52.30 07/01/2024   LDLCALC 116 (H) 07/01/2024   LDLDIRECT 128.0 07/01/2024   TRIG 142.0 07/01/2024   CHOLHDL 4 07/01/2024

## 2024-07-03 ENCOUNTER — Other Ambulatory Visit: Payer: Self-pay | Admitting: Internal Medicine

## 2024-07-03 ENCOUNTER — Ambulatory Visit: Payer: Self-pay | Admitting: Internal Medicine

## 2024-07-03 DIAGNOSIS — E875 Hyperkalemia: Secondary | ICD-10-CM

## 2024-07-04 MED ORDER — ROSUVASTATIN CALCIUM 20 MG PO TABS: 20.0000 mg | ORAL_TABLET | Freq: Every day | ORAL | 3 refills | Status: AC

## 2024-07-06 ENCOUNTER — Other Ambulatory Visit: Payer: Self-pay | Admitting: Internal Medicine

## 2024-07-06 MED ORDER — EZETIMIBE 10 MG PO TABS: 10.0000 mg | ORAL_TABLET | Freq: Every day | ORAL | 3 refills | Status: AC

## 2024-07-08 ENCOUNTER — Other Ambulatory Visit (INDEPENDENT_AMBULATORY_CARE_PROVIDER_SITE_OTHER)

## 2024-07-08 DIAGNOSIS — E875 Hyperkalemia: Secondary | ICD-10-CM

## 2024-07-09 LAB — BASIC METABOLIC PANEL WITH GFR
BUN: 18 mg/dL (ref 6–23)
CO2: 33 meq/L — ABNORMAL HIGH (ref 19–32)
Calcium: 9.6 mg/dL (ref 8.4–10.5)
Chloride: 102 meq/L (ref 96–112)
Creatinine, Ser: 1.25 mg/dL — ABNORMAL HIGH (ref 0.40–1.20)
GFR: 41.34 mL/min — ABNORMAL LOW
Glucose, Bld: 98 mg/dL (ref 70–99)
Potassium: 5.4 meq/L — ABNORMAL HIGH (ref 3.5–5.1)
Sodium: 141 meq/L (ref 135–145)

## 2024-07-10 ENCOUNTER — Other Ambulatory Visit: Payer: Self-pay | Admitting: Internal Medicine

## 2024-07-10 ENCOUNTER — Ambulatory Visit: Payer: Self-pay | Admitting: Internal Medicine

## 2024-07-10 DIAGNOSIS — I10 Essential (primary) hypertension: Secondary | ICD-10-CM

## 2024-07-10 DIAGNOSIS — E875 Hyperkalemia: Secondary | ICD-10-CM | POA: Insufficient documentation

## 2024-07-10 DIAGNOSIS — N179 Acute kidney failure, unspecified: Secondary | ICD-10-CM

## 2024-07-10 DIAGNOSIS — I701 Atherosclerosis of renal artery: Secondary | ICD-10-CM

## 2024-07-10 NOTE — Assessment & Plan Note (Signed)
 Persistent  with drop in GFR. . Will suspend losartan  and send for Renal artery doppler.

## 2024-07-15 NOTE — Telephone Encounter (Signed)
 Would you like to see pt before the renal doppler or after the renal doppler is completed?

## 2024-07-19 ENCOUNTER — Other Ambulatory Visit: Payer: Self-pay | Admitting: Internal Medicine

## 2024-07-31 ENCOUNTER — Ambulatory Visit

## 2024-10-14 ENCOUNTER — Ambulatory Visit: Admitting: Internal Medicine

## 2025-01-01 ENCOUNTER — Ambulatory Visit: Admitting: Internal Medicine

## 2025-03-24 ENCOUNTER — Ambulatory Visit
